# Patient Record
Sex: Female | Born: 1955 | Race: White | Hispanic: No | Marital: Single | Smoking: Former smoker
Health system: Southern US, Community
[De-identification: ages and names within clinical notes are randomized; demographics above are authoritative.]

## PROBLEM LIST (undated history)

## (undated) DIAGNOSIS — Z8619 Personal history of other infectious and parasitic diseases: Secondary | ICD-10-CM

## (undated) DIAGNOSIS — N200 Calculus of kidney: Secondary | ICD-10-CM

## (undated) DIAGNOSIS — F32A Depression, unspecified: Secondary | ICD-10-CM

## (undated) DIAGNOSIS — H9319 Tinnitus, unspecified ear: Secondary | ICD-10-CM

## (undated) DIAGNOSIS — H269 Unspecified cataract: Secondary | ICD-10-CM

## (undated) DIAGNOSIS — M1990 Unspecified osteoarthritis, unspecified site: Secondary | ICD-10-CM

## (undated) DIAGNOSIS — G4700 Insomnia, unspecified: Secondary | ICD-10-CM

## (undated) DIAGNOSIS — B182 Chronic viral hepatitis C: Secondary | ICD-10-CM

## (undated) DIAGNOSIS — F329 Major depressive disorder, single episode, unspecified: Secondary | ICD-10-CM

## (undated) DIAGNOSIS — F419 Anxiety disorder, unspecified: Secondary | ICD-10-CM

## (undated) HISTORY — PX: KIDNEY STONE SURGERY: SHX686

## (undated) HISTORY — DX: Anxiety disorder, unspecified: F41.9

## (undated) HISTORY — PX: CHOLECYSTECTOMY: SHX55

## (undated) HISTORY — DX: Insomnia, unspecified: G47.00

## (undated) HISTORY — PX: HEMORROIDECTOMY: SUR656

## (undated) HISTORY — PX: TEMPOROMANDIBULAR JOINT SURGERY: SHX35

## (undated) HISTORY — DX: Personal history of other infectious and parasitic diseases: Z86.19

---
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
## 2007-04-01 ENCOUNTER — Ambulatory Visit: Payer: Self-pay | Admitting: Obstetrics & Gynecology

## 2007-04-15 ENCOUNTER — Ambulatory Visit: Payer: Self-pay | Admitting: Gastroenterology

## 2008-01-29 ENCOUNTER — Emergency Department: Payer: Self-pay | Admitting: Emergency Medicine

## 2009-03-25 ENCOUNTER — Emergency Department: Payer: Self-pay | Admitting: Emergency Medicine

## 2009-03-31 ENCOUNTER — Ambulatory Visit: Payer: Self-pay | Admitting: Urology

## 2009-04-26 ENCOUNTER — Emergency Department: Payer: Self-pay | Admitting: Emergency Medicine

## 2009-05-09 ENCOUNTER — Emergency Department: Payer: Self-pay | Admitting: Emergency Medicine

## 2009-11-12 ENCOUNTER — Emergency Department: Payer: Self-pay | Admitting: Emergency Medicine

## 2010-02-02 ENCOUNTER — Ambulatory Visit: Payer: Self-pay

## 2010-04-25 DIAGNOSIS — B182 Chronic viral hepatitis C: Secondary | ICD-10-CM | POA: Insufficient documentation

## 2010-04-25 DIAGNOSIS — F32A Depression, unspecified: Secondary | ICD-10-CM | POA: Insufficient documentation

## 2010-04-25 HISTORY — DX: Chronic viral hepatitis C: B18.2

## 2010-06-22 ENCOUNTER — Emergency Department: Payer: Self-pay | Admitting: Emergency Medicine

## 2010-08-11 ENCOUNTER — Emergency Department: Payer: Self-pay | Admitting: Emergency Medicine

## 2010-09-15 ENCOUNTER — Emergency Department: Payer: Self-pay | Admitting: Emergency Medicine

## 2010-10-06 DIAGNOSIS — M75120 Complete rotator cuff tear or rupture of unspecified shoulder, not specified as traumatic: Secondary | ICD-10-CM | POA: Insufficient documentation

## 2011-04-11 DIAGNOSIS — M545 Low back pain, unspecified: Secondary | ICD-10-CM | POA: Insufficient documentation

## 2011-04-11 DIAGNOSIS — M542 Cervicalgia: Secondary | ICD-10-CM | POA: Insufficient documentation

## 2012-03-03 DIAGNOSIS — J069 Acute upper respiratory infection, unspecified: Secondary | ICD-10-CM | POA: Insufficient documentation

## 2012-03-03 HISTORY — DX: Acute upper respiratory infection, unspecified: J06.9

## 2012-11-18 ENCOUNTER — Emergency Department: Payer: Self-pay | Admitting: Emergency Medicine

## 2012-11-18 LAB — URINALYSIS, COMPLETE
Glucose,UR: NEGATIVE mg/dL (ref 0–75)
Ketone: NEGATIVE
Nitrite: NEGATIVE
Protein: 30
RBC,UR: 95 /HPF (ref 0–5)
Squamous Epithelial: 17
WBC UR: 38 /HPF (ref 0–5)

## 2012-11-18 LAB — COMPREHENSIVE METABOLIC PANEL
Alkaline Phosphatase: 90 U/L (ref 50–136)
Anion Gap: 4 — ABNORMAL LOW (ref 7–16)
BUN: 15 mg/dL (ref 7–18)
Bilirubin,Total: 0.6 mg/dL (ref 0.2–1.0)
Calcium, Total: 9.3 mg/dL (ref 8.5–10.1)
Chloride: 107 mmol/L (ref 98–107)
Co2: 30 mmol/L (ref 21–32)
Creatinine: 0.69 mg/dL (ref 0.60–1.30)
EGFR (Non-African Amer.): >60
Total Protein: 7.7 g/dL (ref 6.4–8.2)

## 2012-11-18 LAB — DRUG SCREEN, URINE
Amphetamines, Ur Screen: NEGATIVE (ref ?–1000)
Barbiturates, Ur Screen: NEGATIVE (ref ?–200)
Cannabinoid 50 Ng, Ur ~~LOC~~: NEGATIVE (ref ?–50)
Cocaine Metabolite,Ur ~~LOC~~: POSITIVE (ref ?–300)
Opiate, Ur Screen: NEGATIVE (ref ?–300)
Phencyclidine (PCP) Ur S: NEGATIVE (ref ?–25)

## 2012-11-18 LAB — CBC
HGB: 14.1 g/dL (ref 12.0–16.0)
MCH: 31.8 pg (ref 26.0–34.0)
MCHC: 33.8 g/dL (ref 32.0–36.0)
MCV: 94 fL (ref 80–100)
Platelet: 279 10*3/uL (ref 150–440)
RBC: 4.44 10*6/uL (ref 3.80–5.20)
RDW: 13.2 % (ref 11.5–14.5)

## 2012-11-18 LAB — ETHANOL: Ethanol %: <0.003 % (ref 0.000–0.080)

## 2012-12-10 ENCOUNTER — Ambulatory Visit: Payer: Self-pay | Admitting: Internal Medicine

## 2013-02-12 DIAGNOSIS — M19049 Primary osteoarthritis, unspecified hand: Secondary | ICD-10-CM | POA: Insufficient documentation

## 2013-03-14 ENCOUNTER — Ambulatory Visit: Payer: Self-pay | Admitting: Internal Medicine

## 2013-05-08 DIAGNOSIS — Z79890 Hormone replacement therapy (postmenopausal): Secondary | ICD-10-CM | POA: Insufficient documentation

## 2013-05-08 DIAGNOSIS — N951 Menopausal and female climacteric states: Secondary | ICD-10-CM | POA: Insufficient documentation

## 2013-05-08 DIAGNOSIS — N390 Urinary tract infection, site not specified: Secondary | ICD-10-CM | POA: Insufficient documentation

## 2013-12-26 DIAGNOSIS — F419 Anxiety disorder, unspecified: Secondary | ICD-10-CM | POA: Insufficient documentation

## 2014-03-21 ENCOUNTER — Emergency Department: Payer: Self-pay | Admitting: Internal Medicine

## 2014-11-06 NOTE — Consult Note (Signed)
PATIENT NAME:  Brooke Jenkins, Brooke Jenkins MR#:  [id number] DATE OF BIRTH:  [birth year]  DATE OF CONSULTATION:  11/19/2012  REFERRING PHYSICIAN:  Charlesetta Ivory, MD CONSULTING PHYSICIAN:  Cordelia Pen. Gretel Acre, MD  REASON FOR CONSULTATION: Depression.   CHIEF COMPLAINT: The patient is a 59 year old white female who presented to the Emergency Department requesting help. She reported "I cannot take this anymore, I take care of my 18 year old dad.  He has dementia. I cannot do it."  HISTORY OF PRESENT ILLNESS:  The patient is a 59 year old divorced female who presented to the ED after the IVC was taken out by her sister indicating that the patient has been abusing cocaine, about 10,000 dollars, in the past 3 months.  They reported the patient has been smashing out windows, writing on the walls and making threats to her sister. The patient reported that she takes care of her 62 year old father for the past 4 years. Reported that the father has been diagnosed with dementia and the sister who has recently moved in with them for the past 6 months is gone all day as she is currently a Pharmacist, hospital. The patient reported that she is unable to sleep at night and she is the primary caretaker of the father as he is unable to sleep at night and the patient is able to help him out with his daily chores. The patient reported that she cannot take it anymore as she has recently relapsed on cocaine and has been using heavily. She reported that she has been following with Dr. Kasandra Knudsen who has started heron new medications including giving her samples of antidepressant medications as well as giving her Xanax 1 mg 3 times a day. The patient reported that she feels that she is up all the time.  She cooks for the father as well as do his laundry. The patient reported that she fell 2 weeks ago and her memory was getting really bad. Reported that she gets cocaine from the drug dealer.  She reported "I have money." Reported that she gets disability for her back  as well as for depression of approximately 900 dollars per month. The patient reported that she is the baby of the family and lives with her "2 bitchy sisters." Reported that they think that she is an imbecile and is not able to take care of the father. Reported that the sister thinks that she is unable to take care of the father so they decided to bring her to the hospital. The patient appeared very disheveled during the interview and was unable to be calm and was very sleepy during the interview. She was unable to contract for safety at this time.   PAST PSYCHIATRIC HISTORY: The patient reported that she has been diagnosed with depression and has been following with Dr. Kasandra Knudsen at [facility] in [place]. Reported that she has been tried on several antidepressant medications including Wellbutrin, Paxil and Remeron which caused her constipation and he has recently started her on Fetzima. She was given samples of the medication. She reported that she is also getting Xanax 1 mg 3 times a day, but she does not take it 3 times a day.   ALLERGIES: CIPRO AND VICODIN.  MEDICAL PROBLEMS:  Significant weight loss.  SUBSTANCE ABUSE HISTORY: The patient has been using cocaine extensively and her last use was just before admission. She has spent approximately 10,000 dollars in the last 3 months.  Tobacco:  Smokes 1/2 pack of cigarettes per day.   FAMILY HISTORY: The  patient denied any history of psychiatric illness in her family.  CURRENT HOME MEDICATIONS: 1.  Pro Air 2.5 mg daily. 2.  Estradiol 1 mg daily. 3.  Xanax 1 mg 3 times daily. 4.  Fetzima samples.   REVIEW OF SYSTEMS:   GENERAL:  No fever or chills. Significant weight loss.  EYES: No double or blurred vision.  RESPIRATORY: No shortness of breath or cough.  CARDIOVASCULAR: No chest pain or orthopnea.  GASTROINTESTINAL: No abdominal pain, nausea, vomiting, diarrhea.  GENITOURINARY: No incontinence or frequency.  ENDOCRINE: No heat or cold intolerance.   LYMPHATIC: No anemia or easy bruising.  INTEGUMENTARY: No acne or rash.  MUSCULOSKELETAL: No muscle or joint pain.  NEUROLOGIC: No tingling or weakness.   CLINICAL SUMMARY:  Vital signs: Temperature 97.9, pulse 79, respirations 18, blood pressure 138/79.   LABORATORY DATA:  Glucose 97, BUN 15, creatinine 0.69, sodium 141, potassium 3.5, chloride 107, bicarbonate 30, anion gap 4, osmolality 282, calcium 9.3. Blood alcohol level less than 3. Protein 7.7, albumin 4.1, bilirubin 0.6, AST 63, ALT 80. TSH 1.07. Urine drug screen positive for benzodiazepines and cocaine. WBC 8.7, RBC 4.4, hemoglobin 14.1, hematocrit 41.8, platelet count 279, MCV 94, MCH 31.8.   MENTAL STATUS EXAMINATION: The patient is a skinny looking female who was very restless and sleepy during the interview. Her eye contact was fair. Her mood was anxious. Affect was congruent. Thought process was tangential. Thought content was nondelusional. She was unable to contract for safety at this time.  She denied having any perceptual disturbances.   DIAGNOSTIC IMPRESSION:  AXIS I: 1.  Major depressive disorder, recurrent, moderate.  2.  Cocaine dependence.   AXIS II: None.   AXIS III: Significant weight loss.   TREATMENT PLAN: 1.  The patient is currently under involuntary commitment.  2.  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  3.  She will be started on Zyprexa 2.5 mg p.o. b.i.d., Cogentin 0.5 mg p.o. b.i.d. and alprazolam 0.25 mg p.o. b.i.d. for her mood stabilization at this time.  4.  Treatment team to follow.   Thank you for allowing me to participate in the care of this patient.  ____________________________ Cordelia Pen. Gretel Acre, MD usf:sb D: 11/19/2012 13:46:33 ET T: 11/19/2012 14:09:28 ET JOB#: [id number]  cc: Cordelia Pen. Gretel Acre, MD, <Dictator> Jeronimo Norma MD ELECTRONICALLY SIGNED 11/22/2012 9:02

## 2014-12-17 ENCOUNTER — Emergency Department: Payer: Medicare Other

## 2014-12-17 ENCOUNTER — Emergency Department
Admission: EM | Admit: 2014-12-17 | Discharge: 2014-12-18 | Disposition: A | Discharge status: Home / Self Care | Payer: Medicare Other | Attending: Emergency Medicine | Admitting: Emergency Medicine

## 2014-12-17 DIAGNOSIS — W01190A Fall on same level from slipping, tripping and stumbling with subsequent striking against furniture, initial encounter: Secondary | ICD-10-CM | POA: Diagnosis not present

## 2014-12-17 DIAGNOSIS — Y998 Other external cause status: Secondary | ICD-10-CM | POA: Insufficient documentation

## 2014-12-17 DIAGNOSIS — Y9301 Activity, walking, marching and hiking: Secondary | ICD-10-CM | POA: Diagnosis not present

## 2014-12-17 DIAGNOSIS — Y92009 Unspecified place in unspecified non-institutional (private) residence as the place of occurrence of the external cause: Secondary | ICD-10-CM | POA: Insufficient documentation

## 2014-12-17 DIAGNOSIS — Z23 Encounter for immunization: Secondary | ICD-10-CM | POA: Insufficient documentation

## 2014-12-17 DIAGNOSIS — S51012A Laceration without foreign body of left elbow, initial encounter: Secondary | ICD-10-CM | POA: Insufficient documentation

## 2014-12-17 DIAGNOSIS — S59902A Unspecified injury of left elbow, initial encounter: Secondary | ICD-10-CM | POA: Diagnosis present

## 2014-12-17 MED ORDER — TETANUS-DIPHTH-ACELL PERTUSSIS 5-2.5-18.5 LF-MCG/0.5 IM SUSP
0.5000 mL | Freq: Once | INTRAMUSCULAR | Status: AC
  Administered 2014-12-17: 0.5 mL via INTRAMUSCULAR

## 2014-12-17 MED ORDER — TETANUS-DIPHTH-ACELL PERTUSSIS 5-2.5-18.5 LF-MCG/0.5 IM SUSP
INTRAMUSCULAR | Status: AC
  Administered 2014-12-17: 0.5 mL via INTRAMUSCULAR
  Filled 2014-12-17: qty 0.5

## 2014-12-17 MED ORDER — LIDOCAINE-EPINEPHRINE 2 %-1:100000 IJ SOLN: 20.0000 mL | Freq: Once | INTRAMUSCULAR | Status: AC

## 2014-12-17 MED ORDER — OXYCODONE-ACETAMINOPHEN 5-325 MG PO TABS
ORAL_TABLET | ORAL | Status: AC
  Administered 2014-12-17: 2 via ORAL
  Filled 2014-12-17: qty 2

## 2014-12-17 MED ORDER — LIDOCAINE-EPINEPHRINE (PF) 1 %-1:200000 IJ SOLN
INTRAMUSCULAR | Status: AC
  Administered 2014-12-17: 30 mL
  Filled 2014-12-17: qty 30

## 2014-12-17 MED ORDER — IBUPROFEN 600 MG PO TABS
ORAL_TABLET | ORAL | Status: AC
  Administered 2014-12-17: 600 mg
  Filled 2014-12-17: qty 1

## 2014-12-17 MED ORDER — OXYCODONE-ACETAMINOPHEN 5-325 MG PO TABS
2.0000 | ORAL_TABLET | Freq: Once | ORAL | Status: AC
  Administered 2014-12-17: 2 via ORAL

## 2014-12-17 NOTE — ED Notes (Signed)
Patient transported to X-ray 

## 2014-12-17 NOTE — ED Provider Notes (Signed)
CSN: [id number]     Arrival date & time 12/17/14  2151 History   First MD Initiated Contact with Patient 12/17/14 2204     Chief Complaint  Patient presents with  . Fall     (Consider location/radiation/quality/duration/timing/severity/associated sxs/prior Treatment) The history is provided by the patient.  Brooke Jenkins is a 59 y.o. female history of fibromyalgia here presenting with fall. Patient was walking in her house and accidentally tripped over something and hit the left elbow on a piece of furniture. She noticed bleeding from the left elbow afterwards. She did hit her head on the back but denies any headaches and is not currently on any blood thinners. Patient also has some right forearm pain as well. Denies syncope or loss of consciousness.    No past medical history on file. No past surgical history on file. No family history on file. History  Substance Use Topics  . Smoking status: Not on file  . Smokeless tobacco: Not on file  . Alcohol Use: Not on file   OB History    No data available     Review of Systems  Musculoskeletal:       L elbow pain   Skin: Positive for wound.  All other systems reviewed and are negative.     Allergies  Vicodin  Home Medications   Prior to Admission medications   Not on File   BP 149/77 mmHg  Pulse 77  Temp(Src) 97.8 F (36.6 C) (Oral)  Resp 18  Ht 5' (1.524 m)  Wt 105 lb (47.628 kg)  BMI 20.51 kg/m2  SpO2 98% Physical Exam  Constitutional: She is oriented to person, place, and time. She appears well-developed and well-nourished.  HENT:  Head: Normocephalic.  Mouth/Throat: Oropharynx is clear and moist.  Small posterior scalp hematoma. No active bleeding   Eyes: Conjunctivae are normal. Pupils are equal, round, and reactive to light.  Neck: Normal range of motion. Neck supple.  Cardiovascular: Normal rate, regular rhythm and normal heart sounds.   Pulmonary/Chest: Effort normal and breath sounds normal. No  respiratory distress. She has no wheezes. She has no rales.  Abdominal: Soft. Bowel sounds are normal. She exhibits no distension. There is no tenderness. There is no rebound.  Musculoskeletal: Normal range of motion.  L elbow with complex lacerations. One laceration around 1 in the other 2 inches, with active bleeding. Mild tenderness L forearm and R forearm, no obvious deformity.   Neurological: She is alert and oriented to person, place, and time. No cranial nerve deficit. Coordination normal.  Skin: Skin is warm and dry.  Psychiatric: She has a normal mood and affect. Her behavior is normal. Judgment and thought content normal.  Nursing note and vitals reviewed.   ED Course  Procedures (including critical care time) Labs Review Labs Reviewed - No data to display  Imaging Review No results found.   EKG Interpretation None      MDM   Final diagnoses:  None   [name] is a 59 y.o. female here with fall with laceration. Will get xray to r/o foreign body. Will need to suture laceration. Tdap updated.   10:58 PM Xray pending. Signed out to Dr. Owens Shark to f/u xray and suture laceration.   Brooke Arthurs, MD 12/17/14 2258

## 2014-12-17 NOTE — ED Notes (Signed)
[address] and hit left elbow on possible furniture large amt of bleeding noted to left elbow with large amt of bleeding.

## 2014-12-18 MED ORDER — KETOROLAC TROMETHAMINE 10 MG PO TABS
10.0000 mg | ORAL_TABLET | Freq: Once | ORAL | Status: AC
  Administered 2014-12-18: 10 mg via ORAL

## 2014-12-18 MED ORDER — KETOROLAC TROMETHAMINE 10 MG PO TABS
ORAL_TABLET | ORAL | Status: AC
  Administered 2014-12-18: 10 mg via ORAL
  Filled 2014-12-18: qty 1

## 2014-12-18 MED ORDER — IBUPROFEN 600 MG PO TABS: 600.0000 mg | ORAL_TABLET | Freq: Three times a day (TID) | ORAL | Status: DC | PRN

## 2014-12-18 NOTE — ED Notes (Signed)
Left elbow sutured by md.  Pt tolerated well.  Pressure bandage applied to left elbow.

## 2014-12-18 NOTE — ED Provider Notes (Signed)
Assume care from Dr. Darl Householder all x-rays that were for performed reveals no fracture or dislocation. Patient's wound was repaired. Procedure note: Approximately 3 inch laceration to the left elbow that was Y shaped cleaned with Betadine and normal saline anesthetized with 10 cc of 1% lidocaine with epinephrine. Wound repaired under sterile procedure with 5-0 nylon. Total number of stitches [place], MD 12/18/14 [phone]

## 2014-12-18 NOTE — Discharge Instructions (Signed)

## 2015-12-07 DIAGNOSIS — G4700 Insomnia, unspecified: Secondary | ICD-10-CM | POA: Insufficient documentation

## 2015-12-07 DIAGNOSIS — F5104 Psychophysiologic insomnia: Secondary | ICD-10-CM | POA: Insufficient documentation

## 2015-12-07 DIAGNOSIS — K5904 Chronic idiopathic constipation: Secondary | ICD-10-CM | POA: Insufficient documentation

## 2015-12-07 DIAGNOSIS — F3341 Major depressive disorder, recurrent, in partial remission: Secondary | ICD-10-CM | POA: Insufficient documentation

## 2015-12-07 DIAGNOSIS — K219 Gastro-esophageal reflux disease without esophagitis: Secondary | ICD-10-CM | POA: Insufficient documentation

## 2015-12-07 DIAGNOSIS — F17200 Nicotine dependence, unspecified, uncomplicated: Secondary | ICD-10-CM | POA: Insufficient documentation

## 2015-12-10 DIAGNOSIS — F988 Other specified behavioral and emotional disorders with onset usually occurring in childhood and adolescence: Secondary | ICD-10-CM | POA: Insufficient documentation

## 2016-01-12 DIAGNOSIS — M5030 Other cervical disc degeneration, unspecified cervical region: Secondary | ICD-10-CM | POA: Insufficient documentation

## 2016-01-12 DIAGNOSIS — H9313 Tinnitus, bilateral: Secondary | ICD-10-CM | POA: Insufficient documentation

## 2016-02-17 ENCOUNTER — Other Ambulatory Visit (HOSPITAL_COMMUNITY): Payer: Self-pay | Admitting: Nurse Practitioner

## 2016-02-17 ENCOUNTER — Other Ambulatory Visit: Payer: Self-pay | Admitting: Nurse Practitioner

## 2016-02-17 DIAGNOSIS — R1310 Dysphagia, unspecified: Secondary | ICD-10-CM

## 2016-02-24 ENCOUNTER — Ambulatory Visit: Payer: Medicare Other

## 2016-03-02 ENCOUNTER — Ambulatory Visit
Admission: RE | Admit: 2016-03-02 | Discharge: 2016-03-02 | Disposition: A | Discharge status: Home / Self Care | Payer: Medicare Other | Source: Ambulatory Visit | Attending: Nurse Practitioner | Admitting: Nurse Practitioner

## 2016-03-02 DIAGNOSIS — R1310 Dysphagia, unspecified: Secondary | ICD-10-CM

## 2016-03-02 DIAGNOSIS — K5909 Other constipation: Secondary | ICD-10-CM | POA: Diagnosis present

## 2016-03-28 DIAGNOSIS — Z87442 Personal history of urinary calculi: Secondary | ICD-10-CM

## 2016-03-28 DIAGNOSIS — N200 Calculus of kidney: Secondary | ICD-10-CM | POA: Insufficient documentation

## 2016-03-28 HISTORY — DX: Personal history of urinary calculi: Z87.442

## 2016-05-15 ENCOUNTER — Ambulatory Visit: Admit: 2016-05-15 | Payer: Medicare Other | Admitting: Gastroenterology

## 2016-05-15 SURGERY — ESOPHAGOGASTRODUODENOSCOPY (EGD) WITH PROPOFOL
Anesthesia: General

## 2016-10-09 ENCOUNTER — Emergency Department
Admission: EM | Admit: 2016-10-09 | Discharge: 2016-10-09 | Disposition: A | Discharge status: Home / Self Care | Payer: Medicare Other | Attending: Emergency Medicine | Admitting: Emergency Medicine

## 2016-10-09 ENCOUNTER — Encounter: Payer: Self-pay | Admitting: *Deleted

## 2016-10-09 DIAGNOSIS — R1031 Right lower quadrant pain: Secondary | ICD-10-CM | POA: Diagnosis not present

## 2016-10-09 DIAGNOSIS — F17200 Nicotine dependence, unspecified, uncomplicated: Secondary | ICD-10-CM | POA: Insufficient documentation

## 2016-10-09 DIAGNOSIS — Z5321 Procedure and treatment not carried out due to patient leaving prior to being seen by health care provider: Secondary | ICD-10-CM | POA: Diagnosis not present

## 2016-10-09 LAB — COMPREHENSIVE METABOLIC PANEL
ALT: 14 U/L (ref 14–54)
ANION GAP: 6 (ref 5–15)
AST: 29 U/L (ref 15–41)
Albumin: 3.9 g/dL (ref 3.5–5.0)
Alkaline Phosphatase: 78 U/L (ref 38–126)
BUN: 9 mg/dL (ref 6–20)
CALCIUM: 8.8 mg/dL — AB (ref 8.9–10.3)
CO2: 29 mmol/L (ref 22–32)
CREATININE: 0.66 mg/dL (ref 0.44–1.00)
Chloride: 106 mmol/L (ref 101–111)
GFR calc non Af Amer: >60 mL/min (ref >60)
Glucose, Bld: 99 mg/dL (ref 65–99)
Potassium: 4.3 mmol/L (ref 3.5–5.1)
SODIUM: 141 mmol/L (ref 135–145)
Total Bilirubin: 0.5 mg/dL (ref 0.3–1.2)
Total Protein: 7.2 g/dL (ref 6.5–8.1)

## 2016-10-09 LAB — CBC
HCT: 38.8 % (ref 35.0–47.0)
HEMOGLOBIN: 13.1 g/dL (ref 12.0–16.0)
MCH: 32.6 pg (ref 26.0–34.0)
MCHC: 33.8 g/dL (ref 32.0–36.0)
MCV: 96.5 fL (ref 80.0–100.0)
Platelets: 229 10*3/uL (ref 150–440)
RBC: 4.01 MIL/uL (ref 3.80–5.20)
RDW: 13 % (ref 11.5–14.5)
WBC: 6.4 10*3/uL (ref 3.6–11.0)

## 2016-10-09 LAB — URINALYSIS, COMPLETE (UACMP) WITH MICROSCOPIC
BILIRUBIN URINE: NEGATIVE
Bacteria, UA: NONE SEEN
Glucose, UA: NEGATIVE mg/dL
KETONES UR: NEGATIVE mg/dL
Leukocytes, UA: NEGATIVE
Nitrite: NEGATIVE
Protein, ur: NEGATIVE mg/dL
SPECIFIC GRAVITY, URINE: 1.003 — AB (ref 1.005–1.030)
Squamous Epithelial / LPF: NONE SEEN
pH: 7 (ref 5.0–8.0)

## 2016-10-09 LAB — LIPASE, BLOOD: Lipase: 14 U/L (ref 11–51)

## 2016-10-09 NOTE — ED Triage Notes (Signed)
Pt has right side abd pain for 1 week.  Pt states recent kidney stone but pain is different.  Pain worse today in right lower abd.  No back pain.  No n/v/

## 2016-10-10 ENCOUNTER — Telehealth: Payer: Self-pay | Admitting: Emergency Medicine

## 2016-10-10 NOTE — Telephone Encounter (Signed)
Called patient due to lwot to inquire about condition and follow up plans. Left message.   

## 2017-05-17 ENCOUNTER — Encounter: Payer: Self-pay | Admitting: Emergency Medicine

## 2017-05-17 ENCOUNTER — Emergency Department
Admission: EM | Admit: 2017-05-17 | Discharge: 2017-05-17 | Disposition: A | Discharge status: Home / Self Care | Payer: Medicare Other | Attending: Emergency Medicine | Admitting: Emergency Medicine

## 2017-05-17 ENCOUNTER — Emergency Department: Payer: Medicare Other

## 2017-05-17 DIAGNOSIS — F17200 Nicotine dependence, unspecified, uncomplicated: Secondary | ICD-10-CM | POA: Diagnosis not present

## 2017-05-17 DIAGNOSIS — G43009 Migraine without aura, not intractable, without status migrainosus: Secondary | ICD-10-CM

## 2017-05-17 DIAGNOSIS — Y9389 Activity, other specified: Secondary | ICD-10-CM | POA: Insufficient documentation

## 2017-05-17 DIAGNOSIS — Y998 Other external cause status: Secondary | ICD-10-CM | POA: Insufficient documentation

## 2017-05-17 DIAGNOSIS — Y92000 Kitchen of unspecified non-institutional (private) residence as  the place of occurrence of the external cause: Secondary | ICD-10-CM | POA: Insufficient documentation

## 2017-05-17 DIAGNOSIS — F329 Major depressive disorder, single episode, unspecified: Secondary | ICD-10-CM | POA: Diagnosis not present

## 2017-05-17 DIAGNOSIS — S060X9A Concussion with loss of consciousness of unspecified duration, initial encounter: Secondary | ICD-10-CM

## 2017-05-17 DIAGNOSIS — W228XXA Striking against or struck by other objects, initial encounter: Secondary | ICD-10-CM | POA: Diagnosis not present

## 2017-05-17 DIAGNOSIS — S0990XA Unspecified injury of head, initial encounter: Secondary | ICD-10-CM | POA: Diagnosis present

## 2017-05-17 DIAGNOSIS — Z79899 Other long term (current) drug therapy: Secondary | ICD-10-CM | POA: Insufficient documentation

## 2017-05-17 HISTORY — DX: Unspecified cataract: H26.9

## 2017-05-17 HISTORY — DX: Depression, unspecified: F32.A

## 2017-05-17 HISTORY — DX: Unspecified osteoarthritis, unspecified site: M19.90

## 2017-05-17 HISTORY — DX: Tinnitus, unspecified ear: H93.19

## 2017-05-17 HISTORY — DX: Major depressive disorder, single episode, unspecified: F32.9

## 2017-05-17 MED ORDER — KETOROLAC TROMETHAMINE 30 MG/ML IJ SOLN
30.0000 mg | Freq: Once | INTRAMUSCULAR | Status: AC
  Administered 2017-05-17: 30 mg via INTRAVENOUS
  Filled 2017-05-17: qty 1

## 2017-05-17 MED ORDER — DIPHENHYDRAMINE HCL 50 MG/ML IJ SOLN
50.0000 mg | Freq: Once | INTRAMUSCULAR | Status: AC
  Administered 2017-05-17: 50 mg via INTRAVENOUS
  Filled 2017-05-17: qty 1

## 2017-05-17 MED ORDER — METOCLOPRAMIDE HCL 5 MG/ML IJ SOLN
10.0000 mg | Freq: Once | INTRAMUSCULAR | Status: AC
  Administered 2017-05-17: 10 mg via INTRAVENOUS
  Filled 2017-05-17: qty 2

## 2017-05-17 MED ORDER — SODIUM CHLORIDE 0.9 % IV BOLUS (SEPSIS)
1000.0000 mL | Freq: Once | INTRAVENOUS | Status: AC
  Administered 2017-05-17: 1000 mL via INTRAVENOUS

## 2017-05-17 NOTE — ED Provider Notes (Signed)
[facility] Emergency Department Provider Note  Time seen: 5:06 PM  I have reviewed the triage vital signs and the nursing notes.   HISTORY  Chief Complaint Head Injury; Headache; and Nausea    HPI Brooke Jenkins is a 61 y.o. female with a past medical history of cataracts, depression, presents to the emergency department for headache.  According to the patient proximate 10 days ago she was putting away things in the refrigerator when she stood up hitting the top of her head on the freezer door.  Denies LOC but states significant pain with a large sized hematoma.  Patient states since that time she has had progressively worsening headache currently rates her headache is a 9/10.  Positive for photophobia.  Patient has a history of migraines but states she has not had a migraine in many years.  Denies any weakness or numbness.  Past Medical History:  Diagnosis Date  . Arthritis   . Cataracts, bilateral   . Depression   . Tinnitus     There are no active problems to display for this patient.   No past surgical history on file.  Prior to Admission medications   Medication Sig Start Date End Date Taking? Authorizing Provider  ibuprofen (ADVIL,MOTRIN) 600 MG tablet Take 1 tablet (600 mg total) by mouth every 8 (eight) hours as needed. 12/18/14   Gregor Hams, MD  lamoTRIgine (LAMICTAL) 150 MG tablet Take 150 mg by mouth daily.    [provider]  methylphenidate (RITALIN) 20 MG tablet Take 20 mg by mouth.    [provider]  omeprazole (PRILOSEC) 20 MG capsule Take 20 mg by mouth daily.    [provider]    Allergies  Allergen Reactions  . Ciprofloxacin Nausea And Vomiting  . Vicodin [Hydrocodone-Acetaminophen] Itching    No family history on file.  Social History Social History  Substance Use Topics  . Smoking status: Current Every Day Smoker  . Smokeless tobacco: Never Used  . Alcohol use No    Review of  Systems Constitutional: Negative for LOC. Cardiovascular: Negative for chest pain. Respiratory: Negative for shortness of breath. Gastrointestinal: Negative for abdominal pain Skin: States hematoma to the back of her head which has since resolved Neurological: Significant headache.  Denies focal weakness or numbness. All other ROS negative  ____________________________________________   PHYSICAL EXAM:  VITAL SIGNS: ED Triage Vitals  Enc Vitals Group     BP 05/17/17 1455 (!) 144/70     Pulse Rate 05/17/17 1455 (!) 58     Resp 05/17/17 1455 16     Temp 05/17/17 1455 98.2 F (36.8 C)     Temp Source 05/17/17 1455 Oral     SpO2 05/17/17 1455 98 %     Weight 05/17/17 1456 105 lb (47.6 kg)     Height 05/17/17 1456 5' (1.524 m)     Head Circumference --      Peak Flow --      Pain Score 05/17/17 1458 9     Pain Loc --      Pain Edu? --      Excl. in [place]? --    Constitutional: Alert and oriented. Well appearing and in no distress. Eyes: Normal exam ENT   Head: Normocephalic and atraumatic.  No hematoma palpated.   Mouth/Throat: Mucous membranes are moist. Cardiovascular: Normal rate, regular rhythm. No murmur Respiratory: Normal respiratory effort without tachypnea nor retractions. Breath sounds are clear Gastrointestinal: Soft and nontender.  No distention. Musculoskeletal: Nontender with normal range of motion in all extremities Neurologic:  Normal speech and language. No gross focal neurologic deficits.  Equal grip strength.  No pronator drift.  Cranial nerves appear intact Skin:  Skin is warm, dry and intact.  Psychiatric: Mood and affect are normal.   ____________________________________________    RADIOLOGY  CT scans are negative  INITIAL IMPRESSION / ASSESSMENT AND PLAN / ED COURSE  Pertinent labs & imaging results that were available during my care of the patient were reviewed by me and considered in my medical decision making (see chart for  details).  Patient presents the emergency department with significant headache over the past 10 days which has progressively worsened.  Differential would include concussion, intracranial hemorrhage, headache, migraine headache.  Patient CT scans of the head and neck are essentially negative/normal.  Given the patient's history of migraines patient possibly has a migraine induced from the head injury or possible concussive syndrome.  Overall the patient appears well, no neurological deficits on exam.  We will treat with Toradol, Reglan, Benadryl and IV fluids and reassess.  Patient agreeable with this plan of care.  Patient states significant improvement in the headache, she is feeling better.  We will discharge home with PCP follow-up.  Patient agreeable to this plan of care.  ____________________________________________   FINAL CLINICAL IMPRESSION(S) / ED DIAGNOSES  Head injury Headache    Harvest Dark, MD 05/17/17 1939

## 2017-05-17 NOTE — ED Notes (Signed)
Pt ambulatory to toilet with steady gait noted. No assistance needed.

## 2017-05-17 NOTE — ED Triage Notes (Addendum)
Pt reports hit her head on her freezer door ten days ago. Denies LOC but reports the pain brought her to her knees. Pt reports has had headache and nausea since. Pt alert and oriented in triage. Denies taking blood thinners.

## 2017-05-17 NOTE — ED Notes (Signed)
Pt states she feels dizzy and lightheaded when standing. Pt feels like she needs to hold something when standing to stabilize herself and maintain balance.

## 2017-10-29 DIAGNOSIS — G8929 Other chronic pain: Secondary | ICD-10-CM | POA: Insufficient documentation

## 2017-11-06 DIAGNOSIS — M94269 Chondromalacia, unspecified knee: Secondary | ICD-10-CM | POA: Insufficient documentation

## 2017-11-13 DIAGNOSIS — J301 Allergic rhinitis due to pollen: Secondary | ICD-10-CM | POA: Insufficient documentation

## 2018-08-08 ENCOUNTER — Other Ambulatory Visit: Payer: Self-pay | Admitting: Sports Medicine

## 2018-08-08 DIAGNOSIS — M222X2 Patellofemoral disorders, left knee: Secondary | ICD-10-CM

## 2018-08-08 DIAGNOSIS — M94261 Chondromalacia, right knee: Secondary | ICD-10-CM

## 2018-08-08 DIAGNOSIS — M7651 Patellar tendinitis, right knee: Secondary | ICD-10-CM

## 2018-08-08 DIAGNOSIS — G8929 Other chronic pain: Secondary | ICD-10-CM

## 2018-08-08 DIAGNOSIS — M7652 Patellar tendinitis, left knee: Secondary | ICD-10-CM

## 2018-08-08 DIAGNOSIS — M25561 Pain in right knee: Principal | ICD-10-CM

## 2018-08-08 DIAGNOSIS — M222X1 Patellofemoral disorders, right knee: Secondary | ICD-10-CM

## 2018-09-10 ENCOUNTER — Other Ambulatory Visit: Payer: Self-pay | Admitting: Medical Oncology

## 2018-09-10 DIAGNOSIS — R399 Unspecified symptoms and signs involving the genitourinary system: Secondary | ICD-10-CM

## 2018-09-10 DIAGNOSIS — N200 Calculus of kidney: Secondary | ICD-10-CM

## 2018-09-26 ENCOUNTER — Other Ambulatory Visit: Payer: Self-pay

## 2018-09-26 ENCOUNTER — Ambulatory Visit
Admission: RE | Admit: 2018-09-26 | Discharge: 2018-09-26 | Disposition: A | Discharge status: Home / Self Care | Payer: Medicare HMO | Source: Ambulatory Visit | Attending: Medical Oncology | Admitting: Medical Oncology

## 2018-09-26 DIAGNOSIS — N200 Calculus of kidney: Secondary | ICD-10-CM | POA: Diagnosis present

## 2018-09-26 DIAGNOSIS — R399 Unspecified symptoms and signs involving the genitourinary system: Secondary | ICD-10-CM | POA: Insufficient documentation

## 2018-09-27 ENCOUNTER — Other Ambulatory Visit: Payer: Self-pay | Admitting: Pediatrics

## 2018-09-27 DIAGNOSIS — R102 Pelvic and perineal pain: Secondary | ICD-10-CM

## 2018-09-27 DIAGNOSIS — R140 Abdominal distension (gaseous): Secondary | ICD-10-CM

## 2018-09-27 DIAGNOSIS — N951 Menopausal and female climacteric states: Secondary | ICD-10-CM

## 2018-10-01 ENCOUNTER — Other Ambulatory Visit: Payer: Self-pay | Admitting: Sports Medicine

## 2018-10-01 DIAGNOSIS — M94262 Chondromalacia, left knee: Secondary | ICD-10-CM

## 2018-10-01 DIAGNOSIS — M25562 Pain in left knee: Principal | ICD-10-CM

## 2018-10-01 DIAGNOSIS — G8929 Other chronic pain: Secondary | ICD-10-CM

## 2018-10-07 ENCOUNTER — Other Ambulatory Visit: Payer: Self-pay | Admitting: Physical Medicine and Rehabilitation

## 2018-10-07 DIAGNOSIS — M5416 Radiculopathy, lumbar region: Secondary | ICD-10-CM

## 2018-10-07 DIAGNOSIS — M5412 Radiculopathy, cervical region: Secondary | ICD-10-CM

## 2018-10-08 ENCOUNTER — Ambulatory Visit: Payer: Medicare HMO

## 2018-10-10 ENCOUNTER — Ambulatory Visit: Payer: Medicare HMO

## 2018-10-11 ENCOUNTER — Other Ambulatory Visit: Payer: Self-pay

## 2018-10-11 ENCOUNTER — Ambulatory Visit
Admission: RE | Admit: 2018-10-11 | Discharge: 2018-10-11 | Disposition: A | Discharge status: Home / Self Care | Payer: Medicare HMO | Source: Ambulatory Visit | Attending: Pediatrics | Admitting: Pediatrics

## 2018-10-11 DIAGNOSIS — R140 Abdominal distension (gaseous): Secondary | ICD-10-CM | POA: Diagnosis present

## 2018-10-11 DIAGNOSIS — R102 Pelvic and perineal pain: Secondary | ICD-10-CM | POA: Diagnosis present

## 2018-10-18 DIAGNOSIS — N952 Postmenopausal atrophic vaginitis: Secondary | ICD-10-CM | POA: Insufficient documentation

## 2018-10-30 ENCOUNTER — Telehealth: Payer: Self-pay | Admitting: Urology

## 2018-10-30 NOTE — Telephone Encounter (Signed)
Spoke with Dr. Erlene Quan ok to move appointment up to next week, patient was notified and appointment was rescheduled

## 2018-10-30 NOTE — Telephone Encounter (Signed)
Pt thinks she has kidney stones.  She cannot empty her bladder.  She is miserable and would like to come in sooner for her appt.  She goes to bathroom every 10 minutes, she can't sleep, she can't relax and has to force urine out and bear down.

## 2018-11-05 ENCOUNTER — Other Ambulatory Visit: Payer: Self-pay

## 2018-11-05 ENCOUNTER — Ambulatory Visit
Admission: RE | Admit: 2018-11-05 | Discharge: 2018-11-05 | Disposition: A | Discharge status: Home / Self Care | Payer: Medicare HMO | Source: Ambulatory Visit | Attending: Urology | Admitting: Urology

## 2018-11-05 ENCOUNTER — Encounter: Payer: Self-pay | Admitting: Urology

## 2018-11-05 ENCOUNTER — Ambulatory Visit: Payer: Medicare Other

## 2018-11-05 ENCOUNTER — Ambulatory Visit
Admission: RE | Admit: 2018-11-05 | Discharge: 2018-11-05 | Disposition: A | Discharge status: Home / Self Care | Payer: Medicare HMO | Attending: Urology | Admitting: Urology

## 2018-11-05 ENCOUNTER — Ambulatory Visit (INDEPENDENT_AMBULATORY_CARE_PROVIDER_SITE_OTHER): Payer: Medicare HMO | Admitting: Urology

## 2018-11-05 VITALS — BP 128/89 | HR 80 | Ht 60.0 in | Wt 121.6 lb

## 2018-11-05 DIAGNOSIS — N1330 Unspecified hydronephrosis: Secondary | ICD-10-CM

## 2018-11-05 DIAGNOSIS — Z87442 Personal history of urinary calculi: Secondary | ICD-10-CM

## 2018-11-05 DIAGNOSIS — R300 Dysuria: Secondary | ICD-10-CM | POA: Diagnosis not present

## 2018-11-05 DIAGNOSIS — N200 Calculus of kidney: Secondary | ICD-10-CM | POA: Diagnosis not present

## 2018-11-05 DIAGNOSIS — R102 Pelvic and perineal pain: Secondary | ICD-10-CM

## 2018-11-05 DIAGNOSIS — R350 Frequency of micturition: Secondary | ICD-10-CM | POA: Diagnosis not present

## 2018-11-05 DIAGNOSIS — F17200 Nicotine dependence, unspecified, uncomplicated: Secondary | ICD-10-CM | POA: Insufficient documentation

## 2018-11-05 LAB — URINALYSIS, COMPLETE
Bilirubin, UA: NEGATIVE
Glucose, UA: NEGATIVE
Ketones, UA: NEGATIVE
Nitrite, UA: NEGATIVE
Protein,UA: NEGATIVE
Specific Gravity, UA: 1.02 (ref 1.005–1.030)
Urobilinogen, Ur: 0.2 mg/dL (ref 0.2–1.0)
pH, UA: 6.5 (ref 5.0–7.5)

## 2018-11-05 LAB — BLADDER SCAN AMB NON-IMAGING

## 2018-11-05 LAB — MICROSCOPIC EXAMINATION

## 2018-11-05 MED ORDER — OXYCODONE-ACETAMINOPHEN 5-325 MG PO TABS: 1.0000 | ORAL_TABLET | Freq: Four times a day (QID) | ORAL | 0 refills | Status: DC | PRN

## 2018-11-05 MED ORDER — SULFAMETHOXAZOLE-TRIMETHOPRIM 800-160 MG PO TABS: 1.0000 | ORAL_TABLET | Freq: Two times a day (BID) | ORAL | 0 refills | Status: DC

## 2018-11-05 MED ORDER — TAMSULOSIN HCL 0.4 MG PO CAPS: 0.4000 mg | ORAL_CAPSULE | Freq: Every day | ORAL | 0 refills | Status: DC

## 2018-11-05 NOTE — Progress Notes (Signed)
11/05/2018 3:46 PM   Brooke Jenkins 09-25-1955 [id number]  Referring provider: Barbaraann Boys, [place], [place] [address]  Chief complaint: Pelvic pain, urinary difficulty   HPI: Brooke Jenkins is a 63 year old female seen in consultation at the request of Dr. Janene Harvey for evaluation of pelvic pain and nephrolithiasis.  She presents with approximately 6-week history of pelvic pain/pressure associated with urinary frequency, urgency and sensation of incomplete bladder emptying.  She also complains of pelvic pain primarily in the midline but occasionally radiating to the right side.  Her symptoms have worsened over the last several days.  She did have an ultrasound performed on 3/20 which showed bilateral nephrolithiasis and mild left hydronephrosis.  For the past several days she complains of a constant need to void and extreme constant pressure in the bladder area.  She has a prior history of stone disease.  Duration: 6 weeks Precipitating factors: There are no precipitating factors Aggravating and alleviating factors: There are no aggravating or alleviating factors Modifying factors: There are no modifying factors Associated signs and symptoms: Nausea frequency, urgency Severity: Severe  She denies fever or chills.  Past urologic history remarkable for a PCNL around 2007 and ureteroscopic stone removal in 2018.  PMH: Past Medical History:  Diagnosis Date  . Anxiety   . Arthritis   . Cataracts, bilateral   . Depression   . History of hepatitis   . Insomnia   . Tinnitus     Surgical History: Disc surgery Kidney stone surgery Cholecystectomy Hemorrhoidectomy  Home Medications:  Allergies as of 11/05/2018      Reactions   Amoxicillin-pot Clavulanate Nausea And Vomiting   Aspirin    Other reaction(s): Unknown   Ciprofloxacin Nausea And Vomiting   Tramadol    Other reaction(s): Vomiting   Vicodin [hydrocodone-acetaminophen] Itching       Medication List       Accurate as of November 05, 2018  3:46 PM. Always use your most recent med list.        ALPRAZolam 1 MG tablet Commonly known as:  XANAX Take by mouth.   cetirizine 10 MG tablet Commonly known as:  ZYRTEC Take by mouth.   cloNIDine 0.2 MG tablet Commonly known as:  CATAPRES Take by mouth.   CRANBERRY PO Take by mouth.   doxepin 25 MG capsule Commonly known as:  SINEQUAN Take by mouth.   gabapentin 300 MG capsule Commonly known as:  NEURONTIN 1 po qHS   hydrOXYzine 25 MG tablet Commonly known as:  ATARAX/VISTARIL   ibuprofen 600 MG tablet Commonly known as:  ADVIL Take 1 tablet (600 mg total) by mouth every 8 (eight) hours as needed.   lamoTRIgine 100 MG tablet Commonly known as:  LAMICTAL   meloxicam 15 MG tablet Commonly known as:  MOBIC Take by mouth.   methocarbamol 500 MG tablet Commonly known as:  ROBAXIN   methylphenidate 20 MG tablet Commonly known as:  RITALIN Take by mouth.   methylPREDNISolone 4 MG Tbpk tablet Commonly known as:  MEDROL DOSEPAK FPD   OLANZapine 5 MG tablet Commonly known as:  ZYPREXA   omeprazole 20 MG capsule Commonly known as:  PRILOSEC Take 20 mg by mouth daily.   Premarin vaginal cream Generic drug:  conjugated estrogens PLACE 0.5 GRAM VAGINALLY QD   SUMAtriptan 50 MG tablet Commonly known as:  IMITREX Take by mouth.   tamsulosin 0.4 MG Caps capsule Commonly known as:  FLOMAX TK ONE C PO  ONCE D 30 MINUTES AFTER THE SAME MEAL EACH DAY   zaleplon 10 MG capsule Commonly known as:  SONATA       Allergies:  Allergies  Allergen Reactions  . Amoxicillin-Pot Clavulanate Nausea And Vomiting  . Aspirin     Other reaction(s): Unknown  . Ciprofloxacin Nausea And Vomiting  . Tramadol     Other reaction(s): Vomiting  . Vicodin [Hydrocodone-Acetaminophen] Itching    Family History: No family history on file.  Social History:  reports that she has been smoking. She has smoked for the past  40.00 years. She has never used smokeless tobacco. She reports that she does not drink alcohol. No history on file for drug.  ROS: UROLOGY Frequent Urination?: Yes Hard to postpone urination?: No Burning/pain with urination?: No Get up at night to urinate?: No Leakage of urine?: No Urine stream starts and stops?: No Trouble starting stream?: No Do you have to strain to urinate?: Yes Blood in urine?: Yes Urinary tract infection?: No Sexually transmitted disease?: No Injury to kidneys or bladder?: No Painful intercourse?: No Weak stream?: No Currently pregnant?: No Vaginal bleeding?: No Last menstrual period?: Postmenopausal  Gastrointestinal Nausea?: Yes Vomiting?: No Indigestion/heartburn?: No Diarrhea?: No Constipation?: Yes  Constitutional Fever: No Night sweats?: No Weight loss?: No Fatigue?: Yes  Skin Skin rash/lesions?: No Itching?: No  Eyes Blurred vision?: No Double vision?: No  Ears/Nose/Throat Sore throat?: No Sinus problems?: Yes  Hematologic/Lymphatic Swollen glands?: No Easy bruising?: No  Cardiovascular Leg swelling?: No Chest pain?: No  Respiratory Cough?: No Shortness of breath?: No  Endocrine Excessive thirst?: No  Musculoskeletal Back pain?: Yes Joint pain?: Yes  Neurological Headaches?: Yes Dizziness?: Yes  Psychologic Depression?: Yes Anxiety?: Yes  Physical Exam: BP 128/89 (BP Location: Left Arm, Patient Position: Sitting, Cuff Size: Normal)   Pulse 80   Ht 5' (1.524 m)   Wt 121 lb 9.6 oz (55.2 kg)   BMI 23.75 kg/m   Constitutional:  Alert and oriented, No acute distress. HEENT: [place] AT, moist mucus membranes.  Trachea midline, no masses. Cardiovascular: No clubbing, cyanosis, or edema. Respiratory: Normal respiratory effort, no increased work of breathing. Skin: No rashes, bruises or suspicious lesions. Neurologic: Grossly intact, no focal deficits, moving all 4 extremities. Psychiatric: Normal mood and affect.   Laboratory Data:  Urinalysis: Dipstick 1+ blood/1+ leukocytes Microscopy: 6-10 WBC/3-10 RBC/calcium oxalate crystals   Pertinent Imaging: KUB performed today was reviewed.  There are calcifications in the left true bony pelvis consistent with phleboliths.   Assessment & Plan:   63 year old female with pelvic pain and ultrasound showing mild left hydronephrosis.  Urinalysis today does show microhematuria and pyuria as well as calcium oxalate crystals.  A urine culture was ordered.  She most likely has a distal ureteral calculus.  -Rx tamsulosin was sent to pharmacy -She was given Myrbetriq samples 50 mg daily -She is having increased pelvic pain and Rx Percocet was sent to pharmacy -We will start on Septra DS pending her urine culture. -Stone protocol CT of the abdomen pelvis was ordered and she will be notified with results.   Abbie Sons, [place] [address], [place], [place] [address] [phone]

## 2018-11-06 ENCOUNTER — Ambulatory Visit: Payer: Self-pay | Admitting: Urology

## 2018-11-07 ENCOUNTER — Ambulatory Visit: Payer: Medicare Other

## 2018-11-07 ENCOUNTER — Other Ambulatory Visit: Payer: Medicare Other

## 2018-11-07 ENCOUNTER — Telehealth: Payer: Self-pay | Admitting: *Deleted

## 2018-11-07 LAB — CULTURE, URINE COMPREHENSIVE

## 2018-11-07 NOTE — Telephone Encounter (Signed)
Informed patient as directed

## 2018-11-07 NOTE — Telephone Encounter (Signed)
Informed patient as instructed-verbalized understanding. She is still in some pain and experiencing chills.    Notes recorded by Abbie Sons, MD on 11/07/2018 at 11:48 AM EDT Urine culture was negative for infection

## 2018-11-07 NOTE — Telephone Encounter (Signed)
Awaiting scheduling of her CT scan by radiology.  If symptoms worsen or she develops fever would recommend she be evaluated in the ED.

## 2018-11-10 ENCOUNTER — Ambulatory Visit
Admission: RE | Admit: 2018-11-10 | Discharge: 2018-11-10 | Disposition: A | Discharge status: Home / Self Care | Payer: Medicare HMO | Source: Ambulatory Visit | Attending: Physical Medicine and Rehabilitation | Admitting: Physical Medicine and Rehabilitation

## 2018-11-10 ENCOUNTER — Other Ambulatory Visit: Payer: Self-pay

## 2018-11-10 ENCOUNTER — Ambulatory Visit: Payer: Medicare Other

## 2018-11-10 DIAGNOSIS — M5412 Radiculopathy, cervical region: Secondary | ICD-10-CM

## 2018-11-10 DIAGNOSIS — M5416 Radiculopathy, lumbar region: Secondary | ICD-10-CM

## 2018-11-11 ENCOUNTER — Ambulatory Visit: Payer: Medicare Other

## 2018-11-12 ENCOUNTER — Telehealth: Payer: Self-pay | Admitting: Urology

## 2018-11-12 NOTE — Telephone Encounter (Signed)
Patient is requesting to be seen by a female provider.  She saw Dr. Bernardo Heater on 11/05/18 due to increasing pain and pressure.  She is scheduled for a CT scan on 11/19/18 and wanting to schedule appt with Dr. Erlene Quan to follow up.  Patient can be reached by calling [phone].  Please advise if I can add her to your schedule after 11/19/18.

## 2018-11-12 NOTE — Telephone Encounter (Signed)
Happy to see her as long as she is not under the impression that I specialize in female urology  based on my gender.  I am a general urologist fellowship trained in robotic surgery.  [name], MD

## 2018-11-13 NOTE — Telephone Encounter (Signed)
Advised patient of Dr. Cherrie Gauze message.  She expressed understanding and scheduled an appointment with Dr. Erlene Quan after her CT scan.  I explained to her that Dr. Erlene Quan may suggest that she follow up with Dr. Matilde Sprang or Zara Council after her visit.  She understands.

## 2018-11-14 ENCOUNTER — Telehealth: Payer: Self-pay | Admitting: Urology

## 2018-11-14 NOTE — Telephone Encounter (Signed)
Yes that's totally fine.  Change visit type to virtual and let her know.  [name]

## 2018-11-14 NOTE — Telephone Encounter (Signed)
Pt has appt scheduled w/Brandon on 5/6.  She called office this morning to let us know she has issues with getting transportation here and wants to know if she can change appt to virtual visit to discuss CT results.  Please advise.

## 2018-11-19 ENCOUNTER — Ambulatory Visit
Admission: RE | Admit: 2018-11-19 | Discharge: 2018-11-19 | Disposition: A | Discharge status: Home / Self Care | Payer: Medicare HMO | Source: Ambulatory Visit | Attending: Urology | Admitting: Urology

## 2018-11-19 ENCOUNTER — Other Ambulatory Visit: Payer: Self-pay

## 2018-11-19 DIAGNOSIS — R102 Pelvic and perineal pain unspecified side: Secondary | ICD-10-CM

## 2018-11-19 DIAGNOSIS — N200 Calculus of kidney: Secondary | ICD-10-CM

## 2018-11-20 ENCOUNTER — Other Ambulatory Visit: Payer: Self-pay | Admitting: Pediatrics

## 2018-11-20 ENCOUNTER — Telehealth (INDEPENDENT_AMBULATORY_CARE_PROVIDER_SITE_OTHER): Payer: Medicare HMO | Admitting: Urology

## 2018-11-20 ENCOUNTER — Telehealth: Payer: Self-pay

## 2018-11-20 DIAGNOSIS — K5904 Chronic idiopathic constipation: Secondary | ICD-10-CM

## 2018-11-20 DIAGNOSIS — R102 Pelvic and perineal pain: Secondary | ICD-10-CM

## 2018-11-20 DIAGNOSIS — N200 Calculus of kidney: Secondary | ICD-10-CM | POA: Diagnosis not present

## 2018-11-20 DIAGNOSIS — R350 Frequency of micturition: Secondary | ICD-10-CM

## 2018-11-20 DIAGNOSIS — R140 Abdominal distension (gaseous): Secondary | ICD-10-CM

## 2018-11-20 MED ORDER — MIRABEGRON ER 25 MG PO TB24: 25.0000 mg | ORAL_TABLET | Freq: Every day | ORAL | 11 refills | Status: DC

## 2018-11-20 NOTE — Telephone Encounter (Signed)
Patient notified , she has a vitrual visit with Dr. Erlene Quan this afternoon to discuss bladder symptoms

## 2018-11-20 NOTE — Progress Notes (Signed)
Virtual Visit via Video Note  I connected with Brooke Jenkins on 11/20/18 at  3:00 PM EDT by a video enabled telemedicine application and verified that I am speaking with the correct person using two identifiers.  Location: Patient: home Provider: office   I discussed the limitations of evaluation and management by telemedicine and the availability of in person appointments. The patient expressed understanding and agreed to proceed.  History of Present Illness: 63 year old female requesting follow-up with a female provider for follow-up renal ultrasound as well as pelvic pain.  She was initially seen and evaluated by Dr. Bernardo Heater unfortunately 07/2018 with pelvic pressure and pain.  Notably, she does has a personal history of kidney stones and the pain sometimes radiates to the right side.  She had ultrasound on 3/20 which showed bilateral nephrolithiasis with also some mild left hydronephrosis.  In the interim, was followed with a CT stone protocol on 11/19/2018 which showed no obstructing ureteral calculi, no ureteral stones, and small nonobstructing stones. C T scan was personally reviewed.  Notably, she does have a personal history of kidney stones including history of PCNL in 2007 and ureteroscopy in 2018.  Notibly today, she continues to complain of low abdominal pain which she describes in her bladder.  No aggravating or alleviating factors.  This is unchanged from her previous visit with Dr. Bernardo Heater.  She was given samples of Myrbetriq 50 mg which she felt did not help whatsoever.  She was treated for presumed urinary tract infection which also was unremarkable and made no change in her urinary symptoms.  She continues also complain of urinary frequency associated with her pain.  No dysuria or gross hematuria.  She also has a personal history of constipation.  She reports that she can go days without having a bowel movement.  She is not on a bowel regimen currently.  In addition to the  above today, she is tearful throughout the visit and appears to be extremely anxious.   Observations/Objective:   Assessment and Plan:  1. Kidney stones Small bilateral nonobstructing stones, would recommend observation Unlikely related to patient's current presentation Encouraged adequate hydration  2. Pelvic pressure in female Recommended returning to the office for in person visit for pelvic exam  3. Urinary frequency Myrbetriq 50 mg and effective Behavioral modification discussed  4. Chronic idiopathic constipation I recommended that she work on her constipation as this may be contributing factor We discussed a bowel cleanout followed by maintenance stool softener   Follow Up Instructions: Schedule in person visit   I discussed the assessment and treatment plan with the patient. The patient was provided an opportunity to ask questions and all were answered. The patient agreed with the plan and demonstrated an understanding of the instructions.   The patient was advised to call back or seek an in-person evaluation if the symptoms worsen or if the condition fails to improve as anticipated.  I provided 15 minutes of non-face-to-face time during this encounter.   [name], MD

## 2018-11-20 NOTE — Telephone Encounter (Signed)
-----   Message from Abbie Sons, MD sent at 11/20/2018  1:05 PM EDT ----- CT shows bilateral, nonobstructing calculi.  No ureteral calculi are seen.  The renal calculi would not be responsible for her pelvic symptoms.  If she still having symptoms?

## 2018-11-26 ENCOUNTER — Ambulatory Visit: Payer: Self-pay | Admitting: Urology

## 2018-11-27 ENCOUNTER — Encounter: Payer: Self-pay | Admitting: Urology

## 2018-11-27 ENCOUNTER — Ambulatory Visit (INDEPENDENT_AMBULATORY_CARE_PROVIDER_SITE_OTHER): Payer: Medicare HMO | Admitting: Urology

## 2018-11-27 ENCOUNTER — Other Ambulatory Visit: Payer: Self-pay

## 2018-11-27 VITALS — BP 121/76 | HR 109 | Ht 60.0 in | Wt 115.0 lb

## 2018-11-27 DIAGNOSIS — R350 Frequency of micturition: Secondary | ICD-10-CM | POA: Diagnosis not present

## 2018-11-27 DIAGNOSIS — K5904 Chronic idiopathic constipation: Secondary | ICD-10-CM

## 2018-11-27 LAB — URINALYSIS, COMPLETE
Bilirubin, UA: NEGATIVE
Glucose, UA: NEGATIVE
Nitrite, UA: NEGATIVE
Protein,UA: NEGATIVE
Specific Gravity, UA: 1.015 (ref 1.005–1.030)
Urobilinogen, Ur: 1 mg/dL (ref 0.2–1.0)
pH, UA: 5 (ref 5.0–7.5)

## 2018-11-27 LAB — MICROSCOPIC EXAMINATION: Epithelial Cells (non renal): >10 /hpf — AB (ref 0–10)

## 2018-11-27 LAB — BLADDER SCAN AMB NON-IMAGING

## 2018-11-27 MED ORDER — MIRABEGRON ER 50 MG PO TB24: 50.0000 mg | ORAL_TABLET | Freq: Every day | ORAL | 11 refills | Status: DC

## 2018-11-27 NOTE — Progress Notes (Signed)
11/27/2018 11:00 AM   Brooke Jenkins 01/07/1956 [id number]  Referring provider: Barbaraann Boys, MD [address] [place], [place] [address]  Chief Complaint  Patient presents with  . Urinary Incontinence    HPI: 63 year old female with lower abdominal pressure and urinary frequency who presents today for PVR and pelvic exam.  Please see previous notes for details.  She has had improvement with Myrbetriq 25 mg but continues to have urgency and frequency.  She reports that she has several good days in a row followed by several bad days in a row.  Bad days include low abdominal pressure, urgency and frequency both daytime and nighttime.  She cannot identify any exacerbating or alleviating factors for her bad days.  She reports today that she has been struggling with constipation which is a lifelong issue.  She reports that on occasion, she has to manually disimpact herself.  Since last visit, she did try milk of magnesia which did not particularly help with her constipation.  She does not ever recall being seen or evaluated by gastroenterology.  She continues to struggle with extreme anxiety.  She is anxious today and tearful at times.  She reports today that she was hoping that something would be wrong with her.   PMH: Past Medical History:  Diagnosis Date  . Anxiety   . Arthritis   . Cataracts, bilateral   . Depression   . History of hepatitis   . Insomnia   . Tinnitus     Surgical History: Past Surgical History:  Procedure Laterality Date  . CHOLECYSTECTOMY    . HEMORROIDECTOMY    . KIDNEY STONE SURGERY    . TEMPOROMANDIBULAR JOINT SURGERY      Home Medications:  Allergies as of 11/27/2018      Reactions   Amoxicillin-pot Clavulanate Nausea And Vomiting   Aspirin    Other reaction(s): Unknown   Ciprofloxacin Nausea And Vomiting   Tramadol    Other reaction(s): Vomiting   Vicodin [hydrocodone-acetaminophen] Itching      Medication List       Accurate as  of Nov 27, 2018 11:59 PM. If you have any questions, ask your nurse or doctor.        ALPRAZolam 1 MG tablet Commonly known as:  XANAX Take by mouth.   cetirizine 10 MG tablet Commonly known as:  ZYRTEC Take by mouth.   cloNIDine 0.2 MG tablet Commonly known as:  CATAPRES Take by mouth.   CRANBERRY PO Take by mouth.   doxepin 25 MG capsule Commonly known as:  SINEQUAN Take by mouth.   gabapentin 300 MG capsule Commonly known as:  NEURONTIN 1 po qHS   hydrOXYzine 25 MG tablet Commonly known as:  ATARAX/VISTARIL   ibuprofen 600 MG tablet Commonly known as:  ADVIL Take 1 tablet (600 mg total) by mouth every 8 (eight) hours as needed.   lamoTRIgine 100 MG tablet Commonly known as:  LAMICTAL   meloxicam 15 MG tablet Commonly known as:  MOBIC Take by mouth.   methocarbamol 500 MG tablet Commonly known as:  ROBAXIN   methylphenidate 20 MG tablet Commonly known as:  RITALIN Take by mouth.   methylPREDNISolone 4 MG Tbpk tablet Commonly known as:  MEDROL DOSEPAK FPD   mirabegron ER 50 MG Tb24 tablet Commonly known as:  MYRBETRIQ Take 1 tablet (50 mg total) by mouth daily. What changed:    medication strength  how much to take Changed by:  [name], MD  OLANZapine 5 MG tablet Commonly known as:  ZYPREXA   omeprazole 20 MG capsule Commonly known as:  PRILOSEC Take 20 mg by mouth daily.   oxyCODONE-acetaminophen 5-325 MG tablet Commonly known as:  PERCOCET/ROXICET Take 1 tablet by mouth every 6 (six) hours as needed for severe pain.   Premarin vaginal cream Generic drug:  conjugated estrogens PLACE 0.5 GRAM VAGINALLY QD   sulfamethoxazole-trimethoprim 800-160 MG tablet Commonly known as:  BACTRIM DS Take 1 tablet by mouth every 12 (twelve) hours.   SUMAtriptan 50 MG tablet Commonly known as:  IMITREX Take by mouth.   tamsulosin 0.4 MG Caps capsule Commonly known as:  FLOMAX Take 1 capsule (0.4 mg total) by mouth daily.   zaleplon 10  MG capsule Commonly known as:  SONATA       Allergies:  Allergies  Allergen Reactions  . Amoxicillin-Pot Clavulanate Nausea And Vomiting  . Aspirin     Other reaction(s): Unknown  . Ciprofloxacin Nausea And Vomiting  . Tramadol     Other reaction(s): Vomiting  . Vicodin [Hydrocodone-Acetaminophen] Itching    Family History: No family history on file.  Social History:  reports that she has been smoking. She has smoked for the past 40.00 years. She has never used smokeless tobacco. She reports that she does not drink alcohol. No history on file for drug.  ROS: UROLOGY Frequent Urination?: Yes Hard to postpone urination?: No Burning/pain with urination?: No Get up at night to urinate?: No Leakage of urine?: No Urine stream starts and stops?: No Trouble starting stream?: No Do you have to strain to urinate?: Yes Blood in urine?: No Urinary tract infection?: No Sexually transmitted disease?: No Injury to kidneys or bladder?: No Painful intercourse?: No Weak stream?: No Currently pregnant?: No Vaginal bleeding?: No Last menstrual period?: Postmenopasual  Gastrointestinal Nausea?: No Vomiting?: No Indigestion/heartburn?: No Diarrhea?: No Constipation?: No  Constitutional Fever: No Night sweats?: No Weight loss?: No Fatigue?: No  Skin Skin rash/lesions?: No Itching?: No  Eyes Blurred vision?: No Double vision?: No  Ears/Nose/Throat Sore throat?: No Sinus problems?: No  Hematologic/Lymphatic Swollen glands?: No Easy bruising?: No  Cardiovascular Leg swelling?: No Chest pain?: No  Respiratory Cough?: No Shortness of breath?: No  Endocrine Excessive thirst?: No  Musculoskeletal Back pain?: No Joint pain?: No  Neurological Headaches?: No Dizziness?: No  Psychologic Depression?: No Anxiety?: No  Physical Exam: BP 121/76   Pulse (!) 109   Ht 5' (1.524 m)   Wt 115 lb (52.2 kg)   BMI 22.46 kg/m   Constitutional:  Alert and  oriented, No acute distress. HEENT: [place] AT, moist mucus membranes.  Trachea midline, no masses. Cardiovascular: No clubbing, cyanosis, or edema. Respiratory: Normal respiratory effort, no increased work of breathing. GI: Abdomen is soft, nontender, nondistended, no abdominal masses GU/pelvic: Chaperoned by CMA Rebecca.  Normal external genitalia.  Normal urethral meatus.  Mildly atrophic vaginal mucosa.  Very small stage I cystocele with Valsalva without any apical descent.  Excellent posterior support.  No focal tenderness on bimanual exam.. Skin: No rashes, bruises or suspicious lesions. Neurologic: Grossly intact, no focal deficits, moving all 4 extremities. Psychiatric: Anxious.  Tearful at times.   Pertinent Imaging: Results for orders placed or performed in visit on 11/27/18  Microscopic Examination  Result Value Ref Range   WBC, UA 11-30 (A) 0 - 5 /hpf   RBC 3-10 (A) 0 - 2 /hpf   Epithelial Cells (non renal) >10 (A) 0 - 10 /hpf   Mucus,  UA Present (A) Not Estab.   Bacteria, UA Moderate (A) None seen/Few  Urinalysis, Complete  Result Value Ref Range   Specific Gravity, UA 1.015 1.005 - 1.030   pH, UA 5.0 5.0 - 7.5   Color, UA Yellow Yellow   Appearance Ur Cloudy (A) Clear   Leukocytes,UA 1+ (A) Negative   Protein,UA Negative Negative/Trace   Glucose, UA Negative Negative   Ketones, UA Trace (A) Negative   RBC, UA 2+ (A) Negative   Bilirubin, UA Negative Negative   Urobilinogen, Ur 1.0 0.2 - 1.0 mg/dL   Nitrite, UA Negative Negative   Microscopic Examination See below:   Bladder Scan (Post Void Residual) in office  Result Value Ref Range   Scan Result 69ml     Assessment & Plan:    1. Urinary frequency Symptoms improved on Myrbetriq 25 mg, would recommend increasing to 50 mg to optimize effect She is agreeable this plan Adequate emptying today despite reporting that she sometimes has to strain to void No evidence of significant pelvic organ prolapse Urine today most  consistent with contamination, previous culture with mixed flora supporting this Plan to repeat UA at next visit to ensure resolution of microscopic blood which may be related to contamination versus stones  Lengthy discussion today about behavioral modification including avoidance of irritating beverages/foods in addition to Myrbetriq - Bladder Scan (Post Void Residual) in office - Urinalysis, Complete  2. 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

## 2018-11-29 ENCOUNTER — Ambulatory Visit (HOSPITAL_COMMUNITY): Admission: RE | Admit: 2018-11-29 | Payer: Medicare HMO | Source: Ambulatory Visit

## 2018-12-12 ENCOUNTER — Ambulatory Visit (INDEPENDENT_AMBULATORY_CARE_PROVIDER_SITE_OTHER): Payer: Medicare HMO | Admitting: Gastroenterology

## 2018-12-12 ENCOUNTER — Other Ambulatory Visit: Payer: Self-pay

## 2018-12-12 ENCOUNTER — Encounter: Payer: Self-pay | Admitting: Gastroenterology

## 2018-12-12 VITALS — BP 110/68 | HR 87 | Resp 17 | Ht 65.0 in | Wt 111.6 lb

## 2018-12-12 DIAGNOSIS — G8929 Other chronic pain: Secondary | ICD-10-CM

## 2018-12-12 DIAGNOSIS — K5909 Other constipation: Secondary | ICD-10-CM | POA: Diagnosis not present

## 2018-12-12 DIAGNOSIS — R1032 Left lower quadrant pain: Secondary | ICD-10-CM | POA: Diagnosis not present

## 2018-12-12 MED ORDER — LINACLOTIDE 72 MCG PO CAPS: 72.0000 ug | ORAL_CAPSULE | Freq: Every day | ORAL | 0 refills | Status: DC

## 2018-12-12 MED ORDER — METRONIDAZOLE 500 MG PO TABS: 500.0000 mg | ORAL_TABLET | Freq: Two times a day (BID) | ORAL | 0 refills | Status: AC

## 2018-12-12 NOTE — Patient Instructions (Signed)

## 2018-12-12 NOTE — Progress Notes (Signed)
Cephas Darby, MD [address]  [place]  [place], [place] [address]  Main: [phone]  Fax: [phone]    Gastroenterology Consultation  Referring Provider:     [name], MD Primary Care Physician:  Barbaraann Boys, MD Primary Gastroenterologist:  Dr. Cephas Darby Reason for Consultation:     Chronic constipation, left lower quadrant pain        HPI:   Brooke Jenkins is a 63 y.o. female referred by Dr. Barbaraann Boys, MD  for consultation & management of chronic constipation, left lower quadrant pain.  She was referred by urology to manage constipation as patient has urinary symptoms for the last 1 month, frequent urination.  She underwent complete evaluation by urology who felt her urinary symptoms are most likely secondary to uncontrolled constipation.  Patient reports that she has been experiencing this problem almost all her life.  She has BM every 3-4 days with significant straining, stools are hard sometimes need manual disimpaction.  She also reports left lower quadrant, sharp, crampy pain, bloating that started about a month ago.  She denies fever, chills, nausea or vomiting, rectal bleeding, blood mixed with stool, diarrhea.  She tried milk of magnesia She has not seen GI for this. She tried to drink lots of water She tried miralax as needed She is afraid to eat fruits and vegetables as her left lower quadrant pain is worse She does eat a bowl of cereal daily She also has history of hemorrhoidectomy but notices protrusion of soft tissue after a BM She has history of chronic hep C, status post treatment, confirmed eradication  NSAIDs: None  Antiplts/Anticoagulants/Anti thrombotics: None  GI Procedures: Had an EGD and colonoscopy in 02/2016 at [facility] Actual report not available A. Stomach, endoscopic biopsies:  Gastric oxyntic mucosa with no significant pathologic diagnosis.  B. Right colon polyps, endoscopic polypectomy: Tubular adenoma.  Hyperplastic polyp.  Past Medical History:  Diagnosis Date  . Anxiety   . Arthritis   . Cataracts, bilateral   . Depression   . History of hepatitis   . Insomnia   . Tinnitus     Past Surgical History:  Procedure Laterality Date  . CHOLECYSTECTOMY    . HEMORROIDECTOMY    . KIDNEY STONE SURGERY    . TEMPOROMANDIBULAR JOINT SURGERY      Current Outpatient Medications:  .  ALPRAZolam (XANAX) 1 MG tablet, Take by mouth., Disp: , Rfl:  .  cetirizine (ZYRTEC) 10 MG tablet, Take by mouth., Disp: , Rfl:  .  cloNIDine (CATAPRES) 0.2 MG tablet, Take by mouth., Disp: , Rfl:  .  hydrOXYzine (ATARAX/VISTARIL) 25 MG tablet, , Disp: , Rfl:  .  meloxicam (MOBIC) 15 MG tablet, Take by mouth., Disp: , Rfl:  .  methylphenidate (RITALIN) 20 MG tablet, Take by mouth., Disp: , Rfl:  .  mirabegron ER (MYRBETRIQ) 50 MG TB24 tablet, Take 1 tablet (50 mg total) by mouth daily., Disp: 30 tablet, Rfl: 11 .  PREMARIN vaginal cream, PLACE 0.5 GRAM VAGINALLY QD, Disp: , Rfl:  .  SUMAtriptan (IMITREX) 50 MG tablet, Take by mouth., Disp: , Rfl:  .  tiZANidine (ZANAFLEX) 4 MG tablet, TK 1/2 TO 1 T PO BID PRN, Disp: , Rfl:  .  zaleplon (SONATA) 10 MG capsule, , Disp: , Rfl:  .  linaclotide (LINZESS) 72 MCG capsule, Take 1 capsule (72 mcg total) by mouth daily before breakfast for 14 days., Disp: 14 capsule, Rfl: 0 .  metroNIDAZOLE (FLAGYL)  500 MG tablet, Take 1 tablet (500 mg total) by mouth 2 (two) times daily for 10 days., Disp: 20 tablet, Rfl: 0  No family history on file.   Social History   Tobacco Use  . Smoking status: Current Every Day Smoker    Years: 40.00  . Smokeless tobacco: Never Used  Substance Use Topics  . Alcohol use: No  . Drug use: Not on file    Allergies as of 12/12/2018 - Review Complete 12/12/2018  Allergen Reaction Noted  . Amoxicillin-pot clavulanate Nausea And Vomiting 08/23/2016  . Aspirin  01/06/2013  . Ciprofloxacin Nausea And Vomiting 05/17/2017  . Tramadol   10/07/2018  . Vicodin [hydrocodone-acetaminophen] Itching 12/17/2014    Review of Systems:    All systems reviewed and negative except where noted in HPI.   Physical Exam:  BP 110/68 (BP Location: Left Arm, Patient Position: Sitting, Cuff Size: Normal)   Pulse 87   Resp 17   Ht 5\' 5"  (1.651 m)   Wt 111 lb 9.6 oz (50.6 kg)   BMI 18.57 kg/m  No LMP recorded. Patient is postmenopausal.  General:   Alert,  Well-developed, well-nourished, pleasant and cooperative in NAD Head:  Normocephalic and atraumatic. Eyes:  Sclera clear, no icterus.   Conjunctiva pink. Ears:  Normal auditory acuity. Nose:  No deformity, discharge, or lesions. Mouth:  No deformity or lesions,oropharynx pink & moist. Neck:  Supple; no masses or thyromegaly. Lungs:  Respirations even and unlabored.  Clear throughout to auscultation.   No wheezes, crackles, or rhonchi. No acute distress. Heart:  Regular rate and rhythm; no murmurs, clicks, rubs, or gallops. Abdomen:  Normal bowel sounds. Soft, mild left lower quadrant tenderness and non-distended without masses, hepatosplenomegaly or hernias noted.  No guarding or rebound tenderness.   Rectal: Mild soft tissue protrusion, hemorrhoid prolapse versus rectal prolapse Msk:  Symmetrical without gross deformities. Good, equal movement & strength bilaterally. Pulses:  Normal pulses noted. Extremities:  No clubbing or edema.  No cyanosis. Neurologic:  Alert and oriented x3;  grossly normal neurologically. Skin:  Intact without significant lesions or rashes. No jaundice. Lymph Nodes:  No significant cervical adenopathy. Psych:  Alert and cooperative. Normal mood and affect.  Imaging Studies: Reviewed  Assessment and Plan:   Brooke Jenkins is a 63 y.o. female with chronic constipation, 1 month history of frequent urination, referred by urology.  With her history, I am worried that she may have developed rectal prolapse which may be contributing to increased urinary  frequency.  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

## 2018-12-30 ENCOUNTER — Telehealth: Payer: Self-pay | Admitting: Gastroenterology

## 2018-12-30 ENCOUNTER — Other Ambulatory Visit: Payer: Self-pay

## 2018-12-30 MED ORDER — LINACLOTIDE 72 MCG PO CAPS: 72.0000 ug | ORAL_CAPSULE | Freq: Every day | ORAL | 0 refills | Status: DC

## 2018-12-30 NOTE — Telephone Encounter (Signed)
Medication has been refilled and sent to pharmacy, pt has been notified  

## 2018-12-30 NOTE — Telephone Encounter (Signed)
Patient called & would like a refill on linaclotide (LINZESS) 72 MCG called into [facility] in [place]

## 2019-01-22 ENCOUNTER — Ambulatory Visit: Payer: Medicare HMO | Admitting: Gastroenterology

## 2019-01-22 ENCOUNTER — Encounter: Payer: Self-pay | Admitting: *Deleted

## 2019-02-06 ENCOUNTER — Other Ambulatory Visit: Payer: Self-pay | Admitting: Gastroenterology

## 2019-03-07 ENCOUNTER — Encounter: Payer: Self-pay | Admitting: Gastroenterology

## 2019-03-07 ENCOUNTER — Other Ambulatory Visit: Payer: Self-pay

## 2019-03-07 ENCOUNTER — Ambulatory Visit: Payer: Medicare HMO | Admitting: Gastroenterology

## 2019-03-10 ENCOUNTER — Telehealth: Payer: Self-pay | Admitting: Gastroenterology

## 2019-03-10 NOTE — Telephone Encounter (Signed)
Left vm for pt  She missed her apt last week I believe she went to the [facility] instead of [facility] .

## 2019-03-19 ENCOUNTER — Other Ambulatory Visit: Payer: Self-pay | Admitting: Sports Medicine

## 2019-03-19 DIAGNOSIS — M94262 Chondromalacia, left knee: Secondary | ICD-10-CM

## 2019-03-19 DIAGNOSIS — G8929 Other chronic pain: Secondary | ICD-10-CM

## 2019-03-19 DIAGNOSIS — M25562 Pain in left knee: Secondary | ICD-10-CM

## 2019-03-26 ENCOUNTER — Other Ambulatory Visit: Payer: Self-pay | Admitting: Gastroenterology

## 2019-03-31 ENCOUNTER — Other Ambulatory Visit: Payer: Self-pay

## 2019-03-31 ENCOUNTER — Telehealth: Payer: Self-pay | Admitting: Gastroenterology

## 2019-03-31 MED ORDER — LINACLOTIDE 72 MCG PO CAPS: ORAL_CAPSULE | ORAL | 1 refills | Status: DC

## 2019-03-31 NOTE — Telephone Encounter (Signed)
Pt is calling she needs refill on rx Linzess  She is not sure the mgs to [facility] in [place]

## 2019-03-31 NOTE — Telephone Encounter (Signed)
Medication has been refilled and sent to pharmacy  

## 2019-04-13 ENCOUNTER — Ambulatory Visit: Admission: RE | Admit: 2019-04-13 | Payer: Medicare HMO | Source: Ambulatory Visit

## 2019-05-05 ENCOUNTER — Encounter: Payer: Self-pay | Admitting: Gastroenterology

## 2019-05-05 ENCOUNTER — Ambulatory Visit (INDEPENDENT_AMBULATORY_CARE_PROVIDER_SITE_OTHER): Payer: Medicare HMO | Admitting: Gastroenterology

## 2019-05-05 ENCOUNTER — Other Ambulatory Visit: Payer: Self-pay

## 2019-05-05 VITALS — BP 147/83 | HR 78 | Temp 98.1°F | Resp 17 | Ht 60.0 in | Wt 100.4 lb

## 2019-05-05 DIAGNOSIS — R634 Abnormal weight loss: Secondary | ICD-10-CM

## 2019-05-05 DIAGNOSIS — R110 Nausea: Secondary | ICD-10-CM

## 2019-05-05 DIAGNOSIS — Z86010 Personal history of colonic polyps: Secondary | ICD-10-CM

## 2019-05-05 DIAGNOSIS — K5909 Other constipation: Secondary | ICD-10-CM

## 2019-05-05 MED ORDER — ONDANSETRON HCL 4 MG PO TABS: 4.0000 mg | ORAL_TABLET | Freq: Three times a day (TID) | ORAL | 0 refills | Status: AC | PRN

## 2019-05-05 NOTE — Progress Notes (Signed)
Cephas Darby, MD [address]  [place]  [place], [place] [address]  Main: [phone]  Fax: [phone]    Gastroenterology Consultation  Referring Provider:     Barbaraann Boys, MD Primary Care Physician:  Barbaraann Boys, MD Primary Gastroenterologist:  Dr. Cephas Darby Reason for Consultation:     Chronic constipation, nausea, weight loss        HPI:   Brooke Jenkins is a 63 y.o. female referred by Dr. Barbaraann Boys, MD  for consultation & management of chronic constipation, left lower quadrant pain.  She was referred by urology to manage constipation as patient has urinary symptoms for the last 1 month, frequent urination.  She underwent complete evaluation by urology who felt her urinary symptoms are most likely secondary to uncontrolled constipation.  Patient reports that she has been experiencing this problem almost all her life.  She has BM every 3-4 days with significant straining, stools are hard sometimes need manual disimpaction.  She also reports left lower quadrant, sharp, crampy pain, bloating that started about a month ago.  She denies fever, chills, nausea or vomiting, rectal bleeding, blood mixed with stool, diarrhea.  She tried milk of magnesia She has not seen GI for this. She tried to drink lots of water She tried miralax as needed She is afraid to eat fruits and vegetables as her left lower quadrant pain is worse She does eat a bowl of cereal daily She also has history of hemorrhoidectomy but notices protrusion of soft tissue after a BM She has history of chronic hep C, status post treatment, confirmed eradication  Follow-up visit 05/05/2019 Patient reports that current dose of Linzess 72 MCG has been intermittently helping with constipation.  She has severe insomnia, anxiety and depression for which she had a change in her medications.  Currently on Klonopin, Cymbalta as well as a new medication called hetlioz which is a melatonin receptor agonist  for insomnia.  She also reports feeling tired during the day, dizzy when she wakes up.  She was told that her iron levels are low and has to take oral iron.  She is currently taking 65 mg of iron daily.  Patient reports experiencing nausea limiting p.o. intake, lack of appetite for the last 3 weeks.  She completely eliminated sweetened tea which she used to drink regularly and since then, she lost about 15 pounds and she currently weighs 100 pounds.  Patient claims that this is her actual her weight prior to consuming sweetened tea.  She is recommended to take omeprazole over-the-counter for nausea and she thinks it is providing some relief.  NSAIDs: None  Antiplts/Anticoagulants/Anti thrombotics: None  GI Procedures: Had an EGD and colonoscopy in 02/2016 at [facility] Actual report not available A. Stomach, endoscopic biopsies:  Gastric oxyntic mucosa with no significant pathologic diagnosis.  B. Right colon polyps, endoscopic polypectomy: Tubular adenoma. Hyperplastic polyp.  Past Medical History:  Diagnosis Date  . Anxiety   . Arthritis   . Cataracts, bilateral   . Depression   . History of hepatitis   . Insomnia   . Tinnitus     Past Surgical History:  Procedure Laterality Date  . CHOLECYSTECTOMY    . HEMORROIDECTOMY    . KIDNEY STONE SURGERY    . TEMPOROMANDIBULAR JOINT SURGERY      Current Outpatient Medications:  .  acyclovir (ZOVIRAX) 400 MG tablet, TAKE 1 TABLET BY MOUTH 3 TIMES PER DAY FOR 5 DAYS AT FIRST SIGN OF  OUTBREAK. DO NOT TAKE WITH ZANAFLEX, Disp: , Rfl:  .  cetirizine (ZYRTEC) 10 MG tablet, Take by mouth., Disp: , Rfl:  .  clonazePAM (KLONOPIN) 1 MG tablet, , Disp: , Rfl:  .  cloNIDine (CATAPRES) 0.2 MG tablet, Take by mouth., Disp: , Rfl:  .  DULoxetine (CYMBALTA) 60 MG capsule, , Disp: , Rfl:  .  HETLIOZ 20 MG CAPS, , Disp: , Rfl:  .  linaclotide (LINZESS) 72 MCG capsule, TAKE ONE CAPSULE BY MOUTH EVERY DAY BEFORE BREAKFAST FOR 14 DAYS, Disp: 30 capsule, Rfl: 1  .  methylphenidate (RITALIN) 20 MG tablet, Take by mouth., Disp: , Rfl:  .  SUMAtriptan (IMITREX) 50 MG tablet, Take by mouth., Disp: , Rfl:  .  tiZANidine (ZANAFLEX) 4 MG tablet, TK 1/2 TO 1 T PO BID PRN, Disp: , Rfl:  .  ondansetron (ZOFRAN) 4 MG tablet, Take 1 tablet (4 mg total) by mouth every 8 (eight) hours as needed for up to 14 days for nausea or vomiting., Disp: 28 tablet, Rfl: 0  No family history on file.   Social History   Tobacco Use  . Smoking status: Current Every Day Smoker    Years: 40.00  . Smokeless tobacco: Never Used  Substance Use Topics  . Alcohol use: No  . Drug use: Not on file    Allergies as of 05/05/2019 - Review Complete 05/05/2019  Allergen Reaction Noted  . Amoxicillin-pot clavulanate Nausea And Vomiting 08/23/2016  . Aspirin  01/06/2013  . Ciprofloxacin Nausea And Vomiting 05/17/2017  . Mirabegron Other (See Comments) 01/20/2019  . Tramadol  10/07/2018  . Vicodin [hydrocodone-acetaminophen] Itching 12/17/2014    Review of Systems:    All systems reviewed and negative except where noted in HPI.   Physical Exam:  BP (!) 147/83 (BP Location: Left Arm, Patient Position: Sitting, Cuff Size: Normal)   Pulse 78   Temp 98.1 F (36.7 C)   Resp 17   Ht 5' (1.524 m)   Wt 100 lb 6.4 oz (45.5 kg)   BMI 19.61 kg/m  No LMP recorded. Patient is postmenopausal.  General:   Alert,  Well-developed, well-nourished, pleasant and cooperative in NAD Head:  Normocephalic and atraumatic. Eyes:  Sclera clear, no icterus.   Conjunctiva pink. Ears:  Normal auditory acuity. Nose:  No deformity, discharge, or lesions. Mouth:  No deformity or lesions,oropharynx pink & moist. Neck:  Supple; no masses or thyromegaly. Lungs:  Respirations even and unlabored.  Clear throughout to auscultation.   No wheezes, crackles, or rhonchi. No acute distress. Heart:  Regular rate and rhythm; no murmurs, clicks, rubs, or gallops. Abdomen:  Normal bowel sounds. Soft, mildly  distended, tympanic to percussion without masses, hepatosplenomegaly or hernias noted.  No guarding or rebound tenderness.   Rectal: Not examined Msk:  Symmetrical without gross deformities. Good, equal movement & strength bilaterally. Pulses:  Normal pulses noted. Extremities:  No clubbing or edema.  No cyanosis. Neurologic:  Alert and oriented x3;  grossly normal neurologically. Skin:  Intact without significant lesions or rashes. No jaundice. Psych:  Alert and cooperative. Normal mood and affect.  Imaging Studies: Reviewed  Assessment and Plan:   ARELIS NIGHSWANDER is a 63 y.o. female with chronic constipation, previous history of nausea, lack of appetite, weight loss  Chronic constipation Increase Linzess to 145 MCG daily Advised her about high-fiber diet, fiber supplements, Information provided  History of colon adenomas Recommend surveillance colonoscopy  Nausea, anorexia and weight loss Recently changed her psychotropic  medications,  Side effect from new medications is a possibility Recommend upper endoscopy Continue Prilosec daily Zofran as needed  Follow up in 1-2 months   Cephas Darby, MD

## 2019-05-11 ENCOUNTER — Other Ambulatory Visit: Payer: Self-pay

## 2019-05-11 ENCOUNTER — Ambulatory Visit
Admission: RE | Admit: 2019-05-11 | Discharge: 2019-05-11 | Disposition: A | Discharge status: Home / Self Care | Payer: Medicare HMO | Source: Ambulatory Visit | Attending: Sports Medicine | Admitting: Sports Medicine

## 2019-05-11 DIAGNOSIS — G8929 Other chronic pain: Secondary | ICD-10-CM

## 2019-05-11 DIAGNOSIS — M94262 Chondromalacia, left knee: Secondary | ICD-10-CM | POA: Diagnosis present

## 2019-05-11 DIAGNOSIS — M25562 Pain in left knee: Secondary | ICD-10-CM | POA: Insufficient documentation

## 2019-05-13 ENCOUNTER — Other Ambulatory Visit: Payer: Self-pay

## 2019-05-13 ENCOUNTER — Emergency Department: Payer: Medicare HMO

## 2019-05-13 ENCOUNTER — Emergency Department
Admission: EM | Admit: 2019-05-13 | Discharge: 2019-05-13 | Disposition: A | Discharge status: Home / Self Care | Payer: Medicare HMO | Attending: Emergency Medicine | Admitting: Emergency Medicine

## 2019-05-13 ENCOUNTER — Encounter: Payer: Self-pay | Admitting: *Deleted

## 2019-05-13 DIAGNOSIS — W01198A Fall on same level from slipping, tripping and stumbling with subsequent striking against other object, initial encounter: Secondary | ICD-10-CM | POA: Diagnosis not present

## 2019-05-13 DIAGNOSIS — Y93E9 Activity, other interior property and clothing maintenance: Secondary | ICD-10-CM | POA: Diagnosis not present

## 2019-05-13 DIAGNOSIS — Z79899 Other long term (current) drug therapy: Secondary | ICD-10-CM | POA: Diagnosis not present

## 2019-05-13 DIAGNOSIS — Y92009 Unspecified place in unspecified non-institutional (private) residence as the place of occurrence of the external cause: Secondary | ICD-10-CM | POA: Diagnosis not present

## 2019-05-13 DIAGNOSIS — Y998 Other external cause status: Secondary | ICD-10-CM | POA: Insufficient documentation

## 2019-05-13 DIAGNOSIS — F17200 Nicotine dependence, unspecified, uncomplicated: Secondary | ICD-10-CM | POA: Insufficient documentation

## 2019-05-13 DIAGNOSIS — G44319 Acute post-traumatic headache, not intractable: Secondary | ICD-10-CM

## 2019-05-13 DIAGNOSIS — R519 Headache, unspecified: Secondary | ICD-10-CM | POA: Diagnosis present

## 2019-05-13 MED ORDER — BUTALBITAL-APAP-CAFFEINE 50-325-40 MG PO TABS: 1.0000 | ORAL_TABLET | Freq: Four times a day (QID) | ORAL | 0 refills | Status: DC | PRN

## 2019-05-13 MED ORDER — ONDANSETRON 4 MG PO TBDP: 4.0000 mg | ORAL_TABLET | Freq: Three times a day (TID) | ORAL | 0 refills | Status: DC | PRN

## 2019-05-13 MED ORDER — METHOCARBAMOL 500 MG PO TABS: 500.0000 mg | ORAL_TABLET | Freq: Four times a day (QID) | ORAL | 0 refills | Status: DC

## 2019-05-13 NOTE — ED Triage Notes (Addendum)
Pt ambulatory to triage.  Pt fell 5 days ago on the hardwood floor.  Pt has a headache with nausea, and back pain and neck pain.  Pt alert  Speech clear.  No blood thinners.

## 2019-05-13 NOTE — ED Notes (Signed)
See triage noted  Presents s/p fall about 5 days ago  Having h/a and neck pain

## 2019-05-13 NOTE — ED Provider Notes (Signed)
[facility] Emergency Department Provider Note  ____________________________________________  Time seen: Approximately 7:12 PM  I have reviewed the triage vital signs and the nursing notes.   HISTORY  Chief Complaint Fall, Headache, Back Pain, and Nausea    HPI Brooke Jenkins is a 63 y.o. female who presents the emergency department complaining of ongoing headache, nausea after head injury.  Patient reports that 5 days ago she was cleaning out a closet, stepped backwards and tripped over some hangers.  Patient fell, striking her head on the floor.  Patient states that there is a thin layer of carpet over the hardwood floors.  Patient did not lose consciousness but does have a headache.  She states that she has a long history of tension type headaches and has been experiencing a daily headache since head trauma.  She discussed the symptoms with her primary care and was referred to the emergency department for CT scan.  Patient does have some nausea, photophobia and phonophobia.  No emesis.  No subsequent loss of consciousness.  Patient denies any radicular symptoms.  Patient has medical history as described below with no complaints regarding chronic medical problems other than headache.         Past Medical History:  Diagnosis Date  . Anxiety   . Arthritis   . Cataracts, bilateral   . Chronic hepatitis C ([place]) 04/25/2010  . Depression   . History of hepatitis   . Insomnia   . Tinnitus     Patient Active Problem List   Diagnosis Date Noted  . Tobacco use disorder 11/05/2018  . Post-menopausal atrophic vaginitis 10/18/2018  . Seasonal allergic rhinitis due to pollen 11/13/2017  . 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 Nephrolithiasis 03/28/2016  . DDD (degenerative disc disease), cervical 01/12/2016  . Tinnitus, bilateral 01/12/2016  . ADD (attention deficit disorder) 12/10/2015  . Chronic idiopathic constipation 12/07/2015  . Depression, major,  recurrent, in partial remission ([place]) 12/07/2015  . GERD (gastroesophageal reflux disease) 12/07/2015  . Nicotine dependence, uncomplicated [id number]  . Anxiety disorder 12/26/2013  . Lower urinary tract infectious disease 05/08/2013  . Menopausal hot flushes 05/08/2013  . Postmenopausal HRT (hormone replacement therapy) 05/08/2013  . 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 Acute upper respiratory infection 03/03/2012  . Acute midline low back pain without sciatica 04/11/2011  . Neck pain 04/11/2011  . Complete rupture of rotator cuff 10/06/2010  . Depressive disorder 04/25/2010    Past Surgical History:  Procedure Laterality Date  . CHOLECYSTECTOMY    . HEMORROIDECTOMY    . KIDNEY STONE SURGERY    . TEMPOROMANDIBULAR JOINT SURGERY      Prior to Admission medications   Medication Sig Start Date End Date Taking? Authorizing Provider  acyclovir (ZOVIRAX) 400 MG tablet TAKE 1 TABLET BY MOUTH 3 TIMES PER DAY FOR 5 DAYS AT FIRST SIGN OF OUTBREAK. DO NOT TAKE WITH ZANAFLEX 05/01/19   [provider]  butalbital-acetaminophen-caffeine (FIORICET) 50-325-40 MG tablet Take 1 tablet by mouth every 6 (six) hours as needed for headache. 05/13/19 05/12/20  Cuthriell, Charline Bills, PA-C  cetirizine (ZYRTEC) 10 MG tablet Take by mouth. 10/18/18 10/18/19  [provider]  clonazePAM Bobbye Charleston) 1 MG tablet  04/28/19   [provider]  cloNIDine (CATAPRES) 0.2 MG tablet Take by mouth. 01/03/18   [provider]  DULoxetine (CYMBALTA) 60 MG capsule  03/03/19   [provider]  HETLIOZ 20 [place]  04/24/19   [provider]  linaclotide Rolan Lipa) 72  MCG capsule TAKE ONE CAPSULE BY MOUTH EVERY DAY BEFORE BREAKFAST FOR 14 DAYS 03/31/19   Vanga, Tally Due, MD  methocarbamol (ROBAXIN) 500 MG tablet Take 1 tablet (500 mg total) by mouth 4 (four) times daily. 05/13/19   Cuthriell, Charline Bills, PA-C  methylphenidate (RITALIN) 20 MG tablet Take by mouth.    [provider]  ondansetron (ZOFRAN) 4 MG tablet Take 1 tablet (4 mg total) by mouth every 8 (eight) hours as needed for up to 14 days for nausea or vomiting. 05/05/19 05/19/19  Lin Landsman, MD  ondansetron (ZOFRAN-ODT) 4 MG disintegrating tablet Take 1 tablet (4 mg total) by mouth every 8 (eight) hours as needed for nausea or vomiting. 05/13/19   Cuthriell, Charline Bills, PA-C  SUMAtriptan (IMITREX) 50 MG tablet Take by mouth. 07/08/18   [provider]  tiZANidine (ZANAFLEX) 4 MG tablet TK 1/2 TO 1 T PO BID PRN 11/27/18   [provider]    Allergies Amoxicillin-pot clavulanate, Aspirin, Ciprofloxacin, Mirabegron, Tramadol, and Vicodin [hydrocodone-acetaminophen]  No family history on file.  Social History Social History   Tobacco Use  . Smoking status: Current Every Day Smoker    Years: 40.00  . Smokeless tobacco: Never Used  Substance Use Topics  . Alcohol use: No  . Drug use: Not on file     Review of Systems  Constitutional: No fever/chills Eyes: No visual changes. No discharge ENT: No upper respiratory complaints. Cardiovascular: no chest pain. Respiratory: no cough. No SOB. Gastrointestinal: No abdominal pain.  Positive nausea, no vomiting.  No diarrhea.  No constipation. Musculoskeletal: Negative for musculoskeletal pain. Skin: Negative for rash, abrasions, lacerations, ecchymosis. Neurological: Positive for headache but denies focal weakness or numbness. 10-point ROS otherwise negative.  ____________________________________________   PHYSICAL EXAM:  VITAL SIGNS: ED Triage Vitals  Enc Vitals Group     BP 05/13/19 1747 100/67     Pulse Rate 05/13/19 1747 91     Resp 05/13/19 1747 18     Temp 05/13/19 1747 98.2 F (36.8 C)     Temp Source 05/13/19 1747 Oral     SpO2 05/13/19 1747 98 %     Weight 05/13/19 1748 100 lb (45.4 kg)     Height 05/13/19 1748 5' (1.524 m)     Head Circumference --      Peak Flow --      Pain Score 05/13/19  1748 10     Pain Loc --      Pain Edu? --      Excl. in [place]? --      Constitutional: Alert and oriented. Well appearing and in no acute distress. Eyes: Conjunctivae are normal. PERRL. EOMI. Head: Atraumatic.  No visible traumatic findings.  No tenderness to palpation of the osseous structures of the skull and face.  No battle signs or raccoon eyes.  No serosanguineous fluid drainage from the ears or nares. ENT:      Ears:       Nose: No congestion/rhinnorhea.      Mouth/Throat: Mucous membranes are moist.  Neck: No stridor.  No cervical spine tenderness to palpation.  Cardiovascular: Normal rate, regular rhythm. Normal S1 and S2.  Good peripheral circulation. Respiratory: Normal respiratory effort without tachypnea or retractions. Lungs CTAB. Good air entry to the bases with no decreased or absent breath sounds. Musculoskeletal: Full range of motion to all extremities. No gross deformities appreciated. Neurologic:  Normal speech and language. No gross focal neurologic deficits are appreciated.  Cranial nerves II through XII grossly intact.  Negative Romberg's and pronator drift. Skin:  Skin is warm, dry and intact. No rash noted. Psychiatric: Mood and affect are normal. Speech and behavior are normal. Patient exhibits appropriate insight and judgement.   ____________________________________________   LABS (all labs ordered are listed, but only abnormal results are displayed)  Labs Reviewed - No data to display ____________________________________________  EKG   ____________________________________________  RADIOLOGY I personally viewed and evaluated these images as part of my medical decision making, as well as reviewing the written report by the radiologist.  Ct Head Wo Contrast  Result Date: 05/13/2019 CLINICAL DATA:  Headache.  Status post fall 5 days ago. EXAM: CT HEAD WITHOUT CONTRAST CT CERVICAL SPINE WITHOUT CONTRAST TECHNIQUE: Multidetector CT imaging of the head and  cervical spine was performed following the standard protocol without intravenous contrast. Multiplanar CT image reconstructions of the cervical spine were also generated. COMPARISON:  Head and cervical CT May 17, 2017 FINDINGS: CT HEAD FINDINGS Brain: No evidence of acute infarction, hemorrhage, hydrocephalus, extra-axial collection or mass lesion/mass effect. Vascular: No hyperdense vessel is noted. Skull: Normal. Negative for fracture or focal lesion. Sinuses/Orbits: Mild mucoperiosteal thickening of right ethmoid sinus is noted. Other: None. CT CERVICAL SPINE FINDINGS Alignment: There is straightening of cervical spine either due to muscle spasm or positioning. Skull base and vertebrae: No acute fracture. No primary bone lesion or focal pathologic process. Soft tissues and spinal canal: No prevertebral fluid or swelling. No visible canal hematoma. Disc levels: Degenerative joint changes with osteophyte formation and narrowed joint space are identified in the mid to lower cervical spine. Upper chest: Biapical scar is noted. Other: None. IMPRESSION: No focal acute intracranial abnormality identified. No acute fracture or dislocation of cervical spine. Electronically Signed   By: Abelardo Diesel M.D.   On: 05/13/2019 18:38   Ct Cervical Spine Wo Contrast  Result Date: 05/13/2019 CLINICAL DATA:  Headache.  Status post fall 5 days ago. EXAM: CT HEAD WITHOUT CONTRAST CT CERVICAL SPINE WITHOUT CONTRAST TECHNIQUE: Multidetector CT imaging of the head and cervical spine was performed following the standard protocol without intravenous contrast. Multiplanar CT image reconstructions of the cervical spine were also generated. COMPARISON:  Head and cervical CT May 17, 2017 FINDINGS: CT HEAD FINDINGS Brain: No evidence of acute infarction, hemorrhage, hydrocephalus, extra-axial collection or mass lesion/mass effect. Vascular: No hyperdense vessel is noted. Skull: Normal. Negative for fracture or focal lesion.  Sinuses/Orbits: Mild mucoperiosteal thickening of right ethmoid sinus is noted. Other: None. CT CERVICAL SPINE FINDINGS Alignment: There is straightening of cervical spine either due to muscle spasm or positioning. Skull base and vertebrae: No acute fracture. No primary bone lesion or focal pathologic process. Soft tissues and spinal canal: No prevertebral fluid or swelling. No visible canal hematoma. Disc levels: Degenerative joint changes with osteophyte formation and narrowed joint space are identified in the mid to lower cervical spine. Upper chest: Biapical scar is noted. Other: None. IMPRESSION: No focal acute intracranial abnormality identified. No acute fracture or dislocation of cervical spine. Electronically Signed   By: Abelardo Diesel M.D.   On: 05/13/2019 18:38    ____________________________________________    PROCEDURES  Procedure(s) performed:    Procedures    Medications - No data to display   ____________________________________________   INITIAL IMPRESSION / ASSESSMENT AND PLAN / ED COURSE  Pertinent labs & imaging results that were available during my care of the patient were reviewed by me and considered in my medical  decision making (see chart for details).  Review of the [place] CSRS was performed in accordance of the [place] prior to dispensing any controlled drugs.           Patient's diagnosis is consistent with posttraumatic headache.  Patient presented to the emergency department with ongoing headache, nausea, photophobia and phonophobia.  Patient has a history of tension type headaches.  She has been taking Excedrin like medication at home with no relief.  Patient was advised to present to the emergency department for CT scan due to ongoing symptoms.  Patient is neurologically intact at this time.  Negative CT scan of the head and neck.  Differential includes posttraumatic headache, worsening of tension type headache, or concussion.  At this time I have discussed  concussion and postconcussive symptoms with the patient that I do suspect this is just an worsening of her acute headache syndrome.  Patient will be placed on Fioricet, Robaxin, Zofran for symptoms.  Follow-up primary care as needed..  Patient is given ED precautions to return to the ED for any worsening or new symptoms.     ____________________________________________  FINAL CLINICAL IMPRESSION(S) / ED DIAGNOSES  Final diagnoses:  Acute post-traumatic headache, not intractable      NEW MEDICATIONS STARTED DURING THIS VISIT:  ED Discharge Orders         Ordered    methocarbamol (ROBAXIN) 500 MG tablet  4 times daily     05/13/19 1917    ondansetron (ZOFRAN-ODT) 4 MG disintegrating tablet  Every 8 hours PRN     05/13/19 1917    butalbital-acetaminophen-caffeine (FIORICET) 50-325-40 MG tablet  Every 6 hours PRN     05/13/19 1917              This chart was dictated using voice recognition software/Dragon. Despite best efforts to proofread, errors can occur which can change the meaning. Any change was purely unintentional.    Darletta Moll, PA-C 05/13/19 1918    Nena Polio, MD 05/13/19 2053

## 2019-05-14 ENCOUNTER — Telehealth: Payer: Self-pay | Admitting: Gastroenterology

## 2019-05-14 NOTE — Telephone Encounter (Signed)
Pt left vm she has an endoscopy and colonoscopy on Monday she has several questions please call pt

## 2019-05-15 ENCOUNTER — Other Ambulatory Visit: Payer: Self-pay

## 2019-05-15 ENCOUNTER — Other Ambulatory Visit
Admission: RE | Admit: 2019-05-15 | Discharge: 2019-05-15 | Disposition: A | Discharge status: Home / Self Care | Payer: Medicare HMO | Source: Ambulatory Visit | Attending: Gastroenterology | Admitting: Gastroenterology

## 2019-05-15 DIAGNOSIS — Z20828 Contact with and (suspected) exposure to other viral communicable diseases: Secondary | ICD-10-CM | POA: Diagnosis not present

## 2019-05-15 DIAGNOSIS — Z01812 Encounter for preprocedural laboratory examination: Secondary | ICD-10-CM | POA: Insufficient documentation

## 2019-05-15 NOTE — Telephone Encounter (Signed)
Returned patients call.  LVM for her to call back to address her questions regarding procedure.  Thanks [employer]

## 2019-05-16 LAB — SARS CORONAVIRUS 2 (TAT 6-24 HRS): SARS Coronavirus 2: NEGATIVE

## 2019-05-19 ENCOUNTER — Ambulatory Visit
Admission: RE | Admit: 2019-05-19 | Discharge: 2019-05-19 | Disposition: A | Discharge status: Home / Self Care | Payer: Medicare HMO | Attending: Gastroenterology | Admitting: Gastroenterology

## 2019-05-19 ENCOUNTER — Ambulatory Visit: Payer: Medicare HMO | Admitting: Certified Registered Nurse Anesthetist

## 2019-05-19 ENCOUNTER — Encounter
Admission: RE | Disposition: A | Discharge status: Home / Self Care | Payer: Self-pay | Source: Home / Self Care | Attending: Gastroenterology

## 2019-05-19 ENCOUNTER — Encounter: Payer: Self-pay | Admitting: *Deleted

## 2019-05-19 DIAGNOSIS — K6389 Other specified diseases of intestine: Secondary | ICD-10-CM | POA: Insufficient documentation

## 2019-05-19 DIAGNOSIS — K3189 Other diseases of stomach and duodenum: Secondary | ICD-10-CM | POA: Diagnosis not present

## 2019-05-19 DIAGNOSIS — G4700 Insomnia, unspecified: Secondary | ICD-10-CM | POA: Diagnosis not present

## 2019-05-19 DIAGNOSIS — F329 Major depressive disorder, single episode, unspecified: Secondary | ICD-10-CM | POA: Insufficient documentation

## 2019-05-19 DIAGNOSIS — D124 Benign neoplasm of descending colon: Secondary | ICD-10-CM | POA: Insufficient documentation

## 2019-05-19 DIAGNOSIS — R110 Nausea: Secondary | ICD-10-CM | POA: Diagnosis not present

## 2019-05-19 DIAGNOSIS — R634 Abnormal weight loss: Secondary | ICD-10-CM | POA: Insufficient documentation

## 2019-05-19 DIAGNOSIS — K219 Gastro-esophageal reflux disease without esophagitis: Secondary | ICD-10-CM | POA: Insufficient documentation

## 2019-05-19 DIAGNOSIS — L538 Other specified erythematous conditions: Secondary | ICD-10-CM | POA: Insufficient documentation

## 2019-05-19 DIAGNOSIS — Z7982 Long term (current) use of aspirin: Secondary | ICD-10-CM | POA: Insufficient documentation

## 2019-05-19 DIAGNOSIS — Z79899 Other long term (current) drug therapy: Secondary | ICD-10-CM | POA: Diagnosis not present

## 2019-05-19 DIAGNOSIS — K635 Polyp of colon: Secondary | ICD-10-CM

## 2019-05-19 DIAGNOSIS — D175 Benign lipomatous neoplasm of intra-abdominal organs: Secondary | ICD-10-CM | POA: Insufficient documentation

## 2019-05-19 DIAGNOSIS — K644 Residual hemorrhoidal skin tags: Secondary | ICD-10-CM | POA: Insufficient documentation

## 2019-05-19 DIAGNOSIS — Z681 Body mass index (BMI) 19 or less, adult: Secondary | ICD-10-CM | POA: Insufficient documentation

## 2019-05-19 DIAGNOSIS — K2970 Gastritis, unspecified, without bleeding: Secondary | ICD-10-CM | POA: Diagnosis not present

## 2019-05-19 DIAGNOSIS — K449 Diaphragmatic hernia without obstruction or gangrene: Secondary | ICD-10-CM | POA: Diagnosis not present

## 2019-05-19 DIAGNOSIS — F17200 Nicotine dependence, unspecified, uncomplicated: Secondary | ICD-10-CM | POA: Insufficient documentation

## 2019-05-19 DIAGNOSIS — R630 Anorexia: Secondary | ICD-10-CM | POA: Insufficient documentation

## 2019-05-19 DIAGNOSIS — B182 Chronic viral hepatitis C: Secondary | ICD-10-CM | POA: Insufficient documentation

## 2019-05-19 DIAGNOSIS — F419 Anxiety disorder, unspecified: Secondary | ICD-10-CM | POA: Insufficient documentation

## 2019-05-19 DIAGNOSIS — Z881 Allergy status to other antibiotic agents status: Secondary | ICD-10-CM | POA: Insufficient documentation

## 2019-05-19 DIAGNOSIS — Z86010 Personal history of colonic polyps: Secondary | ICD-10-CM | POA: Insufficient documentation

## 2019-05-19 DIAGNOSIS — M19049 Primary osteoarthritis, unspecified hand: Secondary | ICD-10-CM | POA: Diagnosis not present

## 2019-05-19 DIAGNOSIS — H269 Unspecified cataract: Secondary | ICD-10-CM | POA: Insufficient documentation

## 2019-05-19 DIAGNOSIS — K5730 Diverticulosis of large intestine without perforation or abscess without bleeding: Secondary | ICD-10-CM | POA: Insufficient documentation

## 2019-05-19 DIAGNOSIS — Z886 Allergy status to analgesic agent status: Secondary | ICD-10-CM | POA: Insufficient documentation

## 2019-05-19 DIAGNOSIS — Z885 Allergy status to narcotic agent status: Secondary | ICD-10-CM | POA: Insufficient documentation

## 2019-05-19 HISTORY — PX: ESOPHAGOGASTRODUODENOSCOPY (EGD) WITH PROPOFOL: SHX5813

## 2019-05-19 HISTORY — PX: COLONOSCOPY WITH PROPOFOL: SHX5780

## 2019-05-19 SURGERY — COLONOSCOPY WITH PROPOFOL
Anesthesia: General

## 2019-05-19 MED ORDER — PROPOFOL 10 MG/ML IV BOLUS
INTRAVENOUS | Status: DC | PRN
  Administered 2019-05-19 (×4): 30 mg via INTRAVENOUS

## 2019-05-19 MED ORDER — SODIUM CHLORIDE 0.9 % IV SOLN
INTRAVENOUS | Status: DC
  Administered 2019-05-19: 10:00:00 via INTRAVENOUS

## 2019-05-19 MED ORDER — PROPOFOL 500 MG/50ML IV EMUL
INTRAVENOUS | Status: DC | PRN
  Administered 2019-05-19: 100 ug/kg/min via INTRAVENOUS

## 2019-05-19 NOTE — Anesthesia Preprocedure Evaluation (Signed)
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, ~2 FB MP IV due to above TM 3 FB Limited neck extension Dental  (+) Upper Dentures, Lower Dentures States she cannot get dentures out, she does not take them out at night:   Pulmonary neg COPD, Current Smoker,           Cardiovascular (-) Past MI and (-) Cardiac Stents negative cardio ROS  (-) dysrhythmias      Neuro/Psych PSYCHIATRIC DISORDERS Anxiety Depression negative neurological ROS     GI/Hepatic GERD  Controlled,(+) Hepatitis -, C  Endo/Other  negative endocrine ROS  Renal/GU negative Renal ROS  negative genitourinary   Musculoskeletal   Abdominal   Peds  Hematology negative hematology ROS (+)   Anesthesia Other Findings Past Medical History: No date: Anxiety No date: Arthritis No date: Cataracts, bilateral 04/25/2010: Chronic hepatitis C ([facility]) No date: Depression No date: History of hepatitis No date: Insomnia No date: Tinnitus  Past Surgical History: No date: CHOLECYSTECTOMY No date: HEMORROIDECTOMY No date: KIDNEY STONE SURGERY No date: TEMPOROMANDIBULAR JOINT SURGERY  BMI    Body Mass Index: 19.53 kg/m      Reproductive/Obstetrics negative OB ROS                             Anesthesia Physical Anesthesia Plan  ASA: II  Anesthesia Plan: General   Post-op Pain Management:    Induction:   PONV Risk Score and Plan: Propofol infusion and TIVA  Airway Management Planned: Natural Airway and Nasal Cannula  Additional Equipment:   Intra-op Plan:   Post-operative Plan:   Informed Consent: I have reviewed the patients History  and Physical, chart, labs and discussed the procedure including the risks, benefits and alternatives for the proposed anesthesia with the patient or authorized representative who has indicated his/her understanding and acceptance.     Dental Advisory Given  Plan Discussed with: Anesthesiologist, CRNA and Surgeon  Anesthesia Plan Comments:         Anesthesia Quick Evaluation

## 2019-05-19 NOTE — Transfer of Care (Signed)
Immediate Anesthesia Transfer of Care Note  Patient: Brooke Jenkins  Procedure(s) Performed: COLONOSCOPY WITH PROPOFOL (N/A ) ESOPHAGOGASTRODUODENOSCOPY (EGD) WITH PROPOFOL (N/A )  Patient Location: PACU  Anesthesia Type:General  Level of Consciousness: awake, alert  and patient cooperative  Airway & Oxygen Therapy: Patient Spontanous Breathing  Post-op Assessment: Report given to RN and Post -op Vital signs reviewed and stable  Post vital signs: Reviewed and stable  Last Vitals:  Vitals Value Taken Time  BP 101/66 05/19/19 1059  Temp    Pulse 93 05/19/19 1100  Resp 18 05/19/19 1100  SpO2 100 % 05/19/19 1100  Vitals shown include unvalidated device data.  Last Pain:  Vitals:   05/19/19 0931  TempSrc: Tympanic  PainSc: 0-No pain         Complications: No apparent anesthesia complications

## 2019-05-19 NOTE — Anesthesia Postprocedure Evaluation (Signed)
Anesthesia Post Note  Patient: Brooke Jenkins  Procedure(s) Performed: COLONOSCOPY WITH PROPOFOL (N/A ) ESOPHAGOGASTRODUODENOSCOPY (EGD) WITH PROPOFOL (N/A )  Patient location during evaluation: PACU Anesthesia Type: General Level of consciousness: awake and alert Pain management: pain level controlled Vital Signs Assessment: post-procedure vital signs reviewed and stable Respiratory status: spontaneous breathing, nonlabored ventilation and respiratory function stable Cardiovascular status: blood pressure returned to baseline and stable Postop Assessment: no apparent nausea or vomiting Anesthetic complications: no     Last Vitals:  Vitals:   05/19/19 1110 05/19/19 1120  BP: (!) 157/97 (!) 157/87  Pulse: 72 79  Resp: (!) 28 14  Temp:    SpO2: 100% 99%    Last Pain:  Vitals:   05/19/19 1120  TempSrc:   PainSc: 0-No pain                 Durenda Hurt

## 2019-05-19 NOTE — Op Note (Signed)
[facility]astroenterology Patient Name: Brooke Jenkins Procedure Date: 05/19/2019 10:09 AM MRN: [id number] Account #: [id number] Date of Birth: [birth year] Admit Type: Outpatient Age: 63 Room: [facility] ENDO ROOM 2 Gender: Female Note Status: Finalized Procedure:            Colonoscopy Indications:          High risk colon cancer surveillance: Personal history                        of colonic polyps, Last colonoscopy: September 2008 Providers:            Lin Landsman MD, MD Referring MD:         Ane Payment, MD (Referring MD) Medicines:            Monitored Anesthesia Care Complications:        No immediate complications. Estimated blood loss: None. Procedure:            Pre-Anesthesia Assessment:                       - Prior to the procedure, a History and Physical was                        performed, and patient medications and allergies were                        reviewed. The patient is competent. The risks and                        benefits of the procedure and the sedation options and                        risks were discussed with the patient. All questions                        were answered and informed consent was obtained.                        Patient identification and proposed procedure were                        verified by the physician, the nurse, the                        anesthesiologist, the anesthetist and the technician in                        the pre-procedure area in the procedure room in the                        endoscopy suite. Mental Status Examination: normal.                        Airway Examination: normal oropharyngeal airway and                        neck mobility. Respiratory Examination: clear to                        auscultation. CV Examination: normal. Prophylactic  Antibiotics: The patient does not require prophylactic                        antibiotics. Prior Anticoagulants: The  patient has                        taken no previous anticoagulant or antiplatelet agents.                        ASA Grade Assessment: III - A patient with severe                        systemic disease. After reviewing the risks and                        benefits, the patient was deemed in satisfactory                        condition to undergo the procedure. The anesthesia plan                        was to use monitored anesthesia care (MAC). Immediately                        prior to administration of medications, the patient was                        re-assessed for adequacy to receive sedatives. The                        heart rate, respiratory rate, oxygen saturations, blood                        pressure, adequacy of pulmonary ventilation, and                        response to care were monitored throughout the                        procedure. The physical status of the patient was                        re-assessed after the procedure.                       After obtaining informed consent, the colonoscope was                        passed under direct vision. Throughout the procedure,                        the patient's blood pressure, pulse, and oxygen                        saturations were monitored continuously. The                        Colonoscope was introduced through the anus and                        advanced  to the the cecum, identified by appendiceal                        orifice and ileocecal valve. The colonoscopy was                        performed with moderate difficulty due to a tortuous                        colon. Successful completion of the procedure was aided                        by applying abdominal pressure. The patient tolerated                        the procedure well. The quality of the bowel                        preparation was evaluated using the BBPS [facility] Bowel                        Preparation Scale) with scores of: Right  Colon = 3,                        Transverse Colon = 3 and Left Colon = 3 (entire mucosa                        seen well with no residual staining, small fragments of                        stool or opaque liquid). The total BBPS score equals 9. Findings:      Hemorrhoids were found on perianal exam.      A 6 mm polyp was found in the descending colon. The polyp was sessile.       The polyp was removed with a cold snare. Resection and retrieval were       complete.      A few diverticula were found in the sigmoid colon.      There was a large lipoma, in the recto-sigmoid colon.      Non-bleeding external hemorrhoids were found during retroflexion. The       hemorrhoids were large. Impression:           - Hemorrhoids found on perianal exam.                       - One 6 mm polyp in the descending colon, removed with                        a cold snare. Resected and retrieved.                       - Diverticulosis in the sigmoid colon.                       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                       - Non-bleeding external hemorrhoids. Recommendation:       - Discharge  patient to home (with escort).                       - High fiber diet.                       - Continue present medications.                       - Await pathology results.                       - Repeat colonoscopy in 5 years for surveillance.                       - Return to my office as previously scheduled. Procedure Code(s):    --- Professional ---                       [phone], Colonoscopy, flexible; with removal of tumor(s),                        polyp(s), or other lesion(s) by snare technique Diagnosis Code(s):    --- Professional ---                       Z86.010, Personal history of colonic polyps                       K64.4, Residual hemorrhoidal skin tags                       K63.5, Polyp of colon                       D17.5, Benign lipomatous neoplasm of intra-abdominal                         organs                       K57.30, Diverticulosis of large intestine without                        perforation or abscess without bleeding CPT copyright 2019 American Medical Association. All rights reserved. The codes documented in this report are preliminary and upon coder review may  be revised to meet current compliance requirements. Dr. Ulyess Mort Lin Landsman MD, MD 05/19/2019 10:56:04 AM This report has been signed electronically. Number of Addenda: 0 Note Initiated On: 05/19/2019 10:09 AM Scope Withdrawal Time: 0 hours 19 minutes 13 seconds  Total Procedure Duration: 0 hours 27 minutes 20 seconds  Estimated Blood Loss: Estimated blood loss: none.      [facility]

## 2019-05-19 NOTE — Anesthesia Post-op Follow-up Note (Signed)
Anesthesia QCDR form completed.        

## 2019-05-19 NOTE — H&P (Signed)
Cephas Darby, MD [address]  [place]  [place], [place] [address]  Main: [phone]  Fax: [phone] Pager: [phone]  Primary Care Physician:  Barbaraann Boys, MD Primary Gastroenterologist:  Dr. Cephas Darby  Pre-Procedure History & Physical: HPI:  Brooke Jenkins is a 63 y.o. female is here for an endoscopy and colonoscopy.   Past Medical History:  Diagnosis Date  . Anxiety   . Arthritis   . Cataracts, bilateral   . Chronic hepatitis C ([place]) 04/25/2010  . Depression   . History of hepatitis   . Insomnia   . Tinnitus     Past Surgical History:  Procedure Laterality Date  . CHOLECYSTECTOMY    . HEMORROIDECTOMY    . KIDNEY STONE SURGERY    . TEMPOROMANDIBULAR JOINT SURGERY      Prior to Admission medications   Medication Sig Start Date End Date Taking? Authorizing Provider  acyclovir (ZOVIRAX) 400 MG tablet TAKE 1 TABLET BY MOUTH 3 TIMES PER DAY FOR 5 DAYS AT FIRST SIGN OF OUTBREAK. DO NOT TAKE WITH ZANAFLEX 05/01/19   [provider]  butalbital-acetaminophen-caffeine (FIORICET) 50-325-40 MG tablet Take 1 tablet by mouth every 6 (six) hours as needed for headache. 05/13/19 05/12/20  Cuthriell, Charline Bills, PA-C  cetirizine (ZYRTEC) 10 MG tablet Take by mouth. 10/18/18 10/18/19  [provider]  clonazePAM Bobbye Charleston) 1 MG tablet  04/28/19   [provider]  cloNIDine (CATAPRES) 0.2 MG tablet Take by mouth. 01/03/18   [provider]  DULoxetine (CYMBALTA) 60 MG capsule  03/03/19   [provider]  HETLIOZ 20 [place]  04/24/19   [provider]  linaclotide (LINZESS) 72 MCG capsule TAKE ONE CAPSULE BY MOUTH EVERY DAY BEFORE BREAKFAST FOR 14 DAYS 03/31/19   Aundra Pung, Tally Due, MD  methocarbamol (ROBAXIN) 500 MG tablet Take 1 tablet (500 mg total) by mouth 4 (four) times daily. 05/13/19   Cuthriell, Charline Bills, PA-C  methylphenidate (RITALIN) 20 MG tablet Take by mouth.    [provider]   ondansetron (ZOFRAN) 4 MG tablet Take 1 tablet (4 mg total) by mouth every 8 (eight) hours as needed for up to 14 days for nausea or vomiting. 05/05/19 05/19/19  Lin Landsman, MD  ondansetron (ZOFRAN-ODT) 4 MG disintegrating tablet Take 1 tablet (4 mg total) by mouth every 8 (eight) hours as needed for nausea or vomiting. 05/13/19   Cuthriell, Charline Bills, PA-C  SUMAtriptan (IMITREX) 50 MG tablet Take by mouth. 07/08/18   [provider]  tiZANidine (ZANAFLEX) 4 MG tablet TK 1/2 TO 1 T PO BID PRN 11/27/18   [provider]    Allergies as of 05/05/2019 - Review Complete 05/05/2019  Allergen Reaction Noted  . Amoxicillin-pot clavulanate Nausea And Vomiting 08/23/2016  . Aspirin  01/06/2013  . Ciprofloxacin Nausea And Vomiting 05/17/2017  . Mirabegron Other (See Comments) 01/20/2019  . Tramadol  10/07/2018  . Vicodin [hydrocodone-acetaminophen] Itching 12/17/2014    History reviewed. No pertinent family history.  Social History   Socioeconomic History  . Marital status: Single    Spouse name: Not on file  . Number of children: Not on file  . Years of education: Not on file  . Highest education level: Not on file  Occupational History  . Not on file  Social Needs  . Financial resource strain: Not on file  . Food insecurity    Worry: Not on file    Inability: Not on file  .  Transportation needs    Medical: Not on file    Non-medical: Not on file  Tobacco Use  . Smoking status: Current Every Day Smoker    Years: 40.00  . Smokeless tobacco: Never Used  Substance and Sexual Activity  . Alcohol use: No  . Drug use: Not Currently  . Sexual activity: Not Currently    Birth control/protection: Post-menopausal  Lifestyle  . Physical activity    Days per week: Not on file    Minutes per session: Not on file  . Stress: Not on file  Relationships  . Social Herbalist on phone: Not on file    Gets together: Not on file    Attends religious  service: Not on file    Active member of club or organization: Not on file    Attends meetings of clubs or organizations: Not on file    Relationship status: Not on file  . Intimate partner violence    Fear of current or ex partner: Not on file    Emotionally abused: Not on file    Physically abused: Not on file    Forced sexual activity: Not on file  Other Topics Concern  . Not on file  Social History Narrative  . Not on file    Review of Systems: See HPI, otherwise negative ROS  Physical Exam: BP (!) 145/94   Pulse 71   Temp (!) 97.4 F (36.3 C) (Tympanic)   Resp 16   Ht 5' (1.524 m)   Wt 45.4 kg   SpO2 100%   BMI 19.53 kg/m  General:   Alert,  pleasant and cooperative in NAD Head:  Normocephalic and atraumatic. Neck:  Supple; no masses or thyromegaly. Lungs:  Clear throughout to auscultation.    Heart:  Regular rate and rhythm. Abdomen:  Soft, nontender and nondistended. Normal bowel sounds, without guarding, and without rebound.   Neurologic:  Alert and  oriented x4;  grossly normal neurologically.  Impression/Plan: Brooke Jenkins is here for an endoscopy and colonoscopy to be performed for anorexia, weight loss, history of colon adenomas  Risks, benefits, limitations, and alternatives regarding  endoscopy and colonoscopy have been reviewed with the patient.  Questions have been answered.  All parties agreeable.   Sherri Sear, MD  05/19/2019, 9:59 AM

## 2019-05-19 NOTE — Op Note (Signed)
[facility]astroenterology Patient Name: Brooke Jenkins Procedure Date: 05/19/2019 10:10 AM MRN: [id number] Account #: [id number] Date of Birth: 08-18-1955 Admit Type: Outpatient Age: 63 Room: [facility] ENDO ROOM 2 Gender: Female Note Status: Finalized Procedure:            Upper GI endoscopy Indications:          Anorexia, Weight loss Providers:            Lin Landsman MD, MD Referring MD:         Ane Payment, MD (Referring MD) Medicines:            Monitored Anesthesia Care Complications:        No immediate complications. Estimated blood loss: None. Procedure:            Pre-Anesthesia Assessment:                       - Prior to the procedure, a History and Physical was                        performed, and patient medications and allergies were                        reviewed. The patient is competent. The risks and                        benefits of the procedure and the sedation options and                        risks were discussed with the patient. All questions                        were answered and informed consent was obtained.                        Patient identification and proposed procedure were                        verified by the physician, the nurse, the                        anesthesiologist, the anesthetist and the technician in                        the pre-procedure area in the procedure room in the                        endoscopy suite. Mental Status Examination: alert and                        oriented. Airway Examination: normal oropharyngeal                        airway and neck mobility. Respiratory Examination:                        clear to auscultation. CV Examination: normal.                        Prophylactic Antibiotics: The patient does not require  prophylactic antibiotics. Prior Anticoagulants: The                        patient has taken no previous anticoagulant or        antiplatelet agents. ASA Grade Assessment: III - A                        patient with severe systemic disease. After reviewing                        the risks and benefits, the patient was deemed in                        satisfactory condition to undergo the procedure. The                        anesthesia plan was to use monitored anesthesia care                        (MAC). Immediately prior to administration of                        medications, the patient was re-assessed for adequacy                        to receive sedatives. The heart rate, respiratory rate,                        oxygen saturations, blood pressure, adequacy of                        pulmonary ventilation, and response to care were                        monitored throughout the procedure. The physical status                        of the patient was re-assessed after the procedure.                       After obtaining informed consent, the endoscope was                        passed under direct vision. Throughout the procedure,                        the patient's blood pressure, pulse, and oxygen                        saturations were monitored continuously. The Endoscope                        was introduced through the mouth, and advanced to the                        second part of duodenum. The upper GI endoscopy was                        accomplished without difficulty. The patient tolerated  the procedure well. Findings:      The duodenal bulb and second portion of the duodenum were normal.       Biopsies for histology were taken with a cold forceps for evaluation of       celiac disease.      Diffuse moderately erythematous mucosa without bleeding was found in the       entire examined stomach. Biopsies were taken with a cold forceps for       Helicobacter pylori testing.      A small hiatal hernia was present.      The cardia and gastric fundus were normal on  retroflexion.      The gastroesophageal junction and examined esophagus were normal. Impression:           - Normal duodenal bulb and second portion of the                        duodenum. Biopsied.                       - Erythematous mucosa in the stomach. Biopsied.                       - Small hiatal hernia.                       - Normal gastroesophageal junction and esophagus. Recommendation:       - Await pathology results.                       - Proceed with colonoscopy as scheduled                       - See colonoscopy report                       - Return to my office as previously scheduled. Procedure Code(s):    --- Professional ---                       [phone], Esophagogastroduodenoscopy, flexible, transoral;                        with biopsy, single or multiple Diagnosis Code(s):    --- Professional ---                       K31.89, Other diseases of stomach and duodenum                       K44.9, Diaphragmatic hernia without obstruction or                        gangrene                       R63.4, Abnormal weight loss                       R63.0, Anorexia CPT copyright 2019 American Medical Association. All rights reserved. The codes documented in this report are preliminary and upon coder review may  be revised to meet current compliance requirements. Dr. Ulyess Mort Lin Landsman MD, MD 05/19/2019 10:23:27 AM This report has been signed electronically. Number of Addenda:  0 Note Initiated On: 05/19/2019 10:10 AM Estimated Blood Loss: Estimated blood loss: none.      [facility]

## 2019-05-20 ENCOUNTER — Encounter: Payer: Self-pay | Admitting: Gastroenterology

## 2019-05-20 LAB — SURGICAL PATHOLOGY

## 2019-05-21 ENCOUNTER — Encounter: Payer: Self-pay | Admitting: Gastroenterology

## 2019-05-31 ENCOUNTER — Other Ambulatory Visit: Payer: Self-pay | Admitting: Gastroenterology

## 2019-06-03 ENCOUNTER — Ambulatory Visit: Payer: Medicare HMO | Admitting: Urology

## 2019-06-06 ENCOUNTER — Other Ambulatory Visit: Payer: Self-pay | Admitting: Sports Medicine

## 2019-06-06 DIAGNOSIS — M545 Low back pain, unspecified: Secondary | ICD-10-CM

## 2019-06-06 DIAGNOSIS — W19XXXD Unspecified fall, subsequent encounter: Secondary | ICD-10-CM

## 2019-06-06 DIAGNOSIS — G8929 Other chronic pain: Secondary | ICD-10-CM

## 2019-07-22 ENCOUNTER — Encounter: Payer: Self-pay | Admitting: Urology

## 2019-07-22 ENCOUNTER — Ambulatory Visit: Payer: Medicare HMO | Admitting: Urology

## 2019-07-25 ENCOUNTER — Ambulatory Visit: Payer: Medicare HMO

## 2019-08-08 ENCOUNTER — Ambulatory Visit
Admission: RE | Admit: 2019-08-08 | Discharge: 2019-08-08 | Disposition: A | Discharge status: Home / Self Care | Payer: Medicare HMO | Source: Ambulatory Visit | Attending: Sports Medicine | Admitting: Sports Medicine

## 2019-08-08 ENCOUNTER — Other Ambulatory Visit: Payer: Self-pay

## 2019-08-08 DIAGNOSIS — M545 Low back pain, unspecified: Secondary | ICD-10-CM

## 2019-08-08 DIAGNOSIS — W19XXXD Unspecified fall, subsequent encounter: Secondary | ICD-10-CM | POA: Diagnosis not present

## 2019-08-08 DIAGNOSIS — G8929 Other chronic pain: Secondary | ICD-10-CM

## 2019-08-22 ENCOUNTER — Emergency Department: Payer: Medicare HMO

## 2019-08-22 ENCOUNTER — Other Ambulatory Visit: Payer: Self-pay

## 2019-08-22 ENCOUNTER — Encounter: Payer: Self-pay | Admitting: Intensive Care

## 2019-08-22 ENCOUNTER — Emergency Department
Admission: EM | Admit: 2019-08-22 | Discharge: 2019-08-23 | Disposition: A | Discharge status: Home / Self Care | Payer: Medicare HMO | Attending: Emergency Medicine | Admitting: Emergency Medicine

## 2019-08-22 DIAGNOSIS — F17210 Nicotine dependence, cigarettes, uncomplicated: Secondary | ICD-10-CM | POA: Diagnosis not present

## 2019-08-22 DIAGNOSIS — Y9389 Activity, other specified: Secondary | ICD-10-CM | POA: Insufficient documentation

## 2019-08-22 DIAGNOSIS — Y92410 Unspecified street and highway as the place of occurrence of the external cause: Secondary | ICD-10-CM | POA: Insufficient documentation

## 2019-08-22 DIAGNOSIS — Y998 Other external cause status: Secondary | ICD-10-CM | POA: Insufficient documentation

## 2019-08-22 DIAGNOSIS — Z79899 Other long term (current) drug therapy: Secondary | ICD-10-CM | POA: Diagnosis not present

## 2019-08-22 DIAGNOSIS — F1310 Sedative, hypnotic or anxiolytic abuse, uncomplicated: Secondary | ICD-10-CM | POA: Diagnosis not present

## 2019-08-22 DIAGNOSIS — S060X0A Concussion without loss of consciousness, initial encounter: Secondary | ICD-10-CM | POA: Diagnosis not present

## 2019-08-22 DIAGNOSIS — M25562 Pain in left knee: Secondary | ICD-10-CM | POA: Diagnosis not present

## 2019-08-22 DIAGNOSIS — S0990XA Unspecified injury of head, initial encounter: Secondary | ICD-10-CM | POA: Diagnosis present

## 2019-08-22 HISTORY — DX: Calculus of kidney: N20.0

## 2019-08-22 LAB — BASIC METABOLIC PANEL
Anion gap: 10 (ref 5–15)
BUN: 13 mg/dL (ref 8–23)
CO2: 27 mmol/L (ref 22–32)
Calcium: 9.4 mg/dL (ref 8.9–10.3)
Chloride: 102 mmol/L (ref 98–111)
Creatinine, Ser: 0.85 mg/dL (ref 0.44–1.00)
GFR calc Af Amer: >60 mL/min (ref >60)
GFR calc non Af Amer: >60 mL/min (ref >60)
Glucose, Bld: 93 mg/dL (ref 70–99)
Potassium: 4 mmol/L (ref 3.5–5.1)
Sodium: 139 mmol/L (ref 135–145)

## 2019-08-22 LAB — URINALYSIS, COMPLETE (UACMP) WITH MICROSCOPIC
Bacteria, UA: NONE SEEN
Bilirubin Urine: NEGATIVE
Glucose, UA: NEGATIVE mg/dL
Ketones, ur: NEGATIVE mg/dL
Nitrite: NEGATIVE
Protein, ur: NEGATIVE mg/dL
Specific Gravity, Urine: 1.011 (ref 1.005–1.030)
pH: 5 (ref 5.0–8.0)

## 2019-08-22 LAB — CBC
HCT: 44.8 % (ref 36.0–46.0)
Hemoglobin: 14.6 g/dL (ref 12.0–15.0)
MCH: 33.2 pg (ref 26.0–34.0)
MCHC: 32.6 g/dL (ref 30.0–36.0)
MCV: 101.8 fL — ABNORMAL HIGH (ref 80.0–100.0)
Platelets: 254 10*3/uL (ref 150–400)
RBC: 4.4 MIL/uL (ref 3.87–5.11)
RDW: 12.3 % (ref 11.5–15.5)
WBC: 7.5 10*3/uL (ref 4.0–10.5)
nRBC: 0 % (ref 0.0–0.2)

## 2019-08-22 MED ORDER — BUTALBITAL-APAP-CAFFEINE 50-325-40 MG PO TABS
1.0000 | ORAL_TABLET | Freq: Once | ORAL | Status: AC
  Administered 2019-08-23: 1 via ORAL
  Filled 2019-08-22: qty 1
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
## 2019-08-22 NOTE — ED Notes (Addendum)
Pt denies ETOH use within the last 24 hours ("or two years for that matter")  Pt to CT att

## 2019-08-22 NOTE — ED Notes (Signed)
Patient walking through lobby in no acute distress.
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
## 2019-08-23 DIAGNOSIS — S060X0A Concussion without loss of consciousness, initial encounter: Secondary | ICD-10-CM | POA: Diagnosis not present

## 2019-08-23 LAB — URINE DRUG SCREEN, QUALITATIVE (ARMC ONLY)
Amphetamines, Ur Screen: NOT DETECTED
Barbiturates, Ur Screen: POSITIVE — AB
Benzodiazepine, Ur Scrn: POSITIVE — AB
Cannabinoid 50 Ng, Ur ~~LOC~~: NOT DETECTED
Cocaine Metabolite,Ur ~~LOC~~: NOT DETECTED
MDMA (Ecstasy)Ur Screen: NOT DETECTED
Methadone Scn, Ur: NOT DETECTED
Opiate, Ur Screen: NOT DETECTED
Phencyclidine (PCP) Ur S: NOT DETECTED
Tricyclic, Ur Screen: NOT DETECTED

## 2019-08-23 MED ORDER — LIDOCAINE 5 % EX PTCH: 1.0000 | MEDICATED_PATCH | Freq: Two times a day (BID) | CUTANEOUS | 0 refills | Status: AC

## 2019-08-23 MED ORDER — LIDOCAINE 5 % EX PTCH
2.0000 | MEDICATED_PATCH | CUTANEOUS | Status: DC
  Administered 2019-08-23: 2 via TRANSDERMAL
  Filled 2019-08-23: qty 2

## 2019-08-23 NOTE — ED Notes (Signed)
No peripheral IV placed this visit.   Discharge instructions reviewed with patient. Questions fielded by this RN. Patient verbalizes understanding of instructions. Patient discharged home in stable condition per [facility]. No acute distress noted at time of discharge.   Pt ambulatory to lobby

## 2019-08-23 NOTE — ED Notes (Signed)
Pt returned from radiology att

## 2019-08-28 DIAGNOSIS — G43009 Migraine without aura, not intractable, without status migrainosus: Secondary | ICD-10-CM | POA: Insufficient documentation

## 2019-09-25 ENCOUNTER — Other Ambulatory Visit: Payer: Self-pay

## 2019-09-25 MED ORDER — LINACLOTIDE 72 MCG PO CAPS: ORAL_CAPSULE | ORAL | 1 refills | Status: DC

## 2019-09-25 NOTE — Telephone Encounter (Signed)
Last office visit 05/05/2019 Chronic constipation  Last refill 06/02/2019 1 refills  No appointment is scheduled

## 2019-10-02 ENCOUNTER — Other Ambulatory Visit: Payer: Self-pay | Admitting: Neurology

## 2019-10-02 DIAGNOSIS — R251 Tremor, unspecified: Secondary | ICD-10-CM

## 2019-10-14 ENCOUNTER — Ambulatory Visit: Admission: RE | Admit: 2019-10-14 | Payer: Medicare HMO | Source: Ambulatory Visit

## 2019-11-16 ENCOUNTER — Emergency Department: Payer: Medicare HMO

## 2019-11-16 ENCOUNTER — Emergency Department
Admission: EM | Admit: 2019-11-16 | Discharge: 2019-11-16 | Disposition: A | Discharge status: Home / Self Care | Payer: Medicare HMO | Attending: Emergency Medicine | Admitting: Emergency Medicine

## 2019-11-16 ENCOUNTER — Encounter: Payer: Self-pay | Admitting: Emergency Medicine

## 2019-11-16 ENCOUNTER — Other Ambulatory Visit: Payer: Self-pay

## 2019-11-16 DIAGNOSIS — Z79899 Other long term (current) drug therapy: Secondary | ICD-10-CM | POA: Diagnosis not present

## 2019-11-16 DIAGNOSIS — M542 Cervicalgia: Secondary | ICD-10-CM | POA: Insufficient documentation

## 2019-11-16 DIAGNOSIS — F17210 Nicotine dependence, cigarettes, uncomplicated: Secondary | ICD-10-CM | POA: Diagnosis not present

## 2019-11-16 DIAGNOSIS — Z9049 Acquired absence of other specified parts of digestive tract: Secondary | ICD-10-CM | POA: Insufficient documentation

## 2019-11-16 DIAGNOSIS — R519 Headache, unspecified: Secondary | ICD-10-CM | POA: Insufficient documentation

## 2019-11-16 DIAGNOSIS — M25562 Pain in left knee: Secondary | ICD-10-CM | POA: Insufficient documentation

## 2019-11-16 MED ORDER — BUTALBITAL-APAP-CAFFEINE 50-325-40 MG PO TABS: 1.0000 | ORAL_TABLET | Freq: Four times a day (QID) | ORAL | 0 refills | Status: AC | PRN

## 2019-11-16 MED ORDER — KETOROLAC TROMETHAMINE 30 MG/ML IJ SOLN
30.0000 mg | Freq: Once | INTRAMUSCULAR | Status: AC
  Administered 2019-11-16: 30 mg via INTRAMUSCULAR
  Filled 2019-11-16: qty 1

## 2019-11-16 MED ORDER — ONDANSETRON 4 MG PO TBDP
4.0000 mg | ORAL_TABLET | Freq: Once | ORAL | Status: AC
  Administered 2019-11-16: 4 mg via ORAL
  Filled 2019-11-16: qty 1

## 2019-11-16 MED ORDER — OXYCODONE-ACETAMINOPHEN 5-325 MG PO TABS
1.0000 | ORAL_TABLET | Freq: Once | ORAL | Status: AC
  Administered 2019-11-16: 1 via ORAL
  Filled 2019-11-16: qty 1

## 2019-11-16 NOTE — ED Notes (Signed)
Pt out of room and stating she wanted to leave- states she is tired and her head is hurting and she just wants to go home- informed pt that provider was in another room but should be in to see her next- pt back to room asking for tissues- tissues provided
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
## 2020-01-28 ENCOUNTER — Ambulatory Visit: Admission: RE | Admit: 2020-01-28 | Payer: Medicare HMO | Source: Ambulatory Visit

## 2020-02-12 ENCOUNTER — Emergency Department: Payer: Medicare HMO

## 2020-02-12 ENCOUNTER — Encounter: Payer: Self-pay | Admitting: Emergency Medicine

## 2020-02-12 ENCOUNTER — Emergency Department
Admission: EM | Admit: 2020-02-12 | Discharge: 2020-02-12 | Disposition: A | Discharge status: Home / Self Care | Payer: Medicare HMO | Attending: Emergency Medicine | Admitting: Emergency Medicine

## 2020-02-12 ENCOUNTER — Ambulatory Visit
Admission: RE | Admit: 2020-02-12 | Discharge: 2020-02-12 | Disposition: A | Discharge status: Home / Self Care | Payer: Medicare HMO | Source: Ambulatory Visit | Attending: Neurology | Admitting: Neurology

## 2020-02-12 ENCOUNTER — Other Ambulatory Visit: Payer: Self-pay

## 2020-02-12 DIAGNOSIS — M542 Cervicalgia: Secondary | ICD-10-CM

## 2020-02-12 DIAGNOSIS — M545 Low back pain, unspecified: Secondary | ICD-10-CM

## 2020-02-12 DIAGNOSIS — M47816 Spondylosis without myelopathy or radiculopathy, lumbar region: Secondary | ICD-10-CM | POA: Diagnosis not present

## 2020-02-12 DIAGNOSIS — M47819 Spondylosis without myelopathy or radiculopathy, site unspecified: Secondary | ICD-10-CM

## 2020-02-12 DIAGNOSIS — G8929 Other chronic pain: Secondary | ICD-10-CM | POA: Diagnosis not present

## 2020-02-12 DIAGNOSIS — R251 Tremor, unspecified: Secondary | ICD-10-CM | POA: Insufficient documentation

## 2020-02-12 DIAGNOSIS — N3090 Cystitis, unspecified without hematuria: Secondary | ICD-10-CM | POA: Diagnosis not present

## 2020-02-12 DIAGNOSIS — F17210 Nicotine dependence, cigarettes, uncomplicated: Secondary | ICD-10-CM | POA: Insufficient documentation

## 2020-02-12 LAB — URINALYSIS, COMPLETE (UACMP) WITH MICROSCOPIC
Bacteria, UA: NONE SEEN
Bilirubin Urine: NEGATIVE
Glucose, UA: NEGATIVE mg/dL
Ketones, ur: NEGATIVE mg/dL
Nitrite: NEGATIVE
Protein, ur: NEGATIVE mg/dL
Specific Gravity, Urine: 1.011 (ref 1.005–1.030)
pH: 7 (ref 5.0–8.0)

## 2020-02-12 MED ORDER — SULFAMETHOXAZOLE-TRIMETHOPRIM 800-160 MG PO TABS
1.0000 | ORAL_TABLET | Freq: Once | ORAL | Status: AC
  Administered 2020-02-12: 1 via ORAL
  Filled 2020-02-12: qty 1

## 2020-02-12 MED ORDER — LIDOCAINE 5 % EX PTCH
1.0000 | MEDICATED_PATCH | CUTANEOUS | Status: DC
  Administered 2020-02-12: 1 via TRANSDERMAL
  Filled 2020-02-12: qty 1

## 2020-02-12 MED ORDER — LIDOCAINE 5 % EX PTCH: 1.0000 | MEDICATED_PATCH | CUTANEOUS | 0 refills | Status: DC

## 2020-02-12 MED ORDER — KETOROLAC TROMETHAMINE 30 MG/ML IJ SOLN
30.0000 mg | Freq: Once | INTRAMUSCULAR | Status: AC
  Administered 2020-02-12: 30 mg via INTRAMUSCULAR
  Filled 2020-02-12: qty 1

## 2020-02-12 MED ORDER — KETOROLAC TROMETHAMINE 10 MG PO TABS: 10.0000 mg | ORAL_TABLET | Freq: Four times a day (QID) | ORAL | 0 refills | Status: DC | PRN

## 2020-02-12 MED ORDER — METAXALONE 800 MG PO TABS: 800.0000 mg | ORAL_TABLET | Freq: Three times a day (TID) | ORAL | 0 refills | Status: DC

## 2020-02-12 MED ORDER — OXYCODONE-ACETAMINOPHEN 5-325 MG PO TABS
1.0000 | ORAL_TABLET | Freq: Once | ORAL | Status: AC
  Administered 2020-02-12: 1 via ORAL
  Filled 2020-02-12: qty 1

## 2020-02-12 MED ORDER — SULFAMETHOXAZOLE-TRIMETHOPRIM 800-160 MG PO TABS: 1.0000 | ORAL_TABLET | Freq: Two times a day (BID) | ORAL | 0 refills | Status: DC
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
## 2020-02-26 DIAGNOSIS — R634 Abnormal weight loss: Secondary | ICD-10-CM

## 2020-10-26 ENCOUNTER — Ambulatory Visit (INDEPENDENT_AMBULATORY_CARE_PROVIDER_SITE_OTHER): Payer: Medicare HMO | Admitting: Gastroenterology

## 2020-10-26 ENCOUNTER — Encounter: Payer: Self-pay | Admitting: Gastroenterology

## 2020-10-26 ENCOUNTER — Other Ambulatory Visit: Payer: Self-pay

## 2020-10-26 VITALS — BP 181/96 | HR 92 | Temp 98.6°F | Ht 64.0 in | Wt 113.2 lb

## 2020-10-26 DIAGNOSIS — K5904 Chronic idiopathic constipation: Secondary | ICD-10-CM

## 2020-10-26 MED ORDER — LINACLOTIDE 72 MCG PO CAPS: 72.0000 ug | ORAL_CAPSULE | Freq: Every day | ORAL | 0 refills | Status: DC

## 2020-10-26 NOTE — Progress Notes (Signed)
Cephas Darby, MD [address]  [place]  [place], [place] [address]  Main: [phone]  Fax: [phone]    Gastroenterology Consultation  Referring Provider:     Barbaraann Boys, MD Primary Care Physician:  Barbaraann Boys, MD Primary Gastroenterologist:  Dr. Cephas Darby Reason for Consultation:     Chronic constipation, nausea, weight loss        HPI:   Brooke Jenkins is a 65 y.o. female referred by Dr. Barbaraann Boys, MD  for consultation & management of chronic constipation, left lower quadrant pain.  She was referred by urology to manage constipation as patient has urinary symptoms for the last 1 month, frequent urination.  She underwent complete evaluation by urology who felt her urinary symptoms are most likely secondary to uncontrolled constipation.  Patient reports that she has been experiencing this problem almost all her life.  She has BM every 3-4 days with significant straining, stools are hard sometimes need manual disimpaction.  She also reports left lower quadrant, sharp, crampy pain, bloating that started about a month ago.  She denies fever, chills, nausea or vomiting, rectal bleeding, blood mixed with stool, diarrhea.  She tried milk of magnesia She has not seen GI for this. She tried to drink lots of water She tried miralax as needed She is afraid to eat fruits and vegetables as her left lower quadrant pain is worse She does eat a bowl of cereal daily She also has history of hemorrhoidectomy but notices protrusion of soft tissue after a BM She has history of chronic hep C, status post treatment, confirmed eradication  Follow-up visit 05/05/2019 Patient reports that current dose of Linzess 72 MCG has been intermittently helping with constipation.  She has severe insomnia, anxiety and depression for which she had a change in her medications.  Currently on Klonopin, Cymbalta as well as a new medication called hetlioz which is a melatonin receptor agonist  for insomnia.  She also reports feeling tired during the day, dizzy when she wakes up.  She was told that her iron levels are low and has to take oral iron.  She is currently taking 65 mg of iron daily.  Patient reports experiencing nausea limiting p.o. intake, lack of appetite for the last 3 weeks.  She completely eliminated sweetened tea which she used to drink regularly and since then, she lost about 15 pounds and she currently weighs 100 pounds.  Patient claims that this is her actual her weight prior to consuming sweetened tea.  She is recommended to take omeprazole over-the-counter for nausea and she thinks it is providing some relief.  Follow-up visit 10/26/20 Patient is here for follow-up of chronic constipation, has hard bowel movements every 4 days.  She has been taking laxatives, stool softeners which only resulted in abdominal cramps, not having BM.Marland Kitchen  She has not seen me for more than 1 year.  She used to take Linzess, has ran out of it.  She does acknowledge that her diet is poorly nutritious and unhealthy, totally devoid of fiber.  Her weight has been stable, in fact she is concerned about gaining weight.  She does report intermittent nausea.  She lives alone, does not like to cook for herself.  She does continue to smoke cigarettes.  NSAIDs: None  Antiplts/Anticoagulants/Anti thrombotics: None  GI Procedures:  EGD and colonoscopy 05/19/2019 - Normal duodenal bulb and second portion of the duodenum. Biopsied. - Erythematous mucosa in the stomach. Biopsied. - Small hiatal hernia. -  Normal gastroesophageal junction and esophagus.  - Hemorrhoids found on perianal exam. - One 6 mm polyp in the descending colon, removed with a cold snare. Resected and retrieved. - Diverticulosis in the sigmoid colon. 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 - Non-bleeding external hemorrhoids.  DIAGNOSIS:  A. DUODENUM; COLD BIOPSY:  - UNREMARKABLE DUODENAL MUCOSA.  - NEGATIVE FOR FEATURES OF CELIAC  DISEASE.  - NEGATIVE FOR DYSPLASIA AND MALIGNANCY.   B. STOMACH; COLD BIOPSY:  - CHRONIC NONSPECIFIC GASTRITIS.  - NEGATIVE FOR ACTIVE INFLAMMATION AND H PYLORI.  - NEGATIVE FOR INTESTINAL METAPLASIA, DYSPLASIA, AND MALIGNANCY.   C. COLON POLYP, DESCENDING; COLD SNARE:  - TUBULAR ADENOMA.  - NEGATIVE FOR HIGH-GRADE DYSPLASIA AND MALIGNANCY.  Had an EGD and colonoscopy in 02/2016 at [facility] Actual report not available A. Stomach, endoscopic biopsies:  Gastric oxyntic mucosa with no significant pathologic diagnosis.  B. Right colon polyps, endoscopic polypectomy: Tubular adenoma. Hyperplastic polyp.  Past Medical History:  Diagnosis Date  . Anxiety   . Arthritis   . Cataracts, bilateral   . Chronic hepatitis C ([place]) 04/25/2010  . Depression   . History of hepatitis   . Insomnia   . Kidney stones   . Tinnitus     Past Surgical History:  Procedure Laterality Date  . CHOLECYSTECTOMY    . COLONOSCOPY WITH PROPOFOL N/A 05/19/2019   Procedure: COLONOSCOPY WITH PROPOFOL;  Surgeon: Lin Landsman, MD;  Location: [facility] ENDOSCOPY;  Service: Gastroenterology;  Laterality: N/A;  . ESOPHAGOGASTRODUODENOSCOPY (EGD) WITH PROPOFOL N/A 05/19/2019   Procedure: ESOPHAGOGASTRODUODENOSCOPY (EGD) WITH PROPOFOL;  Surgeon: Lin Landsman, MD;  Location: [facility] ENDOSCOPY;  Service: Gastroenterology;  Laterality: N/A;  . HEMORROIDECTOMY    . KIDNEY STONE SURGERY    . TEMPOROMANDIBULAR JOINT SURGERY      Current Outpatient Medications:  .  acyclovir (ZOVIRAX) 400 MG tablet, TAKE 1 TABLET BY MOUTH 3 TIMES PER DAY FOR 5 DAYS AT FIRST SIGN OF OUTBREAK. DO NOT TAKE WITH ZANAFLEX, Disp: , Rfl:  .  butalbital-acetaminophen-caffeine (FIORICET) 50-325-40 MG tablet, Take 1 tablet by mouth every 6 (six) hours as needed., Disp: , Rfl:  .  cetirizine (ZYRTEC) 10 MG tablet, Take by mouth., Disp: , Rfl:  .  clonazePAM (KLONOPIN) 1 MG tablet, , Disp: , Rfl:  .  cloNIDine (CATAPRES) 0.2 MG tablet, Take by  mouth., Disp: , Rfl:  .  cyclobenzaprine (FLEXERIL) 5 MG tablet, Take 1 tablet by mouth 3 (three) times daily as needed., Disp: , Rfl:  .  DULoxetine (CYMBALTA) 60 MG capsule, , Disp: , Rfl:  .  linaclotide (LINZESS) 72 MCG capsule, Take 1 capsule (72 mcg total) by mouth daily before breakfast., Disp: 90 capsule, Rfl: 0 .  methylphenidate (RITALIN) 20 MG tablet, Take by mouth., Disp: , Rfl:  .  pregabalin (LYRICA) 75 MG capsule, Take by mouth., Disp: , Rfl:   History reviewed. No pertinent family history.   Social History   Tobacco Use  . Smoking status: Current Every Day Smoker    Packs/day: 0.25    Years: 40.00    Pack years: 10.00    Types: Cigarettes  . Smokeless tobacco: Never Used  Vaping Use  . Vaping Use: Never used  Substance Use Topics  . Alcohol use: No  . Drug use: Not Currently    Allergies as of 10/26/2020 - Review Complete 10/26/2020  Allergen Reaction Noted  . Mirabegron Shortness Of Breath, Other (See Comments), and Cough 01/20/2019  . Amoxicillin-pot clavulanate Nausea And  Vomiting 08/23/2016  . Aspirin Other (See Comments) 01/06/2013  . Ciprofloxacin Nausea And Vomiting 05/17/2017  . Tramadol Nausea And Vomiting 10/07/2018  . Vicodin [hydrocodone-acetaminophen] Itching 12/17/2014    Review of Systems:    All systems reviewed and negative except where noted in HPI.   Physical Exam:  BP (!) 181/96 (BP Location: Left Arm, Patient Position: Sitting, Cuff Size: Normal)   Pulse 92   Temp 98.6 F (37 C) (Oral)   Ht 5\' 4"  (1.626 m)   Wt 113 lb 4 oz (51.4 kg)   BMI 19.44 kg/m  No LMP recorded. Patient is postmenopausal.  General:   Alert,  Well-developed, well-nourished, pleasant and cooperative in NAD Head:  Normocephalic and atraumatic. Eyes:  Sclera clear, no icterus.   Conjunctiva pink. Ears:  Normal auditory acuity. Nose:  No deformity, discharge, or lesions. Mouth:  No deformity or lesions,oropharynx pink & moist. Neck:  Supple; no masses or  thyromegaly. Lungs:  Respirations even and unlabored.  Clear throughout to auscultation.   No wheezes, crackles, or rhonchi. No acute distress. Heart:  Regular rate and rhythm; no murmurs, clicks, rubs, or gallops. Abdomen:  Normal bowel sounds. Soft, mildly distended, tympanic to percussion without masses, hepatosplenomegaly or hernias noted.  No guarding or rebound tenderness.   Rectal: Not examined Msk:  Symmetrical without gross deformities. Good, equal movement & strength bilaterally. Pulses:  Normal pulses noted. Extremities:  No clubbing or edema.  No cyanosis. Neurologic:  Alert and oriented x3;  grossly normal neurologically. Skin:  Intact without significant lesions or rashes. No jaundice. Psych:  Alert and cooperative. Normal mood and affect.  Imaging Studies: Reviewed  Assessment and Plan:   Brooke Jenkins is a 66 y.o. female is seen in consultation for chronic constipation, intermittent nausea  Chronic constipation Continue Linzess 72MCG daily Advised her about high-fiber diet, fiber supplements, Information provided  History of colon adenomas Recommend surveillance colonoscopy in 2025  Follow up in 3 months   Cephas Darby, MD

## 2020-10-26 NOTE — Patient Instructions (Signed)
High-Fiber Eating Plan Fiber, also called dietary fiber, is a type of carbohydrate. It is found foods such as fruits, vegetables, whole grains, and beans. A high-fiber diet can have many health benefits. Your health care provider may recommend a high-fiber diet to help:  Prevent constipation. Fiber can make your bowel movements more regular.  Lower your cholesterol.  Relieve the following conditions: ? Inflammation of veins in the anus (hemorrhoids). ? Inflammation of specific areas of the digestive tract (uncomplicated diverticulosis). ? A problem of the large intestine, also called the colon, that sometimes causes pain and diarrhea (irritable bowel syndrome, or IBS).  Prevent overeating as part of a weight-loss plan.  Prevent heart disease, type 2 diabetes, and certain cancers. What are tips for following this plan? Reading food labels  Check the nutrition facts label on food products for the amount of dietary fiber. Choose foods that have 5 grams of fiber or more per serving.  The goals for recommended daily fiber intake include: ? Men (age 50 or younger): 34-38 g. ? Men (over age 50): 28-34 g. ? Women (age 50 or younger): 25-28 g. ? Women (over age 50): 22-25 g. Your daily fiber goal is _____________ g.   Shopping  Choose whole fruits and vegetables instead of processed forms, such as apple juice or applesauce.  Choose a wide variety of high-fiber foods such as avocados, lentils, oats, and kidney beans.  Read the nutrition facts label of the foods you choose. Be aware of foods with added fiber. These foods often have high sugar and sodium amounts per serving. Cooking  Use whole-grain flour for baking and cooking.  Cook with brown rice instead of white rice. Meal planning  Start the day with a breakfast that is high in fiber, such as a cereal that contains 5 g of fiber or more per serving.  Eat breads and cereals that are made with whole-grain flour instead of refined  flour or white flour.  Eat brown rice, bulgur wheat, or millet instead of white rice.  Use beans in place of meat in soups, salads, and pasta dishes.  Be sure that half of the grains you eat each day are whole grains. General information  You can get the recommended daily intake of dietary fiber by: ? Eating a variety of fruits, vegetables, grains, nuts, and beans. ? Taking a fiber supplement if you are not able to take in enough fiber in your diet. It is better to get fiber through food than from a supplement.  Gradually increase how much fiber you consume. If you increase your intake of dietary fiber too quickly, you may have bloating, cramping, or gas.  Drink plenty of water to help you digest fiber.  Choose high-fiber snacks, such as berries, raw vegetables, nuts, and popcorn. What foods should I eat? Fruits Berries. Pears. Apples. Oranges. Avocado. Prunes and raisins. Dried figs. Vegetables Sweet potatoes. Spinach. Kale. Artichokes. Cabbage. Broccoli. Cauliflower. Green peas. Carrots. Squash. Grains Whole-grain breads. Multigrain cereal. Oats and oatmeal. Brown rice. Barley. Bulgur wheat. Millet. Quinoa. Bran muffins. Popcorn. Rye wafer crackers. Meats and other proteins Navy beans, kidney beans, and pinto beans. Soybeans. Split peas. Lentils. Nuts and seeds. Dairy Fiber-fortified yogurt. Beverages Fiber-fortified soy milk. Fiber-fortified orange juice. Other foods Fiber bars. The items listed above may not be a complete list of recommended foods and beverages. Contact a dietitian for more information. What foods should I avoid? Fruits Fruit juice. Cooked, strained fruit. Vegetables Fried potatoes. Canned vegetables. Well-cooked vegetables. Grains   White bread. Pasta made with refined flour. White rice. Meats and other proteins Fatty cuts of meat. Fried chicken or fried fish. Dairy Milk. Yogurt. Cream cheese. Sour cream. Fats and oils Butters. Beverages Soft  drinks. Other foods Cakes and pastries. The items listed above may not be a complete list of foods and beverages to avoid. Talk with your dietitian about what choices are best for you. Summary  Fiber is a type of carbohydrate. It is found in foods such as fruits, vegetables, whole grains, and beans.  A high-fiber diet has many benefits. It can help to prevent constipation, lower blood cholesterol, aid weight loss, and reduce your risk of heart disease, diabetes, and certain cancers.  Increase your intake of fiber gradually. Increasing fiber too quickly may cause cramping, bloating, and gas. Drink plenty of water while you increase the amount of fiber you consume.  The best sources of fiber include whole fruits and vegetables, whole grains, nuts, seeds, and beans. This information is not intended to replace advice given to you by your health care provider. Make sure you discuss any questions you have with your health care provider. Document Revised: 11/06/2019 Document Reviewed: 11/06/2019 Elsevier Patient Education  2021 [employer] Inc.  

## 2020-12-19 IMAGING — CT CT CERVICAL SPINE W/O CM
3 of 4 series · 12 of 33 positions shown, 14 images · non-contrast
Comparison: Head and cervical CT May 17, 2017

CLINICAL DATA: Headache.  Status post fall 5 days ago.

EXAM:
CT HEAD WITHOUT CONTRAST
CT CERVICAL SPINE WITHOUT CONTRAST
TECHNIQUE: Multidetector CT imaging of the head and cervical spine was
performed following the standard protocol without intravenous
contrast. Multiplanar CT image reconstructions of the cervical spine
were also generated.

[Series 4: sagittal bone · sagittal · 0.25mm/px · 5 of 48 slices shown, 6 images]
[im 16/48  bone]
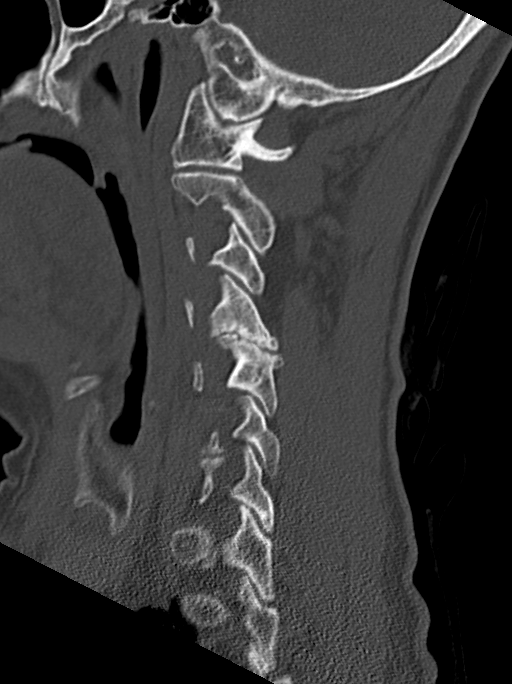
[im 20/48  bone]
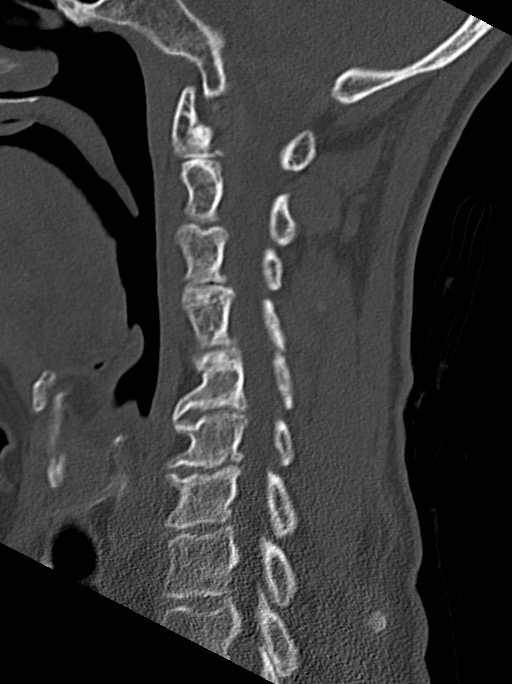
[im 24/48  soft-tissue]
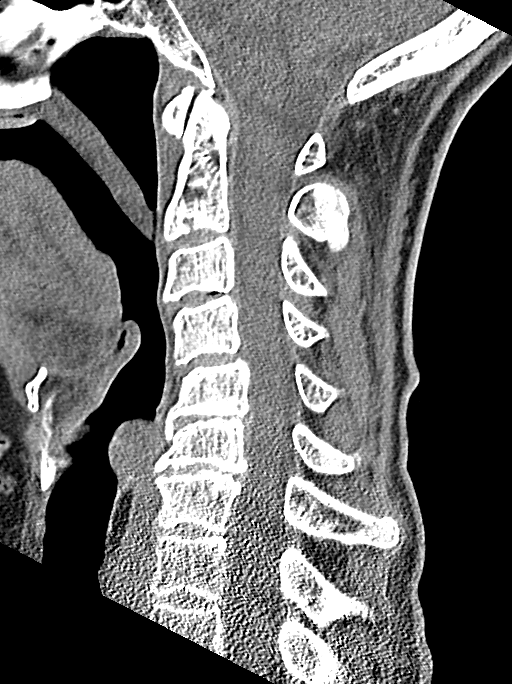
[im 24/48  bone]
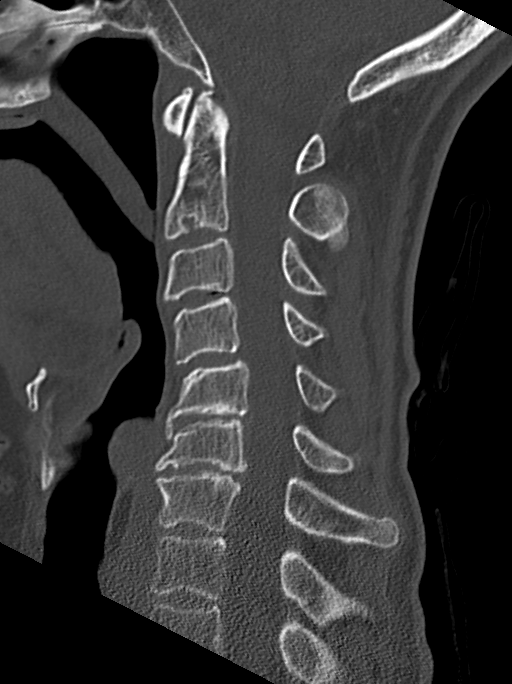
[im 28/48  bone]
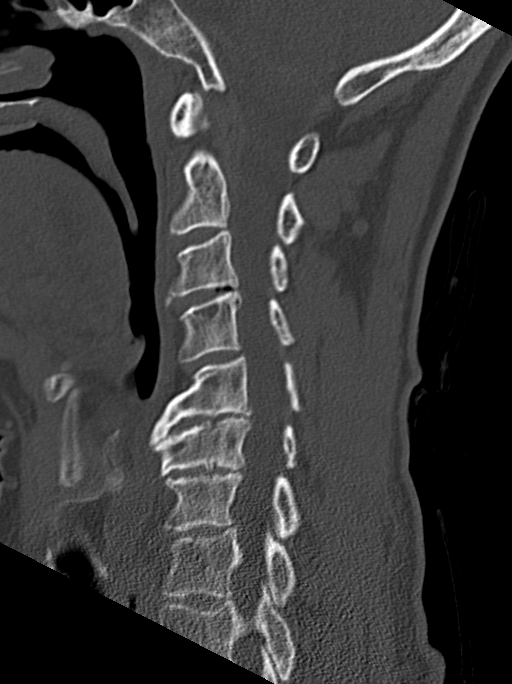
[im 32/48  bone]
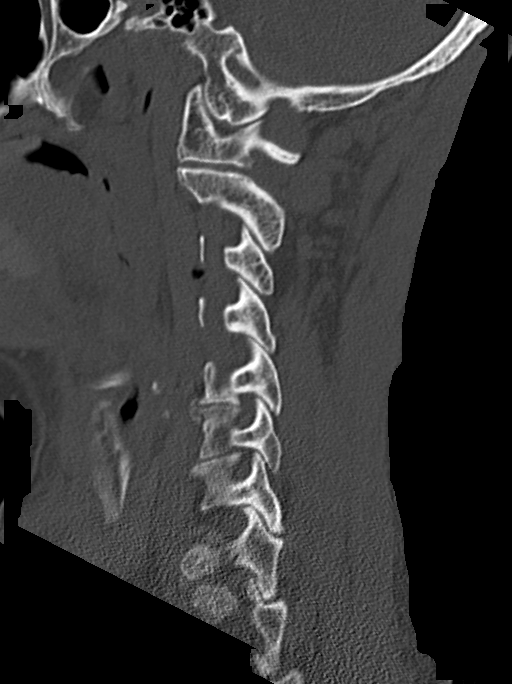

[Series 5: coronal bone · coronal · 0.21mm/px · 3 of 48 slices shown]
[im 10/48  bone]
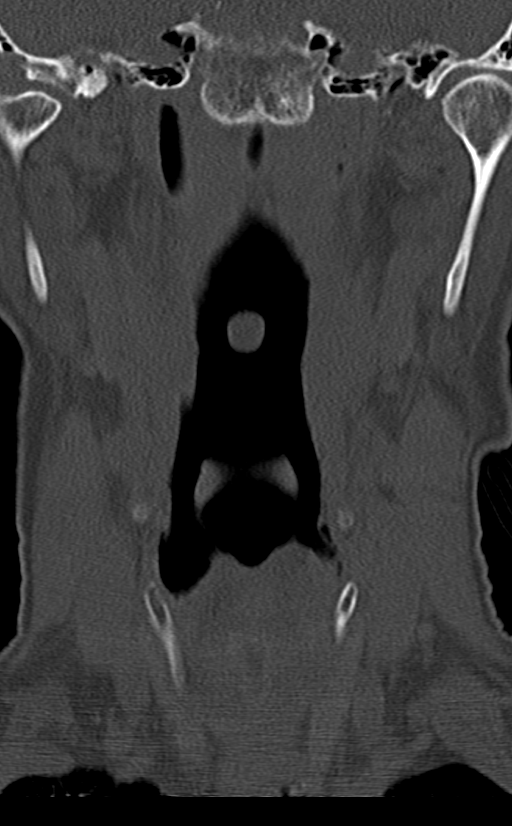
[im 19/48  bone]
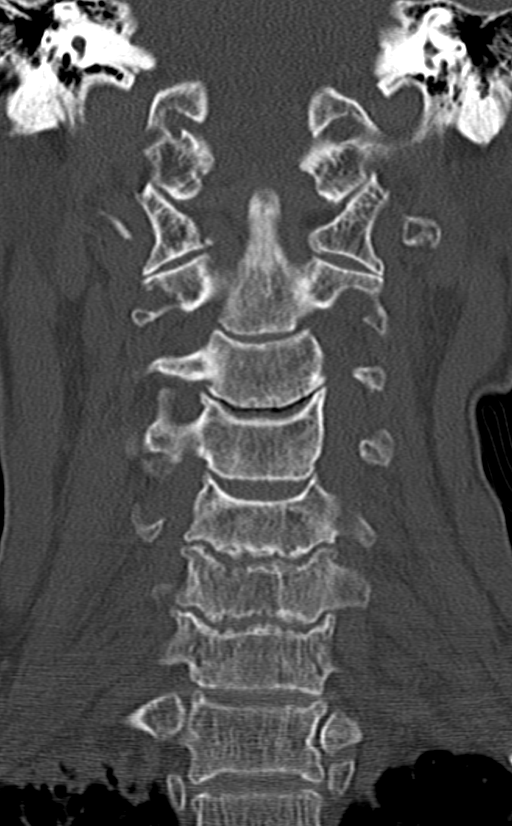
[im 29/48  bone]
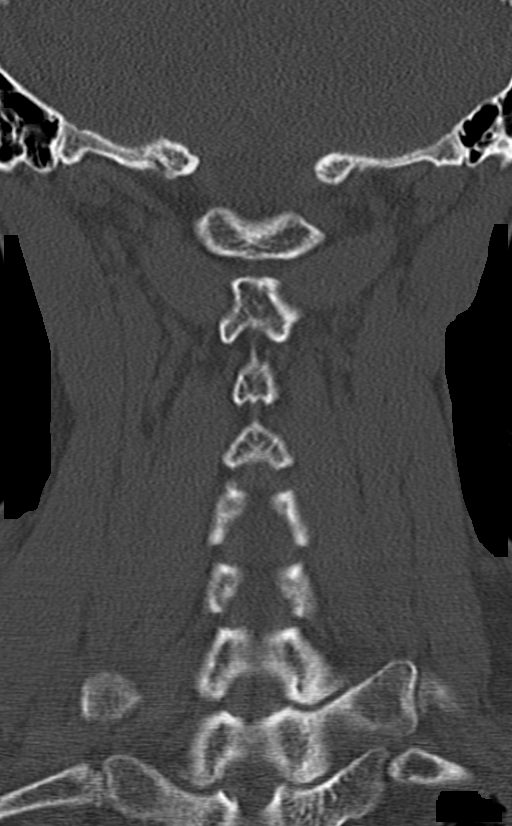

[Series 6: orthogonal bone · axial · 0.23mm/px · z∈[+1441,+1543]mm · 4 of 85 slices shown, 5 images]
[im 15/85  soft-tissue]
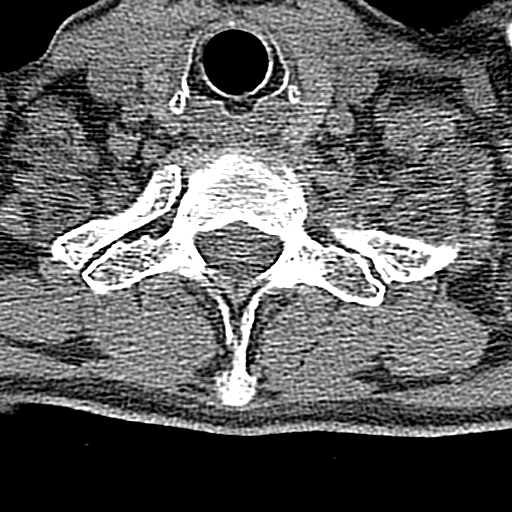
[im 15/85  bone]
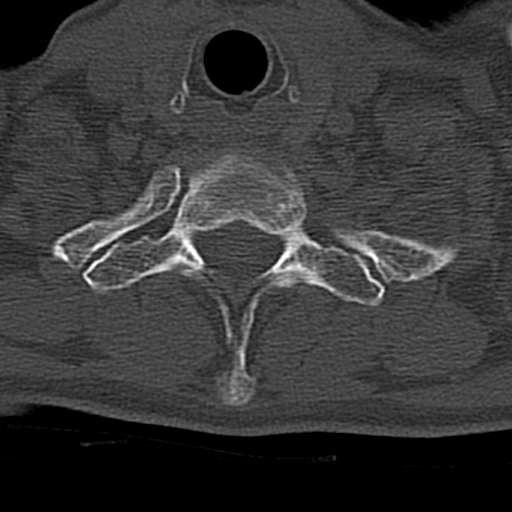
[im 29/85  bone]
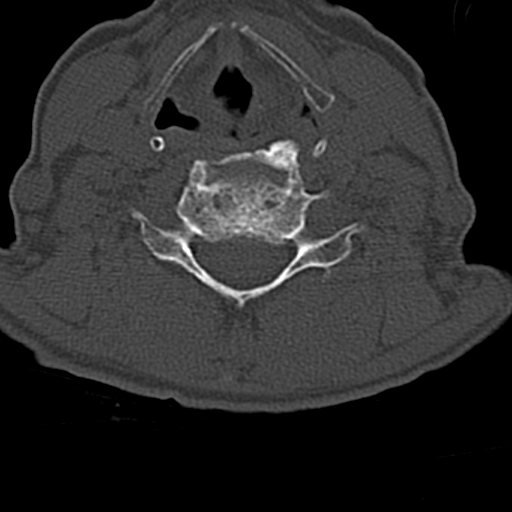
[im 57/85  bone]
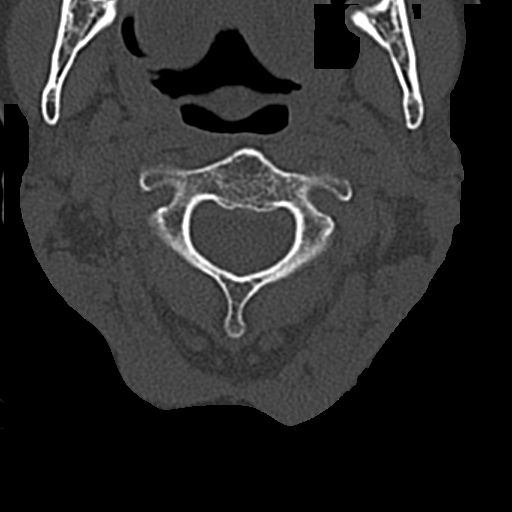
[im 71/85  bone]
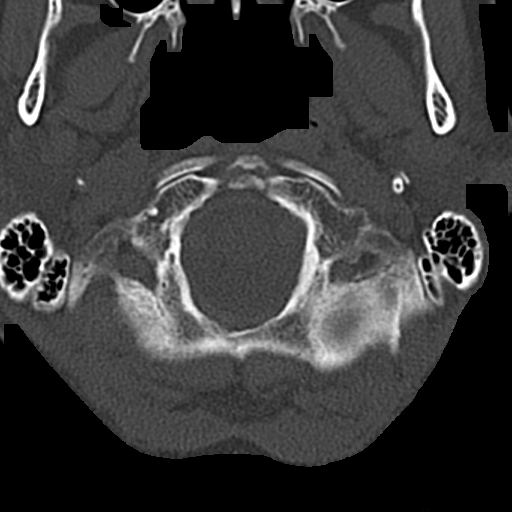

[12 of 33 positions shown; findings below may reference images not displayed]

FINDINGS: CT HEAD FINDINGS

Brain: No evidence of acute infarction, hemorrhage, hydrocephalus,
extra-axial collection or mass lesion/mass effect.

Vascular: No hyperdense vessel is noted.

Skull: Normal. Negative for fracture or focal lesion.

Sinuses/Orbits: Mild mucoperiosteal thickening of right ethmoid
sinus is noted.

Other: None.

CT CERVICAL SPINE FINDINGS

Alignment: There is straightening of cervical spine either due to
muscle spasm or positioning.

Skull base and vertebrae: No acute fracture. No primary bone lesion
or focal pathologic process.

Soft tissues and spinal canal: No prevertebral fluid or swelling. No
visible canal hematoma.

Disc levels: Degenerative joint changes with osteophyte formation
and narrowed joint space are identified in the mid to lower cervical
spine.

Upper chest: Biapical scar is noted.

Other: None.
IMPRESSION: No focal acute intracranial abnormality identified.

No acute fracture or dislocation of cervical spine.

## 2021-01-18 ENCOUNTER — Emergency Department
Admission: EM | Admit: 2021-01-18 | Discharge: 2021-01-18 | Disposition: A | Discharge status: Home / Self Care | Payer: Medicare HMO | Attending: Emergency Medicine | Admitting: Emergency Medicine

## 2021-01-18 ENCOUNTER — Other Ambulatory Visit: Payer: Self-pay

## 2021-01-18 ENCOUNTER — Emergency Department: Payer: Medicare HMO

## 2021-01-18 DIAGNOSIS — M6281 Muscle weakness (generalized): Secondary | ICD-10-CM | POA: Diagnosis present

## 2021-01-18 DIAGNOSIS — M21331 Wrist drop, right wrist: Secondary | ICD-10-CM | POA: Insufficient documentation

## 2021-01-18 DIAGNOSIS — R791 Abnormal coagulation profile: Secondary | ICD-10-CM | POA: Diagnosis not present

## 2021-01-18 DIAGNOSIS — F17210 Nicotine dependence, cigarettes, uncomplicated: Secondary | ICD-10-CM | POA: Insufficient documentation

## 2021-01-18 DIAGNOSIS — R202 Paresthesia of skin: Secondary | ICD-10-CM | POA: Diagnosis present

## 2021-01-18 LAB — DIFFERENTIAL
Abs Immature Granulocytes: 0.03 10*3/uL (ref 0.00–0.07)
Basophils Absolute: 0.1 10*3/uL (ref 0.0–0.1)
Basophils Relative: 1 %
Eosinophils Absolute: 0.1 10*3/uL (ref 0.0–0.5)
Eosinophils Relative: 1 %
Immature Granulocytes: 0 %
Lymphocytes Relative: 31 %
Lymphs Abs: 2.3 10*3/uL (ref 0.7–4.0)
Monocytes Absolute: 0.5 10*3/uL (ref 0.1–1.0)
Monocytes Relative: 6 %
Neutro Abs: 4.5 10*3/uL (ref 1.7–7.7)
Neutrophils Relative %: 61 %

## 2021-01-18 LAB — COMPREHENSIVE METABOLIC PANEL
ALT: 11 U/L (ref 0–44)
AST: 18 U/L (ref 15–41)
Albumin: 4.3 g/dL (ref 3.5–5.0)
Alkaline Phosphatase: 88 U/L (ref 38–126)
Anion gap: 6 (ref 5–15)
BUN: 14 mg/dL (ref 8–23)
CO2: 31 mmol/L (ref 22–32)
Calcium: 10 mg/dL (ref 8.9–10.3)
Chloride: 102 mmol/L (ref 98–111)
Creatinine, Ser: 0.82 mg/dL (ref 0.44–1.00)
GFR, Estimated: >60 mL/min (ref >60)
Glucose, Bld: 101 mg/dL — ABNORMAL HIGH (ref 70–99)
Potassium: 4.2 mmol/L (ref 3.5–5.1)
Sodium: 139 mmol/L (ref 135–145)
Total Bilirubin: 0.2 mg/dL — ABNORMAL LOW (ref 0.3–1.2)
Total Protein: 7.3 g/dL (ref 6.5–8.1)

## 2021-01-18 LAB — CBC
HCT: 42 % (ref 36.0–46.0)
Hemoglobin: 13.9 g/dL (ref 12.0–15.0)
MCH: 32.3 pg (ref 26.0–34.0)
MCHC: 33.1 g/dL (ref 30.0–36.0)
MCV: 97.7 fL (ref 80.0–100.0)
Platelets: 235 10*3/uL (ref 150–400)
RBC: 4.3 MIL/uL (ref 3.87–5.11)
RDW: 12.7 % (ref 11.5–15.5)
WBC: 7.3 10*3/uL (ref 4.0–10.5)
nRBC: 0 % (ref 0.0–0.2)

## 2021-01-18 LAB — APTT: aPTT: 27 seconds (ref 24–36)

## 2021-01-18 LAB — CREATININE, SERUM
Creatinine, Ser: 0.91 mg/dL (ref 0.44–1.00)
GFR, Estimated: >60 mL/min (ref >60)

## 2021-01-18 LAB — PROTIME-INR
INR: 1 (ref 0.8–1.2)
Prothrombin Time: 13.3 seconds (ref 11.4–15.2)

## 2021-01-18 MED ORDER — PREDNISONE 20 MG PO TABS
40.0000 mg | ORAL_TABLET | Freq: Once | ORAL | Status: DC
  Filled 2021-01-18: qty 2

## 2021-01-18 MED ORDER — SODIUM CHLORIDE 0.9% FLUSH: 3.0000 mL | Freq: Once | INTRAVENOUS | Status: DC

## 2021-01-18 MED ORDER — PREDNISONE 20 MG PO TABS: 40.0000 mg | ORAL_TABLET | Freq: Every day | ORAL | 0 refills | Status: AC

## 2021-01-18 NOTE — Discharge Instructions (Addendum)
Start the steroid as prescribed  Wear the wrist splint when sleeping and throughout the day.  You can take this off to shower.  Follow-up with neurology within the next 3 to 5 days.

## 2021-01-18 NOTE — ED Provider Notes (Signed)
[facility] Emergency Department Provider Note  ____________________________________________   Event Date/Time   First MD Initiated Contact with Patient 01/18/21 1904     (approximate)  I have reviewed the triage vital signs and the nursing notes.   HISTORY  Chief Complaint Numbness    HPI Brooke Jenkins is a 65 y.o. female with past medical history as below here with right arm weakness.  The patient states that 2 days ago, she woke up and her right hand was essentially unable to be moved.  It has remained weak since then.  She has difficulty with any kind of finger movement or wrist extension or flexion.  This is new.  She denies any preceding illness.  She slept in her bed with no position changes or trauma the night before.  No recent medication changes.  No history of similar injuries.  She does have a history of cervical neck issues but denies any recent increase in neck pain.  Denies any numbness.  No left upper or bilateral lower extremity symptoms.  No vision changes.  No focal numbness.    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/06/2017   Nephrolithiasis 03/28/2016   DDD (degenerative disc disease), cervical 01/12/2016   Tinnitus, bilateral 01/12/2016   ADD (attention deficit disorder) 12/10/2015   Chronic idiopathic constipation 12/07/2015   Depression, major, recurrent, 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   GERD (gastroesophageal reflux  disease) 12/07/2015   Nicotine dependence, uncomplicated 83/38/2505   Chronic insomnia 12/07/2015   Anxiety disorder 12/26/2013   Menopausal hot flushes 05/08/2013   Postmenopausal HRT (hormone replacement therapy) 05/08/2013   Osteoarthritis, hand 02/12/2013   Acute upper respiratory infection 03/03/2012   Acute midline low back pain without sciatica 04/11/2011   Neck pain 04/11/2011   Complete rupture of rotator cuff 10/06/2010   Depressive disorder 04/25/2010    Past Surgical History:  Procedure Laterality Date   CHOLECYSTECTOMY     COLONOSCOPY WITH PROPOFOL N/A 05/19/2019   Procedure: COLONOSCOPY WITH PROPOFOL;  Surgeon: Lin Landsman, MD;  Location: [facility] ENDOSCOPY;  Service: Gastroenterology;  Laterality: N/A;   ESOPHAGOGASTRODUODENOSCOPY (EGD) WITH PROPOFOL N/A 05/19/2019   Procedure: ESOPHAGOGASTRODUODENOSCOPY (EGD) WITH PROPOFOL;  Surgeon: Lin Landsman, MD;  Location: [facility] ENDOSCOPY;  Service: Gastroenterology;  Laterality: N/A;   HEMORROIDECTOMY     KIDNEY STONE SURGERY     TEMPOROMANDIBULAR JOINT SURGERY      Prior to Admission medications   Medication Sig Start Date End Date Taking? Authorizing Provider  predniSONE (DELTASONE) 20 MG tablet Take 2 tablets (40 mg total) by mouth daily for 5 days. 01/18/21 01/23/21 Yes Duffy Bruce, MD  acyclovir (ZOVIRAX) 400 MG tablet TAKE 1 TABLET BY MOUTH 3 TIMES PER DAY FOR 5 DAYS AT FIRST SIGN OF OUTBREAK. DO NOT TAKE WITH ZANAFLEX 05/01/19   [provider]  butalbital-acetaminophen-caffeine (FIORICET) 50-325-40 MG tablet Take 1  tablet by mouth every 6 (six) hours as needed. 09/23/20   [provider]  cetirizine (ZYRTEC) 10 MG tablet Take by mouth. 10/18/18 10/26/20  [provider]  clonazePAM Bobbye [place]) 1 MG tablet  04/28/19   [provider]  cloNIDine (CATAPRES) 0.2 MG tablet Take by mouth. 01/03/18   [provider]  cyclobenzaprine (FLEXERIL) 5 MG tablet Take 1 tablet by mouth  3 (three) times daily as needed. 09/30/20   [provider]  DULoxetine (CYMBALTA) 60 MG capsule  03/03/19   [provider]  linaclotide Rolan Lipa) 72 MCG capsule Take 1 capsule (72 mcg total) by mouth daily before breakfast. 10/26/20 01/24/21  Lin Landsman, MD  methylphenidate (RITALIN) 20 MG tablet Take by mouth.    [provider]  pregabalin (LYRICA) 75 MG capsule Take by mouth. 09/02/19   [provider]    Allergies Mirabegron, Amoxicillin-pot clavulanate, Aspirin, Ciprofloxacin, Tramadol, and Vicodin [hydrocodone-acetaminophen]  No family history on file.  Social History Social History   Tobacco Use   Smoking status: Every Day    Packs/day: 0.25    Years: 40.00    Pack years: 10.00    Types: Cigarettes   Smokeless tobacco: Never  Vaping Use   Vaping Use: Never used  Substance Use Topics   Alcohol use: No   Drug use: Not Currently    Review of Systems  Review of Systems  Constitutional:  Negative for fatigue and fever.  HENT:  Negative for congestion and sore throat.   Eyes:  Negative for visual disturbance.  Respiratory:  Negative for cough and shortness of breath.   Cardiovascular:  Negative for chest pain.  Gastrointestinal:  Negative for abdominal pain, diarrhea, nausea and vomiting.  Genitourinary:  Negative for flank pain.  Musculoskeletal:  Negative for back pain and neck pain.  Skin:  Negative for rash and wound.  Neurological:  Positive for weakness.  All other systems reviewed and are negative.   ____________________________________________  PHYSICAL EXAM:      VITAL SIGNS: ED Triage Vitals  Enc Vitals Group     BP 01/18/21 1628 97/62     Pulse Rate 01/18/21 1628 (!) 58     Resp 01/18/21 1628 18     Temp 01/18/21 1628 98 F (36.7 C)     Temp src --      SpO2 01/18/21 1628 100 %     Weight --      Height --      Head Circumference --      Peak Flow --      Pain Score 01/18/21 1624 0     Pain Loc --       Pain Edu? --      Excl. in [place]? --      Physical Exam Vitals and nursing note reviewed.  Constitutional:      General: She is not in acute distress.    Appearance: She is well-developed.  HENT:     Head: Normocephalic and atraumatic.  Eyes:     Conjunctiva/sclera: Conjunctivae normal.  Cardiovascular:     Rate and Rhythm: Normal rate and regular rhythm.     Heart sounds: Normal heart sounds.  Pulmonary:     Effort: Pulmonary effort is normal. No respiratory distress.     Breath sounds: No wheezing.  Abdominal:     General: There is no distension.  Musculoskeletal:     Cervical back: Neck supple.  Skin:    General: Skin is  warm.     Capillary Refill: Capillary refill takes less than 2 seconds.     Findings: No rash.  Neurological:     Mental Status: She is alert and oriented to person, place, and time.     Motor: No abnormal muscle tone.     Comments: Neurological Exam:  Mental Status: Alert and oriented to person, place, and time. Attention and concentration normal. Speech clear. Recent memory is intact. Cranial Nerves: Visual fields grossly intact. EOMI and PERRLA. No nystagmus noted. Facial sensation intact at forehead, maxillary cheek, and chin/mandible bilaterally. No facial asymmetry or weakness. Hearing grossly normal. Uvula is midline, and palate elevates symmetrically. Normal SCM and trapezius strength. Tongue midline without fasciculations. Motor: Muscle strength 5/5 in proximal and distal UE and LE bilaterally, with exception of right wrist/hand. Strength 2/5 with wrist extension, flexion, abduction/adduction, finger extensino, flexion, and thumb movement. No tremors/fasciculations. No pronator drift. Muscle tone normal. Sensation: Intact to light touch in upper and lower extremities distally bilaterally.  Gait: Normal without ataxia. Coordination: Normal FTN bilaterally.   '       ____________________________________________   LABS (all labs ordered are  listed, but only abnormal results are displayed)  Labs Reviewed  COMPREHENSIVE METABOLIC PANEL - Abnormal; Notable for the following components:      Result Value   Glucose, Bld 101 (*)    Total Bilirubin 0.2 (*)    All other components within normal limits  PROTIME-INR  APTT  CBC  DIFFERENTIAL  CREATININE, SERUM  I-STAT CREATININE, ED  CBG MONITORING, ED    ____________________________________________  EKG: Sinus bradycardia, ventricular rate 53.  PR 138, QRS 86, QTc 369.  No acute ST elevations or depressions.  No acute evidence of acute ischemia 4. ________________________________________  RADIOLOGY All imaging, including plain films, CT scans, and ultrasounds, independently reviewed by me, and interpretations confirmed via formal radiology reads.  ED MD interpretation:   CT head: No acute abnormality, stable mild atrophy  Official radiology report(s): CT HEAD WO CONTRAST  Result Date: 01/18/2021 CLINICAL DATA:  Right hand numbness. EXAM: CT HEAD WITHOUT CONTRAST TECHNIQUE: Contiguous axial images were obtained from the base of the skull through the vertex without intravenous contrast. COMPARISON:  Head CT 11/16/2019, brain MRI 02/12/2020 FINDINGS: Brain: Stable degree of mild atrophy. No intracranial hemorrhage, mass effect, or midline shift. No hydrocephalus. The basilar cisterns are patent. No evidence of territorial infarct or acute ischemia. No extra-axial or intracranial fluid collection. Vascular: No hyperdense vessel. Skull: No fracture or focal lesion. Sinuses/Orbits: Chronic opacification of right ethmoid air cells. No acute findings. The mastoid air cells are clear. No acute orbital findings. Other: None. IMPRESSION: 1. No acute intracranial abnormality. 2. Stable mild atrophy. Electronically Signed   By: Keith Rake M.D.   On: 01/18/2021 17:08   MR BRAIN WO CONTRAST  Result Date: 01/18/2021 CLINICAL DATA:  Stroke like symptoms EXAM: MRI HEAD WITHOUT CONTRAST  TECHNIQUE: Multiplanar, multiecho pulse sequences of the brain and surrounding structures were obtained without intravenous contrast. COMPARISON:  None. FINDINGS: Brain: No acute infarct, mass effect or extra-axial collection. No acute or chronic hemorrhage. Normal white matter signal, parenchymal volume and CSF spaces. The midline structures are normal. Vascular: Major flow voids are preserved. Skull and upper cervical spine: Normal calvarium and skull base. Visualized upper cervical spine and soft tissues are normal. Sinuses/Orbits:No paranasal sinus fluid levels or advanced mucosal thickening. No mastoid or middle ear effusion. Normal orbits. IMPRESSION: Normal brain MRI. Electronically Signed  By: Ulyses Jarred M.D.   On: 01/18/2021 22:03   MR Cervical Spine Wo Contrast  Result Date: 01/18/2021 CLINICAL DATA:  Right hand numbness EXAM: MRI CERVICAL SPINE WITHOUT CONTRAST TECHNIQUE: Multiplanar, multisequence MR imaging of the cervical spine was performed. No intravenous contrast was administered. COMPARISON:  None. FINDINGS: Alignment: Reversal of normal cervical lordosis Vertebrae: No fracture, evidence of discitis, or bone lesion. Cord: Normal signal and morphology. Posterior Fossa, vertebral arteries, paraspinal tissues: Negative. Disc levels: C1-2: Unremarkable. C2-3: Small disc bulge. There is no spinal canal stenosis. No neural foraminal stenosis. C3-4: Small disc bulge with left-greater-than-right uncovertebral hypertrophy. There is no spinal canal stenosis. Unchanged mild left neural foraminal stenosis. C4-5: Small central disc protrusion. There is no spinal canal stenosis. No neural foraminal stenosis. C5-6: Disc space narrowing with uncovertebral hypertrophy bilaterally. Mild spinal canal stenosis. Mild bilateral neural foraminal stenosis. C6-7: Small disc bulge with uncovertebral hypertrophy. There is no spinal canal stenosis. Mild bilateral neural foraminal stenosis. C7-T1: Normal disc space and  facet joints. There is no spinal canal stenosis. No neural foraminal stenosis. IMPRESSION: 1. Mild C5-6 spinal canal stenosis. 2. Mild bilateral neural foraminal stenosis at C5-6 and C6-7. 3. Unchanged mild left C3-4 neural foraminal stenosis. Electronically Signed   By: Ulyses Jarred M.D.   On: 01/18/2021 22:17    ____________________________________________  PROCEDURES   Procedure(s) performed (including Critical Care):  Procedures  ____________________________________________  INITIAL IMPRESSION / MDM / [place] / ED COURSE  As part of my medical decision making, I reviewed the following data within the [facility] notes reviewed and incorporated, Old chart reviewed, Notes from prior ED visits, and [place] Controlled Substance Database       *Brooke Jenkins was evaluated in Emergency Department on 01/18/2021 for the symptoms described in the history of present illness. She was evaluated in the context of the global COVID-19 pandemic, which necessitated consideration that the patient might be at risk for infection with the SARS-CoV-2 virus that causes COVID-19. Institutional protocols and algorithms that pertain to the evaluation of patients at risk for COVID-19 are in a state of rapid change based on information released by regulatory bodies including the CDC and federal and state organizations. These policies and algorithms were followed during the patient's care in the ED.  Some ED evaluations and interventions may be delayed as a result of limited staffing during the pandemic.*     Medical Decision Making: 65 year old female here with right hand weakness.  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  Given her age, MRI obtained, reviewed, and shows no evidence of acute cortical stroke.  MRI of the cervical spine does show some cervical canal stenosis  which could be contributing, though no evidence of acute injury or edema.  Lab work is overall very reassuring.  No normal white count.  CMP unremarkable.  Given absence of signs of central etiology, will place her in a splint for comfort and neutral positioning, give a trial course of steroids for possible inflammatory neuropathy, and refer to neurology as an outpatient.  ____________________________________________  FINAL CLINICAL IMPRESSION(S) / ED DIAGNOSES  Final diagnoses:  Right wrist drop     MEDICATIONS GIVEN DURING THIS VISIT:  Medications  sodium chloride flush (NS) 0.9 % injection 3 mL (has no administration in time range)  predniSONE (DELTASONE) tablet 40 mg (has no administration in time range)  ED Discharge Orders          Ordered    predniSONE (DELTASONE) 20 MG tablet  Daily        01/18/21 2235             Note:  This document was prepared using Dragon voice recognition software and may include unintentional dictation errors.   Duffy Bruce, MD 01/18/21 2329

## 2021-01-18 NOTE — ED Triage Notes (Addendum)
Pt comes with c/o right hand numbness. Pt states she got up Sunday morning to open a bottle of water and was unable to. Pt states she hasn't been able to use her hand. Pt states she can feel it but it is just limp.  Pt states unsure if she has had a stroke. Pt denies any blurry vision or dizziness. Pt states headache but that is everyday thing.  Pt states she took a klonopin and clodine at home before coming here.

## 2021-01-26 ENCOUNTER — Ambulatory Visit: Payer: Medicare HMO | Admitting: Gastroenterology

## 2021-03-02 DIAGNOSIS — R7303 Prediabetes: Secondary | ICD-10-CM | POA: Insufficient documentation

## 2021-03-17 ENCOUNTER — Other Ambulatory Visit: Payer: Self-pay

## 2021-03-22 ENCOUNTER — Ambulatory Visit: Payer: Medicare HMO | Admitting: Gastroenterology

## 2021-10-25 ENCOUNTER — Other Ambulatory Visit: Payer: Self-pay

## 2021-10-25 ENCOUNTER — Emergency Department
Admission: EM | Admit: 2021-10-25 | Discharge: 2021-10-25 | Disposition: A | Discharge status: Home / Self Care | Payer: Medicare HMO | Attending: Emergency Medicine | Admitting: Emergency Medicine

## 2021-10-25 ENCOUNTER — Emergency Department: Payer: Medicare HMO

## 2021-10-25 ENCOUNTER — Encounter: Payer: Self-pay | Admitting: Emergency Medicine

## 2021-10-25 DIAGNOSIS — N200 Calculus of kidney: Secondary | ICD-10-CM

## 2021-10-25 DIAGNOSIS — R109 Unspecified abdominal pain: Secondary | ICD-10-CM

## 2021-10-25 DIAGNOSIS — N202 Calculus of kidney with calculus of ureter: Secondary | ICD-10-CM | POA: Insufficient documentation

## 2021-10-25 LAB — BASIC METABOLIC PANEL
Anion gap: 8 (ref 5–15)
BUN: 11 mg/dL (ref 8–23)
CO2: 28 mmol/L (ref 22–32)
Calcium: 9.5 mg/dL (ref 8.9–10.3)
Chloride: 103 mmol/L (ref 98–111)
Creatinine, Ser: 0.85 mg/dL (ref 0.44–1.00)
GFR, Estimated: >60 mL/min (ref >60)
Glucose, Bld: 169 mg/dL — ABNORMAL HIGH (ref 70–99)
Potassium: 5.3 mmol/L — ABNORMAL HIGH (ref 3.5–5.1)
Sodium: 139 mmol/L (ref 135–145)

## 2021-10-25 LAB — URINALYSIS, ROUTINE W REFLEX MICROSCOPIC
Bacteria, UA: NONE SEEN
Bilirubin Urine: NEGATIVE
Glucose, UA: NEGATIVE mg/dL
Ketones, ur: NEGATIVE mg/dL
Leukocytes,Ua: NEGATIVE
Nitrite: NEGATIVE
Protein, ur: NEGATIVE mg/dL
Specific Gravity, Urine: 1.001 — ABNORMAL LOW (ref 1.005–1.030)
Squamous Epithelial / HPF: NONE SEEN (ref 0–5)
WBC, UA: NONE SEEN WBC/hpf (ref 0–5)
pH: 7 (ref 5.0–8.0)

## 2021-10-25 LAB — CBC
HCT: 48.5 % — ABNORMAL HIGH (ref 36.0–46.0)
Hemoglobin: 15.3 g/dL — ABNORMAL HIGH (ref 12.0–15.0)
MCH: 31.9 pg (ref 26.0–34.0)
MCHC: 31.5 g/dL (ref 30.0–36.0)
MCV: 101.3 fL — ABNORMAL HIGH (ref 80.0–100.0)
Platelets: 290 10*3/uL (ref 150–400)
RBC: 4.79 MIL/uL (ref 3.87–5.11)
RDW: 12.3 % (ref 11.5–15.5)
WBC: 8.7 10*3/uL (ref 4.0–10.5)
nRBC: 0 % (ref 0.0–0.2)

## 2021-10-25 MED ORDER — OXYCODONE-ACETAMINOPHEN 5-325 MG PO TABS: 1.0000 | ORAL_TABLET | Freq: Four times a day (QID) | ORAL | 0 refills | Status: DC | PRN

## 2021-10-25 MED ORDER — OXYCODONE-ACETAMINOPHEN 5-325 MG PO TABS
1.0000 | ORAL_TABLET | Freq: Once | ORAL | Status: AC
  Administered 2021-10-25: 1 via ORAL
  Filled 2021-10-25: qty 1

## 2021-10-25 MED ORDER — TAMSULOSIN HCL 0.4 MG PO CAPS: 0.4000 mg | ORAL_CAPSULE | Freq: Every day | ORAL | 0 refills | Status: AC

## 2021-10-25 MED ORDER — KETOROLAC TROMETHAMINE 30 MG/ML IJ SOLN
30.0000 mg | Freq: Once | INTRAMUSCULAR | Status: AC
  Administered 2021-10-25: 30 mg via INTRAVENOUS
  Filled 2021-10-25: qty 1

## 2021-10-25 MED ORDER — ONDANSETRON 4 MG PO TBDP: 4.0000 mg | ORAL_TABLET | Freq: Three times a day (TID) | ORAL | 0 refills | Status: DC | PRN

## 2021-10-25 MED ORDER — ONDANSETRON HCL 4 MG/2ML IJ SOLN
4.0000 mg | Freq: Once | INTRAMUSCULAR | Status: AC
  Administered 2021-10-25: 4 mg via INTRAVENOUS
  Filled 2021-10-25: qty 2

## 2021-10-25 NOTE — ED Provider Notes (Addendum)
? ? ?[facility] ?Emergency Department Provider Note ? ? ? ? Event Date/Time  ? First MD Initiated Contact with Patient 10/25/21 1540   ?  (approximate) ? ? ?History  ? ?Flank Pain ? ? ?HPI ? ?Brooke Jenkins is a 66 y.o. female with a history of kidney stones, presents with sudden onset of left flank pain. She denies hematuria, retention, or fevers. She notes nausea without vomiting.  ? ? ?Physical Exam  ? ?Triage Vital Signs: ?ED Triage Vitals  ?Enc Vitals Group  ?   BP 10/25/21 1345 (!) 150/79  ?   Pulse Rate 10/25/21 1345 (!) 105  ?   Resp 10/25/21 1345 20  ?   Temp 10/25/21 1345 97.8 ?F (36.6 ?C)  ?   Temp Source 10/25/21 1345 Oral  ?   SpO2 10/25/21 1345 100 %  ?   Weight 10/25/21 1346 113 lb 5.1 oz (51.4 kg)  ?   Height 10/25/21 1346 '5\' 4"'$  (1.626 m)  ?   Head Circumference --   ?   Peak Flow --   ?   Pain Score 10/25/21 1345 10  ?   Pain Loc --   ?   Pain Edu? --   ?   Excl. in [place]? --   ? ? ?Most recent vital signs: ?Vitals:  ? 10/25/21 1345 10/25/21 1626  ?BP: (!) 150/79 (!) 148/70  ?Pulse: (!) 105 90  ?Resp: 20 18  ?Temp: 97.8 ?F (36.6 ?C)   ?SpO2: 100% 100%  ? ? ?General Awake, no distress.  ?CV:  Good peripheral perfusion.  ?RESP:  Normal effort.  ?ABD:  No distention. Soft, nontender. Mild left flank tenderness ? ?ED Results / Procedures / Treatments  ? ?Labs ?(all labs ordered are listed, but only abnormal results are displayed) ?Labs Reviewed  ?URINALYSIS, ROUTINE W REFLEX MICROSCOPIC - Abnormal; Notable for the following components:  ?    Result Value  ? Color, Urine COLORLESS (*)   ? APPearance CLEAR (*)   ? Specific Gravity, Urine 1.001 (*)   ? Hgb urine dipstick MODERATE (*)   ? All other components within normal limits  ?BASIC METABOLIC PANEL - Abnormal; Notable for the following components:  ? Potassium 5.3 (*)   ? Glucose, Bld 169 (*)   ? All other components within normal limits  ?CBC - Abnormal; Notable for the following components:  ? Hemoglobin 15.3 (*)   ? HCT 48.5  (*)   ? MCV 101.3 (*)   ? All other components within normal limits  ? ? ? ?EKG ? ? ?RADIOLOGY ? ?I personally viewed and evaluated these images as part of my medical decision making, as well as reviewing the written report by the radiologist. ? ?ED Provider Interpretation: nephrolithiasis  noted} ? ?CT Renal Stone Study ? ?Result Date: 10/25/2021 ?CLINICAL DATA:  Left flank pain.  History of kidney stones. EXAM: CT ABDOMEN AND PELVIS WITHOUT CONTRAST TECHNIQUE: Multidetector CT imaging of the abdomen and pelvis was performed following the standard protocol without IV contrast. RADIATION DOSE REDUCTION: This exam was performed according to the departmental dose-optimization program which includes automated exposure control, adjustment of the mA and/or kV according to patient size and/or use of iterative reconstruction technique. COMPARISON:  CT abdomen and pelvis 02/12/2020 FINDINGS: Lower chest: Clear lung bases. Hepatobiliary: No focal liver abnormality is seen. Status post cholecystectomy. No biliary dilatation. Pancreas: Unremarkable. Spleen: Unremarkable. Adrenals/Urinary Tract: Unremarkable adrenal glands. Bilateral renal calculi measuring up to 3  mm in size with similar overall stone burden compared to the prior study. No hydronephrosis. 4 mm stone in the distal left ureter without significant ureteral dilatation (series 2, image 62). Additional nearby calcifications most compatible with phleboliths. Unremarkable bladder. Stomach/Bowel: The stomach is unremarkable. There is no evidence of bowel obstruction or inflammation. The appendix is unremarkable. Vascular/Lymphatic: Normal caliber of the abdominal aorta. No enlarged lymph nodes. Reproductive: Uterus and bilateral adnexa are unremarkable. Other: No ascites or pneumoperitoneum. Musculoskeletal: No suspicious osseous lesion. Advanced lower lumbar facet arthrosis. IMPRESSION: 1. 4 mm distal left ureteral stone without hydronephrosis. 2. Numerous small  bilateral nonobstructive renal calculi. Electronically Signed   By: Logan Bores M.D.   On: 10/25/2021 15:03   ? ? ?PROCEDURES: ? ?Critical Care performed: No ? ?Procedures ? ? ?MEDICATIONS ORDERED IN ED: ?Medications  ?oxyCODONE-acetaminophen (PERCOCET/ROXICET) 5-325 MG per tablet 1 tablet (1 tablet Oral Given 10/25/21 1351)  ?ondansetron [facility]) injection 4 mg (4 mg Intravenous Given 10/25/21 1359)  ?ketorolac (TORADOL) 30 MG/ML injection 30 mg (30 mg Intravenous Given 10/25/21 1621)  ? ? ? ?IMPRESSION / MDM / ASSESSMENT AND PLAN / ED COURSE  ?I reviewed the triage vital signs and the nursing notes. ?             ?               ? ?Differential diagnosis includes, but is not limited to, acute appendicitis, diverticulitis, urinary tract infection/pyelonephritis, endometriosis, bowel obstruction, colitis, renal colic, gastroenteritis, hernia, fibroids, endometriosis, pregnancy related pain including ectopic pregnancy, etc. ? ? ?Patient's diagnosis is consistent with nephrolithiasis, as confirmed on CT reviewed by me. No other lab abnormalities noted. No leukocytosis or electrolyte abnormality noted. No UA evidence of leukocytosis. Patient will be discharged home with prescriptions for oxycodone, Tamsulosin, and ondansetron. Patient is to follow up with Urology as needed or otherwise directed. Patient is given ED precautions to return to the ED for any worsening or new symptoms. ? ? ?FINAL CLINICAL IMPRESSION(S) / ED DIAGNOSES  ? ?Final diagnoses:  ?Left flank pain  ?Nephrolithiasis  ? ? ? ?Rx / DC Orders  ? ?ED Discharge Orders   ? ?      Ordered  ?  oxyCODONE-acetaminophen (PERCOCET) 5-325 MG tablet  Every 6 hours PRN       ? 10/25/21 1559  ?  tamsulosin (FLOMAX) 0.4 MG CAPS capsule  Daily       ? 10/25/21 1559  ?  ondansetron (ZOFRAN-ODT) 4 MG disintegrating tablet  Every 8 hours PRN       ? 10/25/21 1559  ? ?  ?  ? ?  ? ? ? ?Note:  This document was prepared using Dragon voice recognition software and may include  unintentional dictation errors. ? ?  ?Melvenia Needles, PA-C ?10/25/21 1959 ? ?  ?Shalawn Wynder, Dannielle Karvonen, PA-C ?10/25/21 2001 ? ?  ?Lavonia Drafts, MD ?10/25/21 2056 ? ?

## 2021-10-25 NOTE — Discharge Instructions (Signed)
Take the prescription meds as directed. Follow-up with your primary provider and Urology for further management. Return to the ED if needed.  ?

## 2021-10-25 NOTE — ED Triage Notes (Signed)
Pt here with left side pain. Pt has several kidney stones in both kidneys. Pt c/o severe pain.  ?

## 2021-10-26 ENCOUNTER — Ambulatory Visit (INDEPENDENT_AMBULATORY_CARE_PROVIDER_SITE_OTHER): Payer: Medicare HMO | Admitting: Urology

## 2021-10-26 ENCOUNTER — Other Ambulatory Visit: Payer: Self-pay

## 2021-10-26 VITALS — BP 135/87 | HR 115 | Temp 98.2°F | Ht 60.0 in | Wt 109.0 lb

## 2021-10-26 DIAGNOSIS — N200 Calculus of kidney: Secondary | ICD-10-CM | POA: Diagnosis not present

## 2021-10-26 DIAGNOSIS — N201 Calculus of ureter: Secondary | ICD-10-CM

## 2021-10-26 LAB — URINALYSIS, COMPLETE
Bilirubin, UA: NEGATIVE
Glucose, UA: NEGATIVE
Ketones, UA: NEGATIVE
Nitrite, UA: NEGATIVE
Protein,UA: NEGATIVE
RBC, UA: NEGATIVE
Specific Gravity, UA: <1.005 — ABNORMAL LOW (ref 1.005–1.030)
Urobilinogen, Ur: 0.2 mg/dL (ref 0.2–1.0)
pH, UA: 6 (ref 5.0–7.5)

## 2021-10-26 LAB — MICROSCOPIC EXAMINATION: Bacteria, UA: NONE SEEN

## 2021-10-26 MED ORDER — SODIUM CHLORIDE 0.9 % IV SOLN: INTRAVENOUS | Status: DC

## 2021-10-26 MED ORDER — CEPHALEXIN 500 MG PO CAPS: 500.0000 mg | ORAL_CAPSULE | Freq: Once | ORAL | Status: DC

## 2021-10-26 MED ORDER — DIAZEPAM 5 MG PO TABS: 10.0000 mg | ORAL_TABLET | ORAL | Status: DC

## 2021-10-26 MED ORDER — ONDANSETRON HCL 4 MG/2ML IJ SOLN: 4.0000 mg | Freq: Once | INTRAMUSCULAR | Status: DC

## 2021-10-26 MED ORDER — DIPHENHYDRAMINE HCL 25 MG PO CAPS: 25.0000 mg | ORAL_CAPSULE | ORAL | Status: DC

## 2021-10-26 NOTE — Progress Notes (Signed)
? ?10/26/21 ?11:05 AM  ? ?Brooke Jenkins ?04-20-1956 ?[id number] ? ?Referring provider:  ?Barbaraann Boys, MD ?[address] [place] ?[place],  [place] [address] ?Chief Complaint  ?Patient presents with  ? Nephrolithiasis  ? ? ? ? ?HPI: ?Brooke Jenkins is a 66 y.o.female with a personal history of urinary frequency who presents today for further evaluation of left ureteral stone.  ? ?She also has a personal history of kidney stones.  ? ?She was last seen in clinic by me in 2020 for further evaluation of urinary frequency. Her Myrbetriq was increased at that time to 50 mg. ? ?She was seen in the ED on 10/25/2021 with sudden onset left flank pain accompanied by nausea. Urinalysis showed moderate Hgb but was otherwise unremarkable. Her creatinine and BUN was WNL. CT renal stone study visualized bilateral renal calculi measuring up to 3 mm in size with similar overall stone burden compared to the prior study. No hydronephrosis. A 4 mm stone in the distal left ureter without significant ureteral dilation. Additional nearby calcifications most compatible with phleboliths. She was discharged on Flomax.  ? ?She reports today that her pain started three weeks ago. It was similar pain to previous stone events. She reports that she has never had interval passage of stone on her own she required stone procedures.  ? ?PMH: ?Past Medical History:  ?Diagnosis Date  ? Anxiety   ? Arthritis   ? Cataracts, bilateral   ? Chronic hepatitis C ([place]) 04/25/2010  ? Depression   ? History of hepatitis   ? Insomnia   ? Kidney stones   ? Tinnitus   ? ? ?Surgical History: ?Past Surgical History:  ?Procedure Laterality Date  ? CHOLECYSTECTOMY    ? COLONOSCOPY WITH PROPOFOL N/A 05/19/2019  ? Procedure: COLONOSCOPY WITH PROPOFOL;  Surgeon: Lin Landsman, MD;  Location: [facility] ENDOSCOPY;  Service: Gastroenterology;  Laterality: N/A;  ? ESOPHAGOGASTRODUODENOSCOPY (EGD) WITH PROPOFOL N/A 05/19/2019  ? Procedure: ESOPHAGOGASTRODUODENOSCOPY (EGD) WITH  PROPOFOL;  Surgeon: Lin Landsman, MD;  Location: [facility] ENDOSCOPY;  Service: Gastroenterology;  Laterality: N/A;  ? HEMORROIDECTOMY    ? KIDNEY STONE SURGERY    ? TEMPOROMANDIBULAR JOINT SURGERY    ? ? ?Home Medications:  ?Allergies as of 10/26/2021   ? ?   Reactions  ? Mirabegron Shortness Of Breath, Other (See Comments), Cough  ? Amoxicillin-pot Clavulanate Nausea And Vomiting  ? Aspirin Other (See Comments)  ? Abdominal Pain  ? Ciprofloxacin Nausea And Vomiting  ? Tramadol Nausea And Vomiting  ? Vicodin [hydrocodone-acetaminophen] Itching  ? ?  ? ?  ?Medication List  ?  ? ?  ? Accurate as of October 26, 2021 11:05 AM. If you have any questions, ask your nurse or doctor.  ?  ?  ? ?  ? ?STOP taking these medications   ? ?DULoxetine 60 MG capsule ?Commonly known as: CYMBALTA ?  ?linaclotide 72 MCG capsule ?Commonly known as: Linzess ?  ?ondansetron 4 MG tablet ?Commonly known as: ZOFRAN ?  ?pregabalin 75 MG capsule ?Commonly known as: LYRICA ?  ?Trintellix 5 MG Tabs tablet ?Generic drug: vortioxetine HBr ?  ?Viibryd 10 MG Tabs ?Generic drug: Vilazodone HCl ?  ? ?  ? ?TAKE these medications   ? ?acyclovir 400 MG tablet ?Commonly known as: ZOVIRAX ?TAKE 1 TABLET BY MOUTH 3 TIMES PER DAY FOR 5 DAYS AT FIRST SIGN OF OUTBREAK. DO NOT TAKE WITH ZANAFLEX ?  ?butalbital-acetaminophen-caffeine 50-325-40 MG tablet ?Commonly known as: FIORICET ?Take 1 tablet  by mouth every 6 (six) hours as needed. ?  ?cetirizine 10 MG tablet ?Commonly known as: ZYRTEC ?Take by mouth. ?  ?clonazePAM 1 MG tablet ?Commonly known as: KLONOPIN ?  ?cloNIDine 0.2 MG tablet ?Commonly known as: CATAPRES ?Take by mouth. ?  ?cyclobenzaprine 5 MG tablet ?Commonly known as: FLEXERIL ?Take 1 tablet by mouth 3 (three) times daily as needed. ?  ?DayVigo 10 MG Tabs ?Generic drug: Lemborexant ?Take 1 tablet by mouth at bedtime. ?  ?lurasidone 20 MG Tabs tablet ?Commonly known as: LATUDA ?SMARTSIG:1 Tablet(s) By Mouth Every Evening ?  ?methylphenidate 20 MG  tablet ?Commonly known as: RITALIN ?Take by mouth. ?  ?nystatin 100000 UNIT/ML suspension ?Commonly known as: MYCOSTATIN ?Take 5 mLs by mouth 4 (four) times daily. ?  ?ondansetron 4 MG disintegrating tablet ?Commonly known as: ZOFRAN-ODT ?Take 1 tablet (4 mg total) by mouth every 8 (eight) hours as needed for nausea or vomiting. ?  ?oxyCODONE-acetaminophen 5-325 MG tablet ?Commonly known as: Percocet ?Take 1 tablet by mouth every 6 (six) hours as needed for severe pain. ?  ?tamsulosin 0.4 MG Caps capsule ?Commonly known as: FLOMAX ?Take 1 capsule (0.4 mg total) by mouth daily for 10 days. ?  ? ?  ? ? ?Allergies:  ?Allergies  ?Allergen Reactions  ? Mirabegron Shortness Of Breath, Other (See Comments) and Cough  ? Amoxicillin-Pot Clavulanate Nausea And Vomiting  ? Aspirin Other (See Comments)  ?  Abdominal Pain  ? Ciprofloxacin Nausea And Vomiting  ? Tramadol Nausea And Vomiting  ? Vicodin [Hydrocodone-Acetaminophen] Itching  ? ? ?Family History: ?No family history on file. ? ?Social History:  reports that she has been smoking cigarettes. She has a 10.00 pack-year smoking history. She has never used smokeless tobacco. She reports that she does not currently use drugs. She reports that she does not drink alcohol. ? ? ?Physical Exam: ?BP 135/87   Pulse (!) 115   Temp 98.2 ?F (36.8 ?C)   Ht 5' (1.524 m)   Wt 109 lb (49.4 kg)   BMI 21.29 kg/m?   ?Constitutional:  Alert and oriented, No acute distress. ?HEENT: [place] AT, moist mucus membranes.  Trachea midline, no masses. ?Cardiovascular: No clubbing, cyanosis, or edema. ?Respiratory: Normal respiratory effort, no increased work of breathing. ?Skin: No rashes, bruises or suspicious lesions. ?Neurologic: Grossly intact, no focal deficits, moving all 4 extremities. ?Psychiatric: Normal mood and affect. ? ?Laboratory Data: ? ?Lab Results  ?Component Value Date  ? CREATININE 0.85 10/25/2021  ? ?Urinalysis  ?Results for orders placed or performed in visit on 10/26/21   ?Microscopic Examination  ? Urine  ?Result Value Ref Range  ? WBC, UA 6-10 (A) 0 - 5 /hpf  ? RBC 3-10 (A) 0 - 2 /hpf  ? Epithelial Cells (non renal) 0-10 0 - 10 /hpf  ? Bacteria, UA None seen None seen/Few  ?Urinalysis, Complete  ?Result Value Ref Range  ? Specific Gravity, UA <1.005 (L) 1.005 - 1.030  ? pH, UA 6.0 5.0 - 7.5  ? Color, UA Yellow Yellow  ? Appearance Ur Clear Clear  ? Leukocytes,UA 1+ (A) Negative  ? Protein,UA Negative Negative/Trace  ? Glucose, UA Negative Negative  ? Ketones, UA Negative Negative  ? RBC, UA Negative Negative  ? Bilirubin, UA Negative Negative  ? Urobilinogen, Ur 0.2 0.2 - 1.0 mg/dL  ? Nitrite, UA Negative Negative  ? Microscopic Examination See below:   ? ? ?Pertinent Imaging: ?CLINICAL DATA:  Left flank pain.  History of kidney  stones. ?  ?EXAM: ?CT ABDOMEN AND PELVIS WITHOUT CONTRAST ?  ?TECHNIQUE: ?Multidetector CT imaging of the abdomen and pelvis was performed ?following the standard protocol without IV contrast. ?  ?RADIATION DOSE REDUCTION: This exam was performed according to the ?departmental dose-optimization program which includes automated ?exposure control, adjustment of the mA and/or kV according to ?patient size and/or use of iterative reconstruction technique. ?  ?COMPARISON:  CT abdomen and pelvis 02/12/2020 ?  ?FINDINGS: ?Lower chest: Clear lung bases. ?  ?Hepatobiliary: No focal liver abnormality is seen. Status post ?cholecystectomy. No biliary dilatation. ?  ?Pancreas: Unremarkable. ?  ?Spleen: Unremarkable. ?  ?Adrenals/Urinary Tract: Unremarkable adrenal glands. Bilateral renal ?calculi measuring up to 3 mm in size with similar overall stone ?burden compared to the prior study. No hydronephrosis. 4 mm stone in ?the distal left ureter without significant ureteral dilatation ?(series 2, image 62). Additional nearby calcifications most ?compatible with phleboliths. Unremarkable bladder. ?  ?Stomach/Bowel: The stomach is unremarkable. There is no evidence  of ?bowel obstruction or inflammation. The appendix is unremarkable. ?  ?Vascular/Lymphatic: Normal caliber of the abdominal aorta. No ?enlarged lymph nodes. ?  ?Reproductive: Uterus and bilateral adnexa are unremarkable. ?  ?Oth

## 2021-10-26 NOTE — Progress Notes (Signed)
[facility] ORDER FORM ? ?Expected date of procedure: 10/27/2021 ? ?Surgeon: [name], MD ? ?Post op standing: 2-4wk follow up w/KUB prior ? ?Anticoagulation/Aspirin/NSAID standing order: Hold all 72 hours prior ? ?Anesthesia standing order: MAC ? ?VTE standing: SCD's ? ?Dx: Left Ureteral Stone ? ?Procedure: left Extracorporeal shock wave lithotripsy ? ?CPT : [phone] ? ?Standing Order Set:  ? ?*NPO after mn, KUB ? ?*NS 152m/hr, Keflex 5026mPO, Benadryl 2528mO, Valium 60m72m, Zofran 4mg 24m? ? ? ?Medications if other than standing orders:   NONE ? ? ? ?

## 2021-10-27 ENCOUNTER — Ambulatory Visit
Admission: RE | Admit: 2021-10-27 | Discharge: 2021-10-27 | Disposition: A | Discharge status: Home / Self Care | Payer: Medicare HMO | Attending: Urology | Admitting: Urology

## 2021-10-27 ENCOUNTER — Encounter
Admission: RE | Disposition: A | Discharge status: Home / Self Care | Payer: Self-pay | Source: Home / Self Care | Attending: Urology

## 2021-10-27 ENCOUNTER — Encounter: Payer: Self-pay | Admitting: Certified Registered"

## 2021-10-27 DIAGNOSIS — N201 Calculus of ureter: Secondary | ICD-10-CM | POA: Diagnosis present

## 2021-10-27 DIAGNOSIS — Z5309 Procedure and treatment not carried out because of other contraindication: Secondary | ICD-10-CM | POA: Diagnosis not present

## 2021-10-27 HISTORY — PX: EXTRACORPOREAL SHOCK WAVE LITHOTRIPSY: SHX1557

## 2021-10-27 SURGERY — LITHOTRIPSY, ESWL
Anesthesia: Moderate Sedation | Laterality: Left

## 2021-10-27 NOTE — Progress Notes (Signed)
Patient ate a donut and drank coffee at 1000. She did not want to wait until 1630 for her procedure so she left the hospital.  ?

## 2021-10-29 LAB — CULTURE, URINE COMPREHENSIVE

## 2021-10-31 ENCOUNTER — Encounter: Payer: Self-pay | Admitting: Urology

## 2021-11-03 ENCOUNTER — Other Ambulatory Visit: Payer: Self-pay | Admitting: Urology

## 2021-11-03 ENCOUNTER — Encounter: Payer: Self-pay | Admitting: Urology

## 2021-11-03 ENCOUNTER — Ambulatory Visit
Admission: RE | Admit: 2021-11-03 | Discharge: 2021-11-03 | Disposition: A | Discharge status: Home / Self Care | Payer: Medicare HMO | Attending: Urology | Admitting: Urology

## 2021-11-03 ENCOUNTER — Encounter
Admission: RE | Disposition: A | Discharge status: Home / Self Care | Payer: Self-pay | Source: Home / Self Care | Attending: Urology

## 2021-11-03 ENCOUNTER — Other Ambulatory Visit: Payer: Self-pay

## 2021-11-03 ENCOUNTER — Ambulatory Visit: Payer: Medicare HMO

## 2021-11-03 DIAGNOSIS — N201 Calculus of ureter: Secondary | ICD-10-CM | POA: Diagnosis present

## 2021-11-03 HISTORY — PX: EXTRACORPOREAL SHOCK WAVE LITHOTRIPSY: SHX1557

## 2021-11-03 SURGERY — LITHOTRIPSY, ESWL
Anesthesia: Moderate Sedation | Laterality: Left

## 2021-11-03 MED ORDER — OXYCODONE-ACETAMINOPHEN 5-325 MG PO TABS: 1.0000 | ORAL_TABLET | Freq: Four times a day (QID) | ORAL | 0 refills | Status: AC | PRN

## 2021-11-03 MED ORDER — DIPHENHYDRAMINE HCL 25 MG PO CAPS
ORAL_CAPSULE | ORAL | Status: AC
  Administered 2021-11-03: 25 mg via ORAL
  Filled 2021-11-03: qty 1

## 2021-11-03 MED ORDER — ONDANSETRON HCL 4 MG/2ML IJ SOLN
INTRAMUSCULAR | Status: AC
  Administered 2021-11-03: 4 mg via INTRAVENOUS
  Filled 2021-11-03: qty 2

## 2021-11-03 MED ORDER — DIPHENHYDRAMINE HCL 25 MG PO CAPS: 25.0000 mg | ORAL_CAPSULE | ORAL | Status: AC

## 2021-11-03 MED ORDER — CEPHALEXIN 500 MG PO CAPS: 500.0000 mg | ORAL_CAPSULE | Freq: Once | ORAL | Status: AC

## 2021-11-03 MED ORDER — SODIUM CHLORIDE 0.9 % IV SOLN: INTRAVENOUS | Status: DC

## 2021-11-03 MED ORDER — ONDANSETRON HCL 4 MG PO TABS
ORAL_TABLET | ORAL | Status: AC
  Filled 2021-11-03: qty 1

## 2021-11-03 MED ORDER — CEPHALEXIN 500 MG PO CAPS
ORAL_CAPSULE | ORAL | Status: AC
  Administered 2021-11-03: 500 mg via ORAL
  Filled 2021-11-03: qty 1

## 2021-11-03 MED ORDER — DIAZEPAM 5 MG PO TABS
ORAL_TABLET | ORAL | Status: AC
  Administered 2021-11-03: 10 mg via ORAL
  Filled 2021-11-03: qty 2

## 2021-11-03 MED ORDER — ONDANSETRON HCL 4 MG/2ML IJ SOLN: 4.0000 mg | Freq: Once | INTRAMUSCULAR | Status: AC

## 2021-11-03 MED ORDER — DIAZEPAM 5 MG PO TABS: 10.0000 mg | ORAL_TABLET | ORAL | Status: AC

## 2021-11-03 NOTE — Progress Notes (Signed)
Brooke Jenkins was scheduled for SWL of a left distal ureteral calculus today.  When she presented to preop she informed nursing her left-sided pain had resolved however was complaining of similar pain on the right side in the pelvic area. ? ?KUB reviewed and her left distal ureteral calculus was no longer present.  There were phleboliths bilaterally in the true bony pelvis when compared with prior KUB.  Bilateral renal calcifications.  Definite stone not seen in the right ureter though there is a large amount of stool and bowel gas present. ? ?She was informed she may have a small right ureteral calculus.  Recommend she restart tamsulosin which she states that she has on hand.  Rx oxycodone was sent to pharmacy.  She will contact office for persistent symptoms. ?

## 2021-11-04 ENCOUNTER — Encounter: Payer: Self-pay | Admitting: Urology

## 2021-11-21 ENCOUNTER — Encounter: Payer: Self-pay | Admitting: Urology

## 2021-11-22 ENCOUNTER — Ambulatory Visit: Payer: Medicare HMO | Admitting: Physician Assistant

## 2022-01-23 ENCOUNTER — Other Ambulatory Visit: Payer: Self-pay | Admitting: Physical Medicine and Rehabilitation

## 2022-01-23 DIAGNOSIS — M5416 Radiculopathy, lumbar region: Secondary | ICD-10-CM

## 2022-02-03 ENCOUNTER — Other Ambulatory Visit: Payer: Self-pay | Admitting: Otolaryngology

## 2022-02-03 DIAGNOSIS — H9313 Tinnitus, bilateral: Secondary | ICD-10-CM

## 2022-02-14 ENCOUNTER — Ambulatory Visit
Admission: RE | Admit: 2022-02-14 | Discharge: 2022-02-14 | Disposition: A | Discharge status: Home / Self Care | Payer: Medicare HMO | Source: Ambulatory Visit | Attending: Otolaryngology | Admitting: Otolaryngology

## 2022-02-14 ENCOUNTER — Ambulatory Visit
Admission: RE | Admit: 2022-02-14 | Discharge: 2022-02-14 | Disposition: A | Discharge status: Home / Self Care | Payer: Medicare HMO | Source: Ambulatory Visit | Attending: Physical Medicine and Rehabilitation | Admitting: Physical Medicine and Rehabilitation

## 2022-02-14 DIAGNOSIS — M5416 Radiculopathy, lumbar region: Secondary | ICD-10-CM

## 2022-02-14 DIAGNOSIS — H9313 Tinnitus, bilateral: Secondary | ICD-10-CM

## 2022-02-14 MED ORDER — GADOBENATE DIMEGLUMINE 529 MG/ML IV SOLN
10.0000 mL | Freq: Once | INTRAVENOUS | Status: AC | PRN
  Administered 2022-02-14: 10 mL via INTRAVENOUS

## 2023-01-02 DIAGNOSIS — R194 Change in bowel habit: Secondary | ICD-10-CM

## 2023-01-02 DIAGNOSIS — M6289 Other specified disorders of muscle: Secondary | ICD-10-CM | POA: Insufficient documentation

## 2023-01-02 DIAGNOSIS — K921 Melena: Secondary | ICD-10-CM | POA: Insufficient documentation

## 2023-01-02 DIAGNOSIS — D126 Benign neoplasm of colon, unspecified: Secondary | ICD-10-CM | POA: Insufficient documentation

## 2023-01-02 DIAGNOSIS — K644 Residual hemorrhoidal skin tags: Secondary | ICD-10-CM | POA: Insufficient documentation

## 2023-01-02 DIAGNOSIS — K601 Chronic anal fissure: Secondary | ICD-10-CM | POA: Insufficient documentation

## 2023-01-02 HISTORY — DX: Change in bowel habit: R19.4

## 2023-02-19 ENCOUNTER — Ambulatory Visit: Payer: Self-pay

## 2023-02-19 DIAGNOSIS — K635 Polyp of colon: Secondary | ICD-10-CM | POA: Diagnosis not present

## 2023-02-19 DIAGNOSIS — K641 Second degree hemorrhoids: Secondary | ICD-10-CM | POA: Diagnosis not present

## 2023-02-19 DIAGNOSIS — K5730 Diverticulosis of large intestine without perforation or abscess without bleeding: Secondary | ICD-10-CM | POA: Diagnosis not present

## 2023-02-19 DIAGNOSIS — D122 Benign neoplasm of ascending colon: Secondary | ICD-10-CM | POA: Diagnosis not present

## 2023-03-28 ENCOUNTER — Ambulatory Visit: Payer: Medicare HMO | Attending: Obstetrics and Gynecology | Admitting: Physical Therapy

## 2023-03-28 ENCOUNTER — Encounter: Payer: Self-pay | Admitting: Physical Therapy

## 2023-03-28 DIAGNOSIS — M25571 Pain in right ankle and joints of right foot: Secondary | ICD-10-CM | POA: Insufficient documentation

## 2023-03-28 DIAGNOSIS — R279 Unspecified lack of coordination: Secondary | ICD-10-CM | POA: Diagnosis present

## 2023-03-28 DIAGNOSIS — M25562 Pain in left knee: Secondary | ICD-10-CM | POA: Diagnosis present

## 2023-03-28 DIAGNOSIS — M25561 Pain in right knee: Secondary | ICD-10-CM | POA: Insufficient documentation

## 2023-03-28 DIAGNOSIS — M5459 Other low back pain: Secondary | ICD-10-CM | POA: Insufficient documentation

## 2023-03-28 DIAGNOSIS — M533 Sacrococcygeal disorders, not elsewhere classified: Secondary | ICD-10-CM | POA: Diagnosis present

## 2023-03-28 DIAGNOSIS — G8929 Other chronic pain: Secondary | ICD-10-CM | POA: Diagnosis present

## 2023-03-28 NOTE — Patient Instructions (Signed)
Minisquat: Scoot buttocks back slight, hinge like you are looking at your reflection on a pond  Knees behind toes,  Inhale to "smell flowers"  Exhale on the rise "like rocket"  Do not lock knees, have more weight across ballmounds of feet, toes relaxed and spread them, not grip them   10 reps x 3 x day   __  Bending with minisQUAT WHEN BENDING AND SCOOPING UP CAT LITTER    STANDING 45 DEG TO THE LITTER BOX TO GET TO THE BACK OF IT  MINI SQUAT  l FOOT FORWARD, FOOT BACK    __  SITTING WITH FEET ON THE GROUND

## 2023-03-28 NOTE — Therapy (Signed)
OUTPATIENT PHYSICAL THERAPY EVALUATION   Patient Name: MELVIE HANDAL MRN: [id number] DOB:1955-11-19, 67 y.o., female Today's Date: 03/28/2023   PT End of Session - 03/28/23 1337     Visit Number 1    Number of Visits 10    Date for PT Re-Evaluation 06/06/23    PT Start Time 1333    PT Stop Time 1415    PT Time Calculation (min) 42 min    Activity Tolerance Patient tolerated treatment well;No increased pain    Behavior During Therapy [facility] for tasks assessed/performed             Past Medical History:  Diagnosis Date   Anxiety    Arthritis    Cataracts, bilateral    Chronic hepatitis C (HCC) 04/25/2010   Depression    History of hepatitis    Insomnia    Kidney stones    Tinnitus    Past Surgical History:  Procedure Laterality Date   CHOLECYSTECTOMY     COLONOSCOPY WITH PROPOFOL N/A 05/19/2019   Procedure: COLONOSCOPY WITH PROPOFOL;  Surgeon: Toney Reil, MD;  Location: [facility] ENDOSCOPY;  Service: Gastroenterology;  Laterality: N/A;   ESOPHAGOGASTRODUODENOSCOPY (EGD) WITH PROPOFOL N/A 05/19/2019   Procedure: ESOPHAGOGASTRODUODENOSCOPY (EGD) WITH PROPOFOL;  Surgeon: Toney Reil, MD;  Location: [facility] ENDOSCOPY;  Service: Gastroenterology;  Laterality: N/A;   EXTRACORPOREAL SHOCK WAVE LITHOTRIPSY Left 11/03/2021   Procedure: EXTRACORPOREAL SHOCK WAVE LITHOTRIPSY (ESWL);  Surgeon: Riki Altes, MD;  Location: [facility] ORS;  Service: Urology;  Laterality: Left;   EXTRACORPOREAL SHOCK WAVE LITHOTRIPSY Left 10/27/2021   Procedure: EXTRACORPOREAL SHOCK WAVE LITHOTRIPSY (ESWL);  Surgeon: Vanna Scotland, MD;  Location: [facility] ORS;  Service: Urology;  Laterality: Left;   HEMORROIDECTOMY     KIDNEY STONE SURGERY     TEMPOROMANDIBULAR JOINT SURGERY     Patient Active Problem List   Diagnosis Date Noted   Migraine without aura and without status migrainosus, not intractable 08/28/2019   Nausea    Hx of colonic polyps    Tobacco use disorder 11/05/2018    Post-menopausal atrophic vaginitis 10/18/2018   Seasonal allergic rhinitis due to pollen 11/13/2017   Chondromalacia of knee 11/06/2017   Nephrolithiasis 03/28/2016   DDD (degenerative disc disease), cervical 01/12/2016   Tinnitus, bilateral 01/12/2016   ADD (attention deficit disorder) 12/10/2015   Chronic idiopathic constipation 12/07/2015   Depression, major, recurrent, in partial remission (HCC) 12/07/2015   GERD (gastroesophageal reflux disease) 12/07/2015   Nicotine dependence, uncomplicated 12/07/2015   Chronic insomnia 12/07/2015   Anxiety disorder 12/26/2013   Menopausal hot flushes 05/08/2013   Postmenopausal HRT (hormone replacement therapy) 05/08/2013   Osteoarthritis, hand 02/12/2013   Acute upper respiratory infection 03/03/2012   Acute midline low back pain without sciatica 04/11/2011   Neck pain 04/11/2011   Complete rupture of rotator cuff 10/06/2010   Depressive disorder 04/25/2010   PCP: Richardine Service   REFERRING PROVIDER: Arn Medal  REFERRING DIAG: Frequent urination   Rationale for Evaluation and Treatment Rehabilitation  THERAPY DIAG:  Sacrococcygeal disorders, not elsewhere classified  Unspecified lack of coordination  Other low back pain  Pain in right ankle and joints of right foot  Chronic pain of both knees  ONSET DATE:   SUBJECTIVE:  SUBJECTIVE STATEMENT  ON EVAL 03/28/23 1) urinary urge without leakage and incomplete emptying urine and bowel  : started 6 months ago. She is not able to relax to go to the store and not rush back home because she feels she has to urinate. The urge is painful like her bladder is full and she has to get catherized. Pain is 10/10 when bladder is full   Pt has to use her finger to eliminate stools because she feels the stool gets stuck   2)  CLBP:  "feels like someone has hit me in the back with a bat". Pt gets injections from Dr. Yves Dill.  At worst 10/10 with bending, using the dust buster,  scoop cat litter box   3) B knee pain: going up and down stairs ( pt lives on 3rd floor of apt).  10/10,  Pain is on the outside corner of knee cap . Coming down stairs, pt goes step to, up stairs, : step to step   4) R foot pain:  Pt dropped a pepper mill on R foot.2 month ago.  Pt is due to see podiatrist 04/11/23 . Hurts with walking   PERTINENT HISTORY:  ADD, major depressive anxiety disorder and sees a psychiatrist only. Enjoys TV all the time   PAIN:  Are you having pain? Yes: See above  PRECAUTIONS: None  WEIGHT BEARING RESTRICTIONS: No   FALLS:  Has patient fallen in last 6 months?Yes, in apartment 3 x when getting off her loveseat , fell on her knees and had a bruise, no injuries . Pt has been taking medications that impact her balance  LIVING ENVIRONMENT: Lives with: alone   Lives in: apartment, 3rd floor, walks 32 steps with rail  Stairs: No  OCCUPATION: on disability,   PLOF: IND   PATIENT GOALS: Relaxation, be able to relax to go to the store and not rush back home because she feels she has to urinate    OBJECTIVE:   [facility] PT Assessment - 03/28/23 1413       AROM   Overall AROM Comments Sideflexion L and R with LBP, no pain Rotation      Strength   Overall Strength Comments RLE 3+/5, L 4-/5      Palpation   SI assessment  L shoulder, R iliac crest lowered      Ambulation/Gait   Gait Comments 0.76 m/s, excessive sway of L pelvis, decerased stance on R LE,             [facility] Adult PT Treatment/Exercise - 03/28/23 1413       Therapeutic Activites    Therapeutic Activities Other Therapeutic Activities    Other Therapeutic Activities explained POC, cocreated goals, explained alignment first and then pelvic floor focus      Neuro Re-ed    Neuro Re-ed Details  cued forbody mechanics                HOME EXERCISE PROGRAM: See pt instruction section    ASSESSMENT:  CLINICAL IMPRESSION:    Pt is a  67  yo  who presents with  urinary urge without leakage, incomplete emptying urine and bowel  CLBP,  B knee pain, R foot pain   which impact QOL, ADL,  and community activities.   Pt's musculoskeletal assessment revealed uneven pelvic girdle and shoulder height, asymmetries to gait pattern, limited spinal /pelvic mobility, dyscoordination and strength of pelvic floor mm, hip weakness, poor body mechanics which places strain on the  abdominal/pelvic floor mm. These are deficits that indicate an ineffective intraabdominal pressure system associated with increased risk for pt's Sx.   Pt was provided education on etiology of Sx with anatomy, physiology explanation with images along with the benefits of customized pelvic PT Tx based on pt's medical conditions and musculoskeletal deficits.  Explained the physiology of deep core mm coordination and roles of pelvic floor function in urination, defecation, sexual function, and postural control with deep core mm system.   Regional interdependent approaches will yield greater benefits in pt's POC.   Following Tx today which pt tolerated without complaints,  pt demo'd proper body mechanics to minimize straining pelvic floor/ back with scooping cat litter and bending, sitting posture.  Plan to address realignment of spine/ pelvis at next session to help promote balance/ minimize falls and optimize IAP system for improved pelvic floor function.  Plan to address pelvic floor issues once pelvis and spine are realigned to yield better outcomes.   Pt benefits from skilled PT.    OBJECTIVE IMPAIRMENTS decreased activity tolerance, decreased coordination, decreased endurance, decreased mobility, difficulty walking, decreased ROM, decreased strength, decreased safety awareness, hypomobility, increased muscle spasms, impaired flexibility, improper body  mechanics, postural dysfunction, and pain. scar restrictions   ACTIVITY LIMITATIONS  self-care,  sleep, home chores, work tasks    PARTICIPATION LIMITATIONS:  community, gym activities    PERSONAL FACTORS        are also affecting patient's functional outcome.    REHAB POTENTIAL: Good   CLINICAL DECISION MAKING: Evolving/moderate complexity   EVALUATION COMPLEXITY: Moderate    PATIENT EDUCATION:    Education details: Showed pt anatomy images. Explained muscles attachments/ connection, physiology of deep core system/ spinal- thoracic-pelvis-lower kinetic chain as they relate to pt's presentation, Sx, and past Hx. Explained what and how these areas of deficits need to be restored to balance and function    See Therapeutic activity / neuromuscular re-education section  Answered pt's questions.   Person educated: Patient Education method: Explanation, Demonstration, Tactile cues, Verbal cues, and Handouts Education comprehension: verbalized understanding, returned demonstration, verbal cues required, tactile cues required, and needs further education     PLAN: PT FREQUENCY: 1x/week   PT DURATION: 10 weeks   PLANNED INTERVENTIONS: Therapeutic exercises, Therapeutic activity, Neuromuscular re-education, Balance training, Gait training, Patient/Family education, Self Care, Joint mobilization, Spinal mobilization, Moist heat, Taping, and Manual therapy, dry needling.   PLAN FOR NEXT SESSION: See clinical impression for plan     GOALS: Goals reviewed with patient? Yes  SHORT TERM GOALS: Target date: 04/25/2023    Pt will demo IND with HEP                    Baseline: Not IND            Goal status: INITIAL   LONG TERM GOALS: Target date: 06/06/2023   1.Pt will demo proper deep core coordination without chest breathing and optimal excursion of diaphragm/pelvic floor in order to promote spinal stability and pelvic floor function  Baseline: dyscoordination Goal status:  INITIAL  2.  Pt will demo > 5 pt change on FOTO  to improve QOL and function  PFDI Urinary baseline -  Lower score = better function  Urinary Problem baseline-   Higher score = better function  Pelvic Pain baseline - Lower score = better function  Bowel  constipation baseline -   Higher score = better function  PFDI Bowel - Higher score = better function  Lumber baseline  - 42  Higher score = better function   Goal status: INITIAL  3.  Pt will demo proper body mechanics in against gravity tasks and ADLs  work tasks, fitness  to minimize straining pelvic floor / back    Baseline: not IND, improper form that places strain on pelvic floor  Goal status: INITIAL    4. Pt will demo increased gait speed > 1.3 m/s with reciprocal gait pattern, longer stride length  in order to ambulate safely in community and return to fitness routine  Baseline:  Goal status: INITIAL    5. Pt will report able to relax to go to the store and not rush back home because she feels she has to urinate  Baseline:not relax to go to the store and not rush back home because she feels she has to urinate  Goal status: INITIAL   6. Pt will demi proper squat / bend mechanics with dust buster and scoop cat litter and report decreased pain from 10/10 to 5/10 LBP pain with these tasks  Baseline: bending, using the dust buster , scoop cat litter box with 10/10 pain  Goal status: INITIAL   7.  Pt will demo safe technique for stairs to navigate to her apt without decreased knee pain < 5/10 and mobility at [facility] . Baseline: going up and down stairs ( pt lives on 3rd floor of apt).  10/10, Goal Status: INITIAL    8. Pt will report no longer needing to use finger to completely empty her bowel movements  Baseline: Pt has to use her finger to eliminate stools because she feels the stool gets stuck  Goal:  Initial   Mariane Masters, PT 03/28/2023, 1:49 PM

## 2023-03-29 ENCOUNTER — Encounter: Payer: Self-pay | Admitting: Emergency Medicine

## 2023-03-29 ENCOUNTER — Other Ambulatory Visit: Payer: Self-pay

## 2023-03-29 ENCOUNTER — Emergency Department
Admission: EM | Admit: 2023-03-29 | Discharge: 2023-03-29 | Disposition: A | Discharge status: Home / Self Care | Payer: Medicare HMO | Attending: Student in an Organized Health Care Education/Training Program | Admitting: Student in an Organized Health Care Education/Training Program

## 2023-03-29 DIAGNOSIS — T63441A Toxic effect of venom of bees, accidental (unintentional), initial encounter: Secondary | ICD-10-CM | POA: Diagnosis not present

## 2023-03-29 DIAGNOSIS — T782XXA Anaphylactic shock, unspecified, initial encounter: Secondary | ICD-10-CM

## 2023-03-29 DIAGNOSIS — T63444A Toxic effect of venom of bees, undetermined, initial encounter: Secondary | ICD-10-CM

## 2023-03-29 DIAGNOSIS — R42 Dizziness and giddiness: Secondary | ICD-10-CM | POA: Diagnosis present

## 2023-03-29 MED ORDER — DIPHENHYDRAMINE HCL 25 MG PO CAPS
25.0000 mg | ORAL_CAPSULE | Freq: Once | ORAL | Status: AC
Start: 2023-03-29 — End: 2023-03-29
  Administered 2023-03-29: 25 mg via ORAL
  Filled 2023-03-29: qty 1

## 2023-03-29 MED ORDER — EPINEPHRINE 0.3 MG/0.3ML IJ SOAJ
0.3000 mg | Freq: Once | INTRAMUSCULAR | Status: AC
Start: 2023-03-29 — End: 2023-03-29
  Administered 2023-03-29: 0.3 mg via INTRAMUSCULAR
  Filled 2023-03-29: qty 0.3

## 2023-03-29 NOTE — ED Provider Notes (Signed)
[facility] Provider Note    Event Date/Time   First MD Initiated Contact with Patient 03/29/23 1229     (approximate)   History   Insect Bite   HPI  Brooke Jenkins is a 67 y.o. female who presents to the ER for evaluation of tightness and scratchy throat that occurred after she was feeling by shortly prior to arrival.  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  Does have an EpiPen but elected to drive herself to the ER for evaluation.  No nausea or vomiting.  Does feel lightheaded.  Denies any chest pain or cardiac history.     Physical Exam   Triage Vital Signs: ED Triage Vitals  Encounter Vitals Group     BP 03/29/23 1154 (!) 145/84     Systolic BP Percentile --      Diastolic BP Percentile --      Pulse Rate 03/29/23 1154 84     Resp 03/29/23 1154 16     Temp 03/29/23 1154 97.7 F (36.5 C)     Temp Source 03/29/23 1154 Oral     SpO2 03/29/23 1154 97 %     Weight 03/29/23 1155 110 lb (49.9 kg)     Height 03/29/23 1155 5' (1.524 m)     Head Circumference --      Peak Flow --      Pain Score 03/29/23 1155 9     Pain Loc --      Pain Education --      Exclude from Growth Chart --     Most recent vital signs: Vitals:   03/29/23 1154 03/29/23 1247  BP: (!) 145/84 139/70  Pulse: 84 87  Resp: 16 16  Temp: 97.7 F (36.5 C) 98 F (36.7 C)  SpO2: 97% 100%     Constitutional: Alert  Eyes: Conjunctivae are normal.  Head: Atraumatic. Nose: No congestion/rhinnorhea. Mouth/Throat: Mucous membranes are moist.  Uvula midline Neck: Painless ROM.  Cardiovascular:   Good peripheral circulation. Respiratory: Normal respiratory effort.  No retractions.  Gastrointestinal: Soft and nontender.  Musculoskeletal:  no deformity Neurologic:  MAE spontaneously. No gross focal neurologic deficits are appreciated.  Skin:  Skin is warm, dry and intact.  Small insect sting mark on the right dorsal hand no significant edema  or swelling. Psychiatric: Mood and affect are normal. Speech and behavior are normal.    ED Results / Procedures / Treatments   Labs (all labs ordered are listed, but only abnormal results are displayed) Labs Reviewed - No data to display   EKG     RADIOLOGY    PROCEDURES:  Critical Care performed:   Procedures   MEDICATIONS ORDERED IN ED: Medications  EPINEPHrine (EPI-PEN) injection 0.3 mg (0.3 mg Intramuscular Given 03/29/23 1240)  diphenhydrAMINE (BENADRYL) capsule 25 mg (25 mg Oral Given 03/29/23 1239)     IMPRESSION / MDM / ASSESSMENT AND PLAN / ED COURSE  I reviewed the triage vital signs and the nursing notes.                              Differential diagnosis includes, but is not limited to, anaphylaxis, allergic reaction, bee sting  Patient presented to the ER for evaluation of symptoms as described above concerning for allergic reaction after bee sting.  Given airway symptoms and history of anaphylaxis elected to proceed with EpiPen injection and  observation.   Clinical Course as of 03/29/23 1454  Thu Mar 29, 2023  1443 Patient reassessed.  Remains well-appearing in no acute distress.  This point patient does appear stable and appropriate for discharge home. [PR]    Clinical Course User Index [PR] Willy Eddy, MD     FINAL CLINICAL IMPRESSION(S) / ED DIAGNOSES   Final diagnoses:  Bee sting, undetermined intent, initial encounter  Anaphylaxis, initial encounter     Rx / DC Orders   ED Discharge Orders     None        Note:  This document was prepared using Dragon voice recognition software and may include unintentional dictation errors.    Willy Eddy, MD 03/29/23 [phone]

## 2023-03-29 NOTE — ED Triage Notes (Addendum)
Pt states that she was stung by something large, black with some yellow on it, states that she was tongue on her right hand. Pt denies taking any medication prior to arrival, pt estimates approx 30 min prior. Pt states that she is supposed to carry an epi pen but doesn't. Pt has some redness to her right hand, states that her throat feels a little scratchy,and reports that she feels dizzy, pt reports driving herself. No outwards signs of distress noted

## 2023-04-04 ENCOUNTER — Ambulatory Visit: Payer: Medicare HMO | Admitting: Physical Therapy

## 2023-04-11 ENCOUNTER — Ambulatory Visit: Payer: Medicare HMO | Admitting: Physical Therapy

## 2023-04-18 ENCOUNTER — Ambulatory Visit: Payer: Medicare HMO | Admitting: Physical Therapy

## 2023-04-24 ENCOUNTER — Ambulatory Visit: Payer: Medicare HMO | Admitting: Physical Therapy

## 2023-04-25 ENCOUNTER — Encounter: Payer: Medicare HMO | Admitting: Physical Therapy

## 2023-05-02 ENCOUNTER — Ambulatory Visit: Payer: Medicare HMO | Attending: Obstetrics and Gynecology | Admitting: Physical Therapy

## 2023-05-02 DIAGNOSIS — M25562 Pain in left knee: Secondary | ICD-10-CM | POA: Insufficient documentation

## 2023-05-02 DIAGNOSIS — M5459 Other low back pain: Secondary | ICD-10-CM | POA: Insufficient documentation

## 2023-05-02 DIAGNOSIS — G8929 Other chronic pain: Secondary | ICD-10-CM | POA: Diagnosis present

## 2023-05-02 DIAGNOSIS — M25561 Pain in right knee: Secondary | ICD-10-CM | POA: Diagnosis present

## 2023-05-02 DIAGNOSIS — M25571 Pain in right ankle and joints of right foot: Secondary | ICD-10-CM | POA: Diagnosis present

## 2023-05-02 DIAGNOSIS — R279 Unspecified lack of coordination: Secondary | ICD-10-CM | POA: Diagnosis present

## 2023-05-02 DIAGNOSIS — M533 Sacrococcygeal disorders, not elsewhere classified: Secondary | ICD-10-CM | POA: Insufficient documentation

## 2023-05-02 NOTE — Patient Instructions (Addendum)
Minisquat: Scoot buttocks back slight, hinge like you are looking at your reflection on a pond  Knees behind toes,  Inhale to "smell flowers"  Exhale on the rise "like rocket"  Do not lock knees, have more weight across ballmounds of feet, toes relaxed and spread them, not grip them   10 reps x 3 x day    _   Lengthen Back rib by R  shoulder   ( winging)    Lie on L  side , pillow between knees and under head  Pull  R arm overhead over mattress, grab the edge of mattress,pull it upward, drawing elbow away from ears  Breathing 10 reps  Brushing arm with 3/4 turn onto pillow behind back  Lying on L  side ,Pillow/ Block between knees     dragging top forearm across ribs below breast rotating 3/4 turn,  rotating  _R_ only this week ,  relax onto the pillow behind the back  and then back to other palm , maintain top palm on body whole top and not lift shoulder  Do this side this week       Wait do both sides until we have levelled out your spine and shoulders

## 2023-05-02 NOTE — Therapy (Signed)
OUTPATIENT PHYSICAL THERAPY TREATMENT    Patient Name: Brooke Jenkins MRN: [id number] DOB:1955-10-23, 67 y.o., female Today's Date: 05/02/2023   PT End of Session - 05/02/23 1028     Visit Number 2    Number of Visits 10    Date for PT Re-Evaluation 06/06/23    PT Start Time 1020    PT Stop Time 1100    PT Time Calculation (min) 40 min    Activity Tolerance Patient tolerated treatment well;No increased pain    Behavior During Therapy [employer] for tasks assessed/performed             Past Medical History:  Diagnosis Date   Anxiety    Arthritis    Cataracts, bilateral    Chronic hepatitis C (HCC) 04/25/2010   Depression    History of hepatitis    Insomnia    Kidney stones    Tinnitus    Past Surgical History:  Procedure Laterality Date   CHOLECYSTECTOMY     COLONOSCOPY WITH PROPOFOL N/A 05/19/2019   Procedure: COLONOSCOPY WITH PROPOFOL;  Surgeon: Toney Reil, MD;  Location: [facility] ENDOSCOPY;  Service: Gastroenterology;  Laterality: N/A;   ESOPHAGOGASTRODUODENOSCOPY (EGD) WITH PROPOFOL N/A 05/19/2019   Procedure: ESOPHAGOGASTRODUODENOSCOPY (EGD) WITH PROPOFOL;  Surgeon: Toney Reil, MD;  Location: [facility] ENDOSCOPY;  Service: Gastroenterology;  Laterality: N/A;   EXTRACORPOREAL SHOCK WAVE LITHOTRIPSY Left 11/03/2021   Procedure: EXTRACORPOREAL SHOCK WAVE LITHOTRIPSY (ESWL);  Surgeon: Riki Altes, MD;  Location: [facility] ORS;  Service: Urology;  Laterality: Left;   EXTRACORPOREAL SHOCK WAVE LITHOTRIPSY Left 10/27/2021   Procedure: EXTRACORPOREAL SHOCK WAVE LITHOTRIPSY (ESWL);  Surgeon: Vanna Scotland, MD;  Location: [facility] ORS;  Service: Urology;  Laterality: Left;   HEMORROIDECTOMY     KIDNEY STONE SURGERY     TEMPOROMANDIBULAR JOINT SURGERY     Patient Active Problem List   Diagnosis Date Noted   Migraine without aura and without status migrainosus, not intractable 08/28/2019   Nausea    Hx of colonic polyps    Tobacco use disorder 11/05/2018    Post-menopausal atrophic vaginitis 10/18/2018   Seasonal allergic rhinitis due to pollen 11/13/2017   Chondromalacia of knee 11/06/2017   Nephrolithiasis 03/28/2016   DDD (degenerative disc disease), cervical 01/12/2016   Tinnitus, bilateral 01/12/2016   ADD (attention deficit disorder) 12/10/2015   Chronic idiopathic constipation 12/07/2015   Depression, major, recurrent, in partial remission (HCC) 12/07/2015   GERD (gastroesophageal reflux disease) 12/07/2015   Nicotine dependence, uncomplicated 12/07/2015   Chronic insomnia 12/07/2015   Anxiety disorder 12/26/2013   Menopausal hot flushes 05/08/2013   Postmenopausal HRT (hormone replacement therapy) 05/08/2013   Osteoarthritis, hand 02/12/2013   Acute upper respiratory infection 03/03/2012   Acute midline low back pain without sciatica 04/11/2011   Neck pain 04/11/2011   Complete rupture of rotator cuff 10/06/2010   Depressive disorder 04/25/2010   PCP: Richardine Service   REFERRING PROVIDER: Arn Medal  REFERRING DIAG: Frequent urination   Rationale for Evaluation and Treatment Rehabilitation  THERAPY DIAG:  Sacrococcygeal disorders, not elsewhere classified  Unspecified lack of coordination  Other low back pain  Pain in right ankle and joints of right foot  Chronic pain of both knees  ONSET DATE:   SUBJECTIVE:             SUBJECTIVE STATEMENT  TODAY:   Pt missed appts because she had a insect sting and food poisoning. Pt is not sure if she is bending correctly.  Pt got back injections after her first visit with PT.                                                                                                                                                                              SUBJECTIVE STATEMENT  ON EVAL 03/28/23 1) urinary urge without leakage and incomplete emptying urine and bowel  : started 6 months ago. She is not able to relax to go to the store and not rush back home because she feels she has to urinate.  The urge is painful like her bladder is full and she has to get catherized. Pain is 10/10 when bladder is full   Pt has to use her finger to eliminate stools because she feels the stool gets stuck   2) CLBP:  "feels like someone has hit me in the back with a bat". Pt gets injections from Dr. Yves Dill.  At worst 10/10 with bending, using the dust buster,  scoop cat litter box   3) B knee pain: going up and down stairs ( pt lives on 3rd floor of apt).  10/10,  Pain is on the outside corner of knee cap . Coming down stairs, pt goes step to, up stairs, : step to step   4) R foot pain:  Pt dropped a pepper mill on R foot.2 month ago.  Pt is due to see podiatrist 04/11/23 . Hurts with walking   PERTINENT HISTORY:  ADD, major depressive anxiety disorder and sees a psychiatrist only. Enjoys TV all the time   PAIN:  Are you having pain? Yes: See above  PRECAUTIONS: None  WEIGHT BEARING RESTRICTIONS: No   FALLS:  Has patient fallen in last 6 months?Yes, in apartment 3 x when getting off her loveseat , fell on her knees and had a bruise, no injuries . Pt has been taking medications that impact her balance  LIVING ENVIRONMENT: Lives with: alone   Lives in: apartment, 3rd floor, walks 32 steps with rail  Stairs: No  OCCUPATION: on disability,   PLOF: IND   PATIENT GOALS: Relaxation, be able to relax to go to the store and not rush back home because she feels she has to urinate    OBJECTIVE:    [facility] PT Assessment - 05/02/23 1030       Other:   Other/ Comments bending: minimial knee bending      AROM   Overall AROM Comments Sideflexion L / R pulls on R side      PROM   Overall PROM Comments '      Palpation   Spinal mobility tightness along R thoracic paraspinals. medial scapula mm  R    SI  assessment  R shoulder/ iliac crest slightly higher             [facility] Adult PT Treatment/Exercise - 05/02/23 1503       Neuro Re-ed    Neuro Re-ed Details  Cued for thoracic mobility  HEP, excessive cues for proper bending, mini squat      Modalities   Modalities Moist Heat      Moist Heat Therapy   Number Minutes Moist Heat 5 Minutes    Moist Heat Location --   under thorcic area ( unbilled) guided relaxation     Manual Therapy   Manual therapy comments STM/MWM at problem areas noted in assessment to realign spine                HOME EXERCISE PROGRAM: See pt instruction section    ASSESSMENT:  CLINICAL IMPRESSION:   Today, focused on realignment of spine/ pelvis at next session to help promote balance/ minimize falls and optimize IAP system for improved pelvic floor function.   Manual Tx was modified to minimize tenderness and to promote realignment of thoracic spine. Pt demo'd levelled shoulders post Tx. Cued for thoracic mobility HEP, excessive cues for proper bending, mini squat.   Plan to address pelvic floor issues once pelvis and spine are realigned to yield better outcomes.   Pt benefits from skilled PT.    OBJECTIVE IMPAIRMENTS decreased activity tolerance, decreased coordination, decreased endurance, decreased mobility, difficulty walking, decreased ROM, decreased strength, decreased safety awareness, hypomobility, increased muscle spasms, impaired flexibility, improper body mechanics, postural dysfunction, and pain. scar restrictions   ACTIVITY LIMITATIONS  self-care,  sleep, home chores, work tasks    PARTICIPATION LIMITATIONS:  community, gym activities    PERSONAL FACTORS        are also affecting patient's functional outcome.    REHAB POTENTIAL: Good   CLINICAL DECISION MAKING: Evolving/moderate complexity   EVALUATION COMPLEXITY: Moderate    PATIENT EDUCATION:    Education details: Showed pt anatomy images. Explained muscles attachments/ connection, physiology of deep core system/ spinal- thoracic-pelvis-lower kinetic chain as they relate to pt's presentation, Sx, and past Hx. Explained what and how these areas of deficits  need to be restored to balance and function    See Therapeutic activity / neuromuscular re-education section  Answered pt's questions.   Person educated: Patient Education method: Explanation, Demonstration, Tactile cues, Verbal cues, and Handouts Education comprehension: verbalized understanding, returned demonstration, verbal cues required, tactile cues required, and needs further education     PLAN: PT FREQUENCY: 1x/week   PT DURATION: 10 weeks   PLANNED INTERVENTIONS: Therapeutic exercises, Therapeutic activity, Neuromuscular re-education, Balance training, Gait training, Patient/Family education, Self Care, Joint mobilization, Spinal mobilization, Moist heat, Taping, and Manual therapy, dry needling.   PLAN FOR NEXT SESSION: See clinical impression for plan     GOALS: Goals reviewed with patient? Yes  SHORT TERM GOALS: Target date: 04/25/2023    Pt will demo IND with HEP                    Baseline: Not IND            Goal status: INITIAL   LONG TERM GOALS: Target date: 06/06/2023   1.Pt will demo proper deep core coordination without chest breathing and optimal excursion of diaphragm/pelvic floor in order to promote spinal stability and pelvic floor function  Baseline: dyscoordination Goal status: INITIAL  2.  Pt will demo > 5 pt change on FOTO  to improve QOL and function  PFDI Urinary baseline -  Lower score = better function  Urinary Problem baseline-   Higher score = better function  Pelvic Pain baseline - Lower score = better function  Bowel  constipation baseline -   Higher score = better function  PFDI Bowel - Higher score = better function  Lumber baseline  - 42  Higher score = better function   Goal status: INITIAL  3.  Pt will demo proper body mechanics in against gravity tasks and ADLs  work tasks, fitness  to minimize straining pelvic floor / back    Baseline: not IND, improper form that places strain on pelvic floor  Goal status:  INITIAL    4. Pt will demo increased gait speed > 1.3 m/s with reciprocal gait pattern, longer stride length  in order to ambulate safely in community and return to fitness routine  Baseline:  Goal status: INITIAL    5. Pt will report able to relax to go to the store and not rush back home because she feels she has to urinate  Baseline:not relax to go to the store and not rush back home because she feels she has to urinate  Goal status: INITIAL   6. Pt will demi proper squat / bend mechanics with dust buster and scoop cat litter and report decreased pain from 10/10 to 5/10 LBP pain with these tasks  Baseline: bending, using the dust buster , scoop cat litter box with 10/10 pain  Goal status: INITIAL   7.  Pt will demo safe technique for stairs to navigate to her apt without decreased knee pain < 5/10 and mobility at [facility] . Baseline: going up and down stairs ( pt lives on 3rd floor of apt).  10/10, Goal Status: INITIAL    8. Pt will report no longer needing to use finger to completely empty her bowel movements  Baseline: Pt has to use her finger to eliminate stools because she feels the stool gets stuck  Goal:  Initial   Mariane Masters, PT 05/02/2023, 10:29 AM

## 2023-05-09 ENCOUNTER — Ambulatory Visit: Payer: Medicare HMO | Admitting: Physical Therapy

## 2023-05-16 ENCOUNTER — Ambulatory Visit: Payer: Medicare HMO | Admitting: Physical Therapy

## 2023-05-16 DIAGNOSIS — G8929 Other chronic pain: Secondary | ICD-10-CM

## 2023-05-16 DIAGNOSIS — M533 Sacrococcygeal disorders, not elsewhere classified: Secondary | ICD-10-CM | POA: Diagnosis not present

## 2023-05-16 DIAGNOSIS — R279 Unspecified lack of coordination: Secondary | ICD-10-CM

## 2023-05-16 DIAGNOSIS — M5459 Other low back pain: Secondary | ICD-10-CM

## 2023-05-16 DIAGNOSIS — M25571 Pain in right ankle and joints of right foot: Secondary | ICD-10-CM

## 2023-05-16 NOTE — Therapy (Signed)
OUTPATIENT PHYSICAL THERAPY TREATMENT    Patient Name: Brooke Jenkins MRN: [id number] DOB:02/01/1956, 67 y.o., female Today's Date: 05/16/2023   PT End of Session - 05/16/23 1031     Visit Number 3    Number of Visits 10    Date for PT Re-Evaluation 06/06/23    PT Start Time 1025    PT Stop Time 1105    PT Time Calculation (min) 40 min    Activity Tolerance Patient tolerated treatment well;No increased pain    Behavior During Therapy [facility] for tasks assessed/performed             Past Medical History:  Diagnosis Date   Anxiety    Arthritis    Cataracts, bilateral    Chronic hepatitis C (HCC) 04/25/2010   Depression    History of hepatitis    Insomnia    Kidney stones    Tinnitus    Past Surgical History:  Procedure Laterality Date   CHOLECYSTECTOMY     COLONOSCOPY WITH PROPOFOL N/A 05/19/2019   Procedure: COLONOSCOPY WITH PROPOFOL;  Surgeon: Toney Reil, MD;  Location: [facility] ENDOSCOPY;  Service: Gastroenterology;  Laterality: N/A;   ESOPHAGOGASTRODUODENOSCOPY (EGD) WITH PROPOFOL N/A 05/19/2019   Procedure: ESOPHAGOGASTRODUODENOSCOPY (EGD) WITH PROPOFOL;  Surgeon: Toney Reil, MD;  Location: [facility] ENDOSCOPY;  Service: Gastroenterology;  Laterality: N/A;   EXTRACORPOREAL SHOCK WAVE LITHOTRIPSY Left 11/03/2021   Procedure: EXTRACORPOREAL SHOCK WAVE LITHOTRIPSY (ESWL);  Surgeon: Riki Altes, MD;  Location: [facility] ORS;  Service: Urology;  Laterality: Left;   EXTRACORPOREAL SHOCK WAVE LITHOTRIPSY Left 10/27/2021   Procedure: EXTRACORPOREAL SHOCK WAVE LITHOTRIPSY (ESWL);  Surgeon: Vanna Scotland, MD;  Location: [facility] ORS;  Service: Urology;  Laterality: Left;   HEMORROIDECTOMY     KIDNEY STONE SURGERY     TEMPOROMANDIBULAR JOINT SURGERY     Patient Active Problem List   Diagnosis Date Noted   Migraine without aura and without status migrainosus, not intractable 08/28/2019   Nausea    Hx of colonic polyps    Tobacco use disorder 11/05/2018    Post-menopausal atrophic vaginitis 10/18/2018   Seasonal allergic rhinitis due to pollen 11/13/2017   Chondromalacia of knee 11/06/2017   Nephrolithiasis 03/28/2016   DDD (degenerative disc disease), cervical 01/12/2016   Tinnitus, bilateral 01/12/2016   ADD (attention deficit disorder) 12/10/2015   Chronic idiopathic constipation 12/07/2015   Depression, major, recurrent, in partial remission (HCC) 12/07/2015   GERD (gastroesophageal reflux disease) 12/07/2015   Nicotine dependence, uncomplicated 12/07/2015   Chronic insomnia 12/07/2015   Anxiety disorder 12/26/2013   Menopausal hot flushes 05/08/2013   Postmenopausal HRT (hormone replacement therapy) 05/08/2013   Osteoarthritis, hand 02/12/2013   Acute upper respiratory infection 03/03/2012   Acute midline low back pain without sciatica 04/11/2011   Neck pain 04/11/2011   Complete rupture of rotator cuff 10/06/2010   Depressive disorder 04/25/2010   PCP: Richardine Service   REFERRING PROVIDER: Arn Medal  REFERRING DIAG: Frequent urination   Rationale for Evaluation and Treatment Rehabilitation  THERAPY DIAG:  Sacrococcygeal disorders, not elsewhere classified  Unspecified lack of coordination  Other low back pain  Pain in right ankle and joints of right foot  Chronic pain of both knees  ONSET DATE:   SUBJECTIVE:             SUBJECTIVE STATEMENT  TODAY:   Pt reports she has been having migraines. Pt feels like she is drunk when walking in her house with her balance.   This has been happening  for the past 2 months. Pt has not reported to MD  Pt's knees hurt with stairs  Pt has cramps in her calves   Pt 's LBP is better by 65% with bending, scooping cat litter.                                                                                                                                                                    SUBJECTIVE STATEMENT  ON EVAL 03/28/23 1) urinary urge without leakage and incomplete emptying urine and  bowel  : started 6 months ago. She is not able to relax to go to the store and not rush back home because she feels she has to urinate. The urge is painful like her bladder is full and she has to get catherized. Pain is 10/10 when bladder is full   Pt has to use her finger to eliminate stools because she feels the stool gets stuck   2) CLBP:  "feels like someone has hit me in the back with a bat". Pt gets injections from Dr. Yves Dill.  At worst 10/10 with bending, using the dust buster,  scoop cat litter box   3) B knee pain: going up and down stairs ( pt lives on 3rd floor of apt).  10/10,  Pain is on the outside corner of knee cap . Coming down stairs, pt goes step to, up stairs, : step to step   4) R foot pain:  Pt dropped a pepper mill on R foot.2 month ago.  Pt is due to see podiatrist 04/11/23 . Hurts with walking   PERTINENT HISTORY:  ADD, major depressive anxiety disorder and sees a psychiatrist only. Enjoys TV all the time   PAIN:  Are you having pain? Yes: See above  PRECAUTIONS: None  WEIGHT BEARING RESTRICTIONS: No   FALLS:  Has patient fallen in last 6 months?Yes, in apartment 3 x when getting off her loveseat , fell on her knees and had a bruise, no injuries . Pt has been taking medications that impact her balance  LIVING ENVIRONMENT: Lives with: alone   Lives in: apartment, 3rd floor, walks 32 steps with rail  Stairs: No  OCCUPATION: on disability,   PLOF: IND   PATIENT GOALS: Relaxation, be able to relax to go to the store and not rush back home because she feels she has to urinate    OBJECTIVE:     [facility] PT Assessment - 05/16/23 1045       Other:   Other/ Comments narrow BOS with mini squat      Strength   Overall Strength Comments 8 reps on L with knee pain at patella, 7 reps on R MMT 4/5 with unilat support      Palpation  SI assessment  levelled pelvis and shoulder    Palpation comment limited mobility at midfoot joints, abduction of toes,  ext  digi/ ant tib L>R      Ambulation/Gait   Stairs --   poor eccentric control on descend, narrow BOS, L knee IR            [facility] Adult PT Treatment/Exercise - 05/16/23 1045       Ambulation/Gait   Gait Comments narrow BOS      Therapeutic Activites    Other Therapeutic Activities assessed stairclimbing/ descension/  cued for more eccentric control on descension with knee alignment , wider BOS,      Neuro Re-ed    Neuro Re-ed Details  cued for knee alignment and eccentric control with stairs, cued for new LKC mobility and alignment HEP to promote more ER of knee to minimize knee pain      Manual Therapy   Manual therapy comments STM/MWM at problem areas noted in assessment to ER knee, tibia, mobilize midfoot joints                HOME EXERCISE PROGRAM: See pt instruction section    ASSESSMENT:  CLINICAL IMPRESSION:  Improvement: Pt 's LBP is better by 65% with bending, scooping cat litter.                                                                                                                                                                      Pt showed good carry over with proper squat technique and good caryr over with aligned pelvis and shoulder alignment which is contributing to her improved LBP.   Addressed knee pain with stairs.   Cued for more eccentric control on descension with knee alignment , wider BOS.  Pt reported less knee pain with cues.   Manual Tx helped to promote ER knee/ tibia and mobility of midfoot joints.                 Plan to apply more manual Tx to promote more LKC mobility and add more neuromuscular reeducation for stability and balance.                                Pt reports she has been having migraines. Pt feels like she is drunk when walking in her house with her balance.   This has been happening for the past 2 months. Pt has not reported to MD. Explained to pt to f/u with MD. Plan to triage at next session to ensure pt's  safety.   Pt benefits from skilled PT.    OBJECTIVE IMPAIRMENTS decreased activity tolerance, decreased coordination, decreased endurance, decreased mobility,  difficulty walking, decreased ROM, decreased strength, decreased safety awareness, hypomobility, increased muscle spasms, impaired flexibility, improper body mechanics, postural dysfunction, and pain. scar restrictions   ACTIVITY LIMITATIONS  self-care,  sleep, home chores, work tasks    PARTICIPATION LIMITATIONS:  community, gym activities    PERSONAL FACTORS        are also affecting patient's functional outcome.    REHAB POTENTIAL: Good   CLINICAL DECISION MAKING: Evolving/moderate complexity   EVALUATION COMPLEXITY: Moderate    PATIENT EDUCATION:    Education details: Showed pt anatomy images. Explained muscles attachments/ connection, physiology of deep core system/ spinal- thoracic-pelvis-lower kinetic chain as they relate to pt's presentation, Sx, and past Hx. Explained what and how these areas of deficits need to be restored to balance and function    See Therapeutic activity / neuromuscular re-education section  Answered pt's questions.   Person educated: Patient Education method: Explanation, Demonstration, Tactile cues, Verbal cues, and Handouts Education comprehension: verbalized understanding, returned demonstration, verbal cues required, tactile cues required, and needs further education     PLAN: PT FREQUENCY: 1x/week   PT DURATION: 10 weeks   PLANNED INTERVENTIONS: Therapeutic exercises, Therapeutic activity, Neuromuscular re-education, Balance training, Gait training, Patient/Family education, Self Care, Joint mobilization, Spinal mobilization, Moist heat, Taping, and Manual therapy, dry needling.   PLAN FOR NEXT SESSION: See clinical impression for plan     GOALS: Goals reviewed with patient? Yes  SHORT TERM GOALS: Target date: 04/25/2023    Pt will demo IND with HEP                     Baseline: Not IND            Goal status: INITIAL   LONG TERM GOALS: Target date: 06/06/2023   1.Pt will demo proper deep core coordination without chest breathing and optimal excursion of diaphragm/pelvic floor in order to promote spinal stability and pelvic floor function  Baseline: dyscoordination Goal status: INITIAL  2.  Pt will demo > 5 pt change on FOTO  to improve QOL and function  PFDI Urinary baseline -  Lower score = better function  Urinary Problem baseline-   Higher score = better function  Pelvic Pain baseline - Lower score = better function  Bowel  constipation baseline -   Higher score = better function  PFDI Bowel - Higher score = better function  Lumber baseline  - 42  Higher score = better function   Goal status: INITIAL  3.  Pt will demo proper body mechanics in against gravity tasks and ADLs  work tasks, fitness  to minimize straining pelvic floor / back    Baseline: not IND, improper form that places strain on pelvic floor  Goal status: INITIAL    4. Pt will demo increased gait speed > 1.3 m/s with reciprocal gait pattern, longer stride length  in order to ambulate safely in community and return to fitness routine  Baseline:  Goal status: INITIAL    5. Pt will report able to relax to go to the store and not rush back home because she feels she has to urinate  Baseline:not relax to go to the store and not rush back home because she feels she has to urinate  Goal status: INITIAL   6. Pt will demi proper squat / bend mechanics with dust buster and scoop cat litter and report decreased pain from 10/10 to 5/10 LBP pain with these tasks  Baseline: bending, using the dust  buster , scoop cat litter box with 10/10 pain  Goal status: INITIAL   7.  Pt will demo safe technique for stairs to navigate to her apt without decreased knee pain < 5/10 and mobility at [facility] . Baseline: going up and down stairs ( pt lives on 3rd floor of apt).  10/10, Goal  Status: INITIAL    8. Pt will report no longer needing to use finger to completely empty her bowel movements  Baseline: Pt has to use her finger to eliminate stools because she feels the stool gets stuck  Goal:  Initial   Mariane Masters, PT 05/16/2023, 10:31 AM

## 2023-05-16 NOTE — Patient Instructions (Signed)
   Feet slides :   Points of contact at sitting bones  Four points of contact of foot,  Side knee back while keeping knee out along 2-3rd toe line   Heel up, ankle not twist out Lower heel while keeping knee out along 2-3rd toe line Four points of contact of foot, Slide foot back while keeping knee out along 2-3rd toe line   Repeated with other foot   2 min   ___    Stairs Wider feet under hips  Track knee align 2-3rd toes line   Lower heel slowly to not load so much on knees

## 2023-05-23 ENCOUNTER — Ambulatory Visit: Payer: Medicare HMO | Admitting: Physical Therapy

## 2023-05-30 ENCOUNTER — Ambulatory Visit: Payer: Medicare HMO | Attending: Obstetrics and Gynecology | Admitting: Physical Therapy

## 2023-05-30 VITALS — BP 132/85 | HR 76

## 2023-05-30 DIAGNOSIS — M25561 Pain in right knee: Secondary | ICD-10-CM | POA: Insufficient documentation

## 2023-05-30 DIAGNOSIS — M533 Sacrococcygeal disorders, not elsewhere classified: Secondary | ICD-10-CM | POA: Diagnosis present

## 2023-05-30 DIAGNOSIS — M5459 Other low back pain: Secondary | ICD-10-CM | POA: Diagnosis present

## 2023-05-30 DIAGNOSIS — M25571 Pain in right ankle and joints of right foot: Secondary | ICD-10-CM | POA: Diagnosis present

## 2023-05-30 DIAGNOSIS — G8929 Other chronic pain: Secondary | ICD-10-CM | POA: Diagnosis present

## 2023-05-30 DIAGNOSIS — R279 Unspecified lack of coordination: Secondary | ICD-10-CM | POA: Diagnosis present

## 2023-05-30 DIAGNOSIS — M25562 Pain in left knee: Secondary | ICD-10-CM | POA: Diagnosis present

## 2023-05-30 NOTE — Therapy (Signed)
OUTPATIENT PHYSICAL THERAPY TREATMENT    Patient Name: Brooke Jenkins MRN: [id number] DOB:[birth year], 67 y.o., female Today's Date: 05/30/2023   PT End of Session - 05/30/23 1022     Visit Number 4    Number of Visits 10    Date for PT Re-Evaluation 06/06/23    PT Start Time 1023    PT Stop Time 1105    PT Time Calculation (min) 42 min    Activity Tolerance Patient tolerated treatment well;No increased pain    Behavior During Therapy [facility] for tasks assessed/performed             Past Medical History:  Diagnosis Date   Anxiety    Arthritis    Cataracts, bilateral    Chronic hepatitis C (HCC) 04/25/2010   Depression    History of hepatitis    Insomnia    Kidney stones    Tinnitus    Past Surgical History:  Procedure Laterality Date   CHOLECYSTECTOMY     COLONOSCOPY WITH PROPOFOL N/A 05/19/2019   Procedure: COLONOSCOPY WITH PROPOFOL;  Surgeon: Toney Reil, MD;  Location: [facility] ENDOSCOPY;  Service: Gastroenterology;  Laterality: N/A;   ESOPHAGOGASTRODUODENOSCOPY (EGD) WITH PROPOFOL N/A 05/19/2019   Procedure: ESOPHAGOGASTRODUODENOSCOPY (EGD) WITH PROPOFOL;  Surgeon: Toney Reil, MD;  Location: [facility] ENDOSCOPY;  Service: Gastroenterology;  Laterality: N/A;   EXTRACORPOREAL SHOCK WAVE LITHOTRIPSY Left 11/03/2021   Procedure: EXTRACORPOREAL SHOCK WAVE LITHOTRIPSY (ESWL);  Surgeon: Riki Altes, MD;  Location: [facility] ORS;  Service: Urology;  Laterality: Left;   EXTRACORPOREAL SHOCK WAVE LITHOTRIPSY Left 10/27/2021   Procedure: EXTRACORPOREAL SHOCK WAVE LITHOTRIPSY (ESWL);  Surgeon: Vanna Scotland, MD;  Location: [facility] ORS;  Service: Urology;  Laterality: Left;   HEMORROIDECTOMY     KIDNEY STONE SURGERY     TEMPOROMANDIBULAR JOINT SURGERY     Patient Active Problem List   Diagnosis Date Noted   Migraine without aura and without status migrainosus, not intractable 08/28/2019   Nausea    Hx of colonic polyps    Tobacco use disorder 11/05/2018    Post-menopausal atrophic vaginitis 10/18/2018   Seasonal allergic rhinitis due to pollen 11/13/2017   Chondromalacia of knee 11/06/2017   Nephrolithiasis 03/28/2016   DDD (degenerative disc disease), cervical 01/12/2016   Tinnitus, bilateral 01/12/2016   ADD (attention deficit disorder) 12/10/2015   Chronic idiopathic constipation 12/07/2015   Depression, major, recurrent, in partial remission (HCC) 12/07/2015   GERD (gastroesophageal reflux disease) 12/07/2015   Nicotine dependence, uncomplicated 12/07/2015   Chronic insomnia 12/07/2015   Anxiety disorder 12/26/2013   Menopausal hot flushes 05/08/2013   Postmenopausal HRT (hormone replacement therapy) 05/08/2013   Osteoarthritis, hand 02/12/2013   Acute upper respiratory infection 03/03/2012   Acute midline low back pain without sciatica 04/11/2011   Neck pain 04/11/2011   Complete rupture of rotator cuff 10/06/2010   Depressive disorder 04/25/2010   PCP: Richardine Service   REFERRING PROVIDER: Arn Medal  REFERRING DIAG: Frequent urination   Rationale for Evaluation and Treatment Rehabilitation  THERAPY DIAG:  Sacrococcygeal disorders, not elsewhere classified  Unspecified lack of coordination  Other low back pain  Chronic pain of both knees  Pain in right ankle and joints of right foot  ONSET DATE:   SUBJECTIVE:             SUBJECTIVE STATEMENT  TODAY:    Pt reports she told her psychiatrist about her dizziness and walking in her house feeling drunk.  She was explained by her MD that her medications  are causing her Sx. One medication was changed. Pt is looking for a new PCP to get follow up. Pt has had achiness like "someone is squeezing her B arms of the forearm and upper arm" when she wakes up in the morning. This has been happening the past 3 weeks to a month. Pt takes two Tylenol which lessens the Sx. Eventually Sx go away after 2 hours.  Denied weakness to one arm with picking up objects.   One week ago, pt noticed the  achiness was going to her  entire chest and "it was scary" and thought about going to the emergency room but she took two Tylenol and the achiness to the chest went away to a certain degree but not completely.  The night before the chest pain occurred, pt had not taken the Gabapentin for one night   The achiness from the arm to the entire chest only occurred once.  She described it "she had been weight lifting or something."  The arms were not as bad yesterday and today.    PT related updates:  Pt has not had any more cramps in her calves this past week.    Pt 's LBP is still better by 65% with bending, scooping cat litter.  Pt's knee pain is better by 90% better because she remembers to take wider stance like she practiced during last week's session.                                                                                                                                                                      SUBJECTIVE STATEMENT  ON EVAL 03/28/23 1) urinary urge without leakage and incomplete emptying urine and bowel  : started 6 months ago. She is not able to relax to go to the store and not rush back home because she feels she has to urinate. The urge is painful like her bladder is full and she has to get catherized. Pain is 10/10 when bladder is full   Pt has to use her finger to eliminate stools because she feels the stool gets stuck   2) CLBP:  "feels like someone has hit me in the back with a bat". Pt gets injections from Dr. Yves Dill.  At worst 10/10 with bending, using the dust buster,  scoop cat litter box   3) B knee pain: going up and down stairs ( pt lives on 3rd floor of apt).  10/10,  Pain is on the outside corner of knee cap . Coming down stairs, pt goes step to, up stairs, : step to step   4) R foot pain:  Pt dropped a pepper mill on R foot.2 month ago.  Pt is due to see podiatrist 04/11/23 . Hurts  with walking   PERTINENT HISTORY:  ADD, major depressive anxiety disorder  and sees a psychiatrist only. Enjoys TV all the time   PAIN:  Are you having pain? Yes: See above  PRECAUTIONS: None  WEIGHT BEARING RESTRICTIONS: No   FALLS:  Has patient fallen in last 6 months?Yes, in apartment 3 x when getting off her loveseat , fell on her knees and had a bruise, no injuries . Pt has been taking medications that impact her balance  LIVING ENVIRONMENT: Lives with: alone   Lives in: apartment, 3rd floor, walks 32 steps with rail  Stairs: No  OCCUPATION: on disability,   PLOF: IND   PATIENT GOALS: Relaxation, be able to relax to go to the store and not rush back home because she feels she has to urinate    OBJECTIVE:   [facility] PT Assessment - 05/30/23 1637       Squat   Comments good caryr over with squat technique and minisquat      Other:   Other/ Comments good carry over with wider BOS with gait             [facility] PT Assessment - 05/30/23 1637       Squat   Comments good caryr over with squat technique and minisquat      Other:   Other/ Comments good carry over with wider BOS with gait              [facility] PT Assessment - 05/30/23 1637       Squat   Comments good carry over with squat technique and minisquat      Other:   Other/ Comments good carry over with wider BOS with gait            Vitals:   05/30/23 1032 05/30/23 1040  BP: 137/83 132/85  Pulse: 85 76     [facility] Adult PT Treatment/Exercise - 05/30/23 1634       Therapeutic Activites    Other Therapeutic Activities active listening to pt's medical updates re: arm achiness / episode of chest pain . monitored BP twice during session. Advised pt to go to the ER but pt refused. Advised pt to f/u with PCP but pt was seeking a new PCP. Provided pt a list of PCP and called PCP offices to help pt get scheduled as a new patient.  Pt has an appt 11/21 with Grayling Congress, FNP . Educated pt on sign for heart attack for women and to seek immediate medical attention with these Sx.  Advised pt to consult medical provider when she wants to not take a medicati  Pt voiced understanding.               HOME EXERCISE PROGRAM: See pt instruction section    ASSESSMENT:  CLINICAL IMPRESSION:   PT related updates:  Pt has not had any more cramps in her calves this past week.    Pt 's LBP is still better by 65% with bending, scooping cat litter.  Pt's knee pain is better by 90% better because she remembers to take wider stance like she practiced during last week's session.     Interdisciplinary updates:  Subjective:  Pt reports she told her psychiatrist about her dizziness and walking in her house feeling drunk.  She was explained by her MD that her medications are causing her Sx. One medication was changed. Pt is looking for a new PCP to get follow up. Pt has had  achiness like "someone is squeezing her B arms of the forearm and upper arm" when she wakes up in the morning. This has been happening the past 3 weeks to a month. Pt takes two Tylenol which lessens the Sx. Eventually Sx go away after 2 hours.  Denied weakness to one arm with picking up objects. One week ago, pt noticed the achiness was going to her  entire chest and "it was scary" and thought about going to the emergency room but she took two Tylenol and the achiness to the chest went away to a certain degree but not completely.  The night before the chest pain occurred, pt had not taken the Gabapentin for one night. The achiness from the arm to the entire chest only occurred once.  She described it "she had been weight lifting or something."  The arms were not as bad yesterday and today.               Treatment:  Provided active listening to pt's medical updates re: arm achiness / episode of chest pain . Monitored BP twice during session. Advised pt to go to the ER but pt refused as pt's . Advised pt to f/u with PCP but pt was seeking a new PCP. Provided pt a list of PCP and called PCP offices to help pt get  scheduled as a new patient. Pt has an appt 11/21 with Grayling Congress, FNP at [facility] . Educated pt on sign for heart attack for women and to seek immediate medical attention if these Sx occur. Advised pt to consult medical provider when she wants to not take a medication.  Pt voiced understanding                Plan to f/u with pt's medical Sx and modify POC as appropriate.  Pt benefits from skilled PT.    OBJECTIVE IMPAIRMENTS decreased activity tolerance, decreased coordination, decreased endurance, decreased mobility, difficulty walking, decreased ROM, decreased strength, decreased safety awareness, hypomobility, increased muscle spasms, impaired flexibility, improper body mechanics, postural dysfunction, and pain. scar restrictions   ACTIVITY LIMITATIONS  self-care,  sleep, home chores, work tasks    PARTICIPATION LIMITATIONS:  community, gym activities    PERSONAL FACTORS        are also affecting patient's functional outcome.    REHAB POTENTIAL: Good   CLINICAL DECISION MAKING: Evolving/moderate complexity   EVALUATION COMPLEXITY: Moderate    PATIENT EDUCATION:    Education details: Showed pt anatomy images. Explained muscles attachments/ connection, physiology of deep core system/ spinal- thoracic-pelvis-lower kinetic chain as they relate to pt's presentation, Sx, and past Hx. Explained what and how these areas of deficits need to be restored to balance and function    See Therapeutic activity / neuromuscular re-education section  Answered pt's questions.   Person educated: Patient Education method: Explanation, Demonstration, Tactile cues, Verbal cues, and Handouts Education comprehension: verbalized understanding, returned demonstration, verbal cues required, tactile cues required, and needs further education     PLAN: PT FREQUENCY: 1x/week   PT DURATION: 10 weeks   PLANNED INTERVENTIONS: Therapeutic exercises, Therapeutic activity, Neuromuscular  re-education, Balance training, Gait training, Patient/Family education, Self Care, Joint mobilization, Spinal mobilization, Moist heat, Taping, and Manual therapy, dry needling.   PLAN FOR NEXT SESSION: See clinical impression for plan     GOALS: Goals reviewed with patient? Yes  SHORT TERM GOALS: Target date: 04/25/2023    Pt will demo IND with HEP  Baseline: Not IND            Goal status: INITIAL   LONG TERM GOALS: Target date: 06/06/2023   1.Pt will demo proper deep core coordination without chest breathing and optimal excursion of diaphragm/pelvic floor in order to promote spinal stability and pelvic floor function  Baseline: dyscoordination Goal status: INITIAL  2.  Pt will demo > 5 pt change on FOTO  to improve QOL and function  PFDI Urinary baseline -  Lower score = better function  Urinary Problem baseline-   Higher score = better function  Pelvic Pain baseline - Lower score = better function  Bowel  constipation baseline -   Higher score = better function  PFDI Bowel - Higher score = better function  Lumber baseline  - 42  Higher score = better function   Goal status: INITIAL  3.  Pt will demo proper body mechanics in against gravity tasks and ADLs  work tasks, fitness  to minimize straining pelvic floor / back    Baseline: not IND, improper form that places strain on pelvic floor  Goal status: INITIAL    4. Pt will demo increased gait speed > 1.3 m/s with reciprocal gait pattern, longer stride length  in order to ambulate safely in community and return to fitness routine  Baseline:  Goal status: INITIAL    5. Pt will report able to relax to go to the store and not rush back home because she feels she has to urinate  Baseline:not relax to go to the store and not rush back home because she feels she has to urinate  Goal status: INITIAL   6. Pt will demi proper squat / bend mechanics with dust buster and scoop cat litter and  report decreased pain from 10/10 to 5/10 LBP pain with these tasks  Baseline: bending, using the dust buster , scoop cat litter box with 10/10 pain  Goal status: INITIAL   7.  Pt will demo safe technique for stairs to navigate to her apt without decreased knee pain < 5/10 and mobility at [facility] . Baseline: going up and down stairs ( pt lives on 3rd floor of apt).  10/10, Goal Status: INITIAL    8. Pt will report no longer needing to use finger to completely empty her bowel movements  Baseline: Pt has to use her finger to eliminate stools because she feels the stool gets stuck  Goal:  Initial   Mariane Masters, PT 05/30/2023, 4:41 PM

## 2023-06-06 ENCOUNTER — Ambulatory Visit: Payer: Medicare HMO | Admitting: Physical Therapy

## 2023-06-07 ENCOUNTER — Ambulatory Visit (INDEPENDENT_AMBULATORY_CARE_PROVIDER_SITE_OTHER): Payer: Self-pay | Admitting: Family

## 2023-06-07 ENCOUNTER — Encounter: Payer: Self-pay | Admitting: Family

## 2023-06-07 VITALS — BP 140/80 | HR 73 | Ht 60.0 in | Wt 102.0 lb

## 2023-06-07 DIAGNOSIS — R7303 Prediabetes: Secondary | ICD-10-CM | POA: Diagnosis not present

## 2023-06-07 DIAGNOSIS — E559 Vitamin D deficiency, unspecified: Secondary | ICD-10-CM

## 2023-06-07 DIAGNOSIS — E782 Mixed hyperlipidemia: Secondary | ICD-10-CM | POA: Diagnosis not present

## 2023-06-07 DIAGNOSIS — R5383 Other fatigue: Secondary | ICD-10-CM

## 2023-06-07 DIAGNOSIS — E538 Deficiency of other specified B group vitamins: Secondary | ICD-10-CM

## 2023-06-07 DIAGNOSIS — Z0130 Encounter for examination of blood pressure without abnormal findings: Secondary | ICD-10-CM

## 2023-06-07 MED ORDER — CYCLOBENZAPRINE HCL 5 MG PO TABS: 5.0000 mg | ORAL_TABLET | Freq: Three times a day (TID) | ORAL | 2 refills | Status: DC | PRN

## 2023-06-08 ENCOUNTER — Telehealth: Payer: Self-pay

## 2023-06-08 LAB — CMP14+EGFR
ALT: 19 [IU]/L (ref 0–32)
AST: 19 [IU]/L (ref 0–40)
Albumin: 4.8 g/dL (ref 3.9–4.9)
Alkaline Phosphatase: 96 [IU]/L (ref 44–121)
BUN/Creatinine Ratio: 22 (ref 12–28)
BUN: 15 mg/dL (ref 8–27)
Bilirubin Total: <0.2 mg/dL (ref 0.0–1.2)
CO2: 25 mmol/L (ref 20–29)
Calcium: 10 mg/dL (ref 8.7–10.3)
Chloride: 100 mmol/L (ref 96–106)
Creatinine, Ser: 0.69 mg/dL (ref 0.57–1.00)
Globulin, Total: 2.6 g/dL (ref 1.5–4.5)
Glucose: 90 mg/dL (ref 70–99)
Potassium: 4.5 mmol/L (ref 3.5–5.2)
Sodium: 140 mmol/L (ref 134–144)
Total Protein: 7.4 g/dL (ref 6.0–8.5)
eGFR: 96 mL/min/{1.73_m2}

## 2023-06-08 LAB — CBC WITH DIFFERENTIAL/PLATELET
Basophils Absolute: 0.1 10*3/uL (ref 0.0–0.2)
Basos: 1 %
EOS (ABSOLUTE): 0 10*3/uL (ref 0.0–0.4)
Eos: 1 %
Hematocrit: 44.3 % (ref 34.0–46.6)
Hemoglobin: 14.5 g/dL (ref 11.1–15.9)
Immature Grans (Abs): 0 10*3/uL (ref 0.0–0.1)
Immature Granulocytes: 0 %
Lymphocytes Absolute: 1.8 10*3/uL (ref 0.7–3.1)
Lymphs: 24 %
MCH: 32.8 pg (ref 26.6–33.0)
MCHC: 32.7 g/dL (ref 31.5–35.7)
MCV: 100 fL — ABNORMAL HIGH (ref 79–97)
Monocytes Absolute: 0.5 10*3/uL (ref 0.1–0.9)
Monocytes: 7 %
Neutrophils Absolute: 5 10*3/uL (ref 1.4–7.0)
Neutrophils: 67 %
Platelets: 248 10*3/uL (ref 150–450)
RBC: 4.42 x10E6/uL (ref 3.77–5.28)
RDW: 12 % (ref 11.7–15.4)
WBC: 7.4 10*3/uL (ref 3.4–10.8)

## 2023-06-08 LAB — IRON,TIBC AND FERRITIN PANEL
Ferritin: 131 ng/mL (ref 15–150)
Iron Saturation: 18 % (ref 15–55)
Iron: 56 ug/dL (ref 27–139)
Total Iron Binding Capacity: 315 ug/dL (ref 250–450)
UIBC: 259 ug/dL (ref 118–369)

## 2023-06-08 LAB — VITAMIN D 25 HYDROXY (VIT D DEFICIENCY, FRACTURES): Vit D, 25-Hydroxy: 29.8 ng/mL — ABNORMAL LOW (ref 30.0–100.0)

## 2023-06-08 LAB — LIPID PANEL
Chol/HDL Ratio: 3.1 ratio (ref 0.0–4.4)
Cholesterol, Total: 188 mg/dL (ref 100–199)
HDL: 60 mg/dL (ref >39)
LDL Chol Calc (NIH): 110 mg/dL — ABNORMAL HIGH (ref 0–99)
Triglycerides: 98 mg/dL (ref 0–149)
VLDL Cholesterol Cal: 18 mg/dL (ref 5–40)

## 2023-06-08 LAB — VITAMIN B12: Vitamin B-12: 1090 pg/mL (ref 232–1245)

## 2023-06-08 LAB — TSH: TSH: 2.36 u[IU]/mL (ref 0.450–4.500)

## 2023-06-08 LAB — HEMOGLOBIN A1C
Est. average glucose Bld gHb Est-mCnc: 120 mg/dL
Hgb A1c MFr Bld: 5.8 % — ABNORMAL HIGH (ref 4.8–5.6)

## 2023-06-08 NOTE — Telephone Encounter (Signed)
Forgot to ask about BP meds. She is trying to stop smoking, she is vaping. Is this safe? Please advise.

## 2023-06-10 ENCOUNTER — Encounter: Payer: Self-pay | Admitting: Family

## 2023-06-10 NOTE — Progress Notes (Signed)
New Patient Office Visit  Subjective    Patient ID: Brooke Jenkins, female    DOB: 10/12/1955  Age: 67 y.o. MRN: [id number]  CC:  Chief Complaint  Patient presents with   Establish Care    NPE    HPI Brooke Jenkins presents to establish care Previous Primary Care provider/office:   she does have additional concerns to discuss today.   She has been having some issues with her sleep meds, says that she thinks the Rozerem has been causing her to have achy muscles.  She asks if it would be possible to switch her back to the cyclobenzaprine she was on previously, and stop the gabapentin, as she does not feel like it is helping her as much.   Asks if we might need to start her on blood pressure medications.  She has an elevated blood pressure in office today, but has not previously been diagnosed with hypertension.   Finally, she has switched to Vaping as she has been trying to quit smoking.  Would like to know if this is safe or not.   Needs labs No other concerns today    Outpatient Encounter Medications as of 06/07/2023  Medication Sig   butalbital-acetaminophen-caffeine (FIORICET) 50-325-40 MG tablet Take 1 tablet by mouth every 6 (six) hours as needed.   clonazePAM (KLONOPIN) 1 MG tablet    cloNIDine (CATAPRES) 0.2 MG tablet Take by mouth.   conjugated estrogens (PREMARIN) vaginal cream Place 1 applicator vaginally daily.   DAYVIGO 10 MG TABS Take 1 tablet by mouth at bedtime.   gabapentin (NEURONTIN) 600 MG tablet Take 600 mg by mouth at bedtime.   methylphenidate (RITALIN) 20 MG tablet Take by mouth.   ramelteon (ROZEREM) 8 MG tablet Take 8 mg by mouth at bedtime.   [DISCONTINUED] gabapentin (NEURONTIN) 300 MG capsule Take 300 mg by mouth 1 day or 1 dose.   [DISCONTINUED] lurasidone (LATUDA) 20 MG TABS tablet SMARTSIG:1 Tablet(s) By Mouth Every Evening   cyclobenzaprine (FLEXERIL) 5 MG tablet Take 1 tablet (5 mg total) by mouth 3 (three) times daily as needed.    Lurasidone HCl 60 MG TABS Take 1 tablet by mouth daily.   [DISCONTINUED] acyclovir (ZOVIRAX) 400 MG tablet TAKE 1 TABLET BY MOUTH 3 TIMES PER DAY FOR 5 DAYS AT FIRST SIGN OF OUTBREAK. DO NOT TAKE WITH ZANAFLEX (Patient not taking: Reported on 03/28/2023)   [DISCONTINUED] cetirizine (ZYRTEC) 10 MG tablet Take by mouth.   [DISCONTINUED] cyclobenzaprine (FLEXERIL) 5 MG tablet Take 1 tablet by mouth 3 (three) times daily as needed. (Patient not taking: Reported on 03/28/2023)   [DISCONTINUED] nystatin (MYCOSTATIN) 100000 UNIT/ML suspension Take 5 mLs by mouth 4 (four) times daily. (Patient not taking: Reported on 03/28/2023)   [DISCONTINUED] ondansetron (ZOFRAN-ODT) 4 MG disintegrating tablet Take 1 tablet (4 mg total) by mouth every 8 (eight) hours as needed for nausea or vomiting. (Patient not taking: Reported on 03/28/2023)   No facility-administered encounter medications on file as of 06/07/2023.    Past Medical History:  Diagnosis Date   Anxiety    Arthritis    Cataracts, bilateral    Change in bowel habits 01/02/2023   Chronic hepatitis C (HCC) 04/25/2010   Depression    History of hepatitis    History of nephrolithiasis 03/28/2016   Insomnia    Kidney stones    Tinnitus     Past Surgical History:  Procedure Laterality Date   CHOLECYSTECTOMY     COLONOSCOPY WITH PROPOFOL  N/A 05/19/2019   Procedure: COLONOSCOPY WITH PROPOFOL;  Surgeon: Toney Reil, MD;  Location: [facility] ENDOSCOPY;  Service: Gastroenterology;  Laterality: N/A;   ESOPHAGOGASTRODUODENOSCOPY (EGD) WITH PROPOFOL N/A 05/19/2019   Procedure: ESOPHAGOGASTRODUODENOSCOPY (EGD) WITH PROPOFOL;  Surgeon: Toney Reil, MD;  Location: [facility] ENDOSCOPY;  Service: Gastroenterology;  Laterality: N/A;   EXTRACORPOREAL SHOCK WAVE LITHOTRIPSY Left 11/03/2021   Procedure: EXTRACORPOREAL SHOCK WAVE LITHOTRIPSY (ESWL);  Surgeon: Riki Altes, MD;  Location: [facility] ORS;  Service: Urology;  Laterality: Left;   EXTRACORPOREAL SHOCK  WAVE LITHOTRIPSY Left 10/27/2021   Procedure: EXTRACORPOREAL SHOCK WAVE LITHOTRIPSY (ESWL);  Surgeon: Vanna Scotland, MD;  Location: [facility] ORS;  Service: Urology;  Laterality: Left;   HEMORROIDECTOMY     KIDNEY STONE SURGERY     TEMPOROMANDIBULAR JOINT SURGERY      Family History  Problem Relation Age of Onset   Dementia Mother    Alzheimer's disease Mother    Parkinson's disease Mother    Migraines Mother    Other Mother        Back issues   Other Father        Back Issues   Dementia Father    Arthritis Maternal Grandmother    Migraines Maternal Grandfather     Social History   Socioeconomic History   Marital status: Single    Spouse name: Not on file   Number of children: 0   Years of education: Not on file   Highest education level: Not on file  Occupational History   Not on file  Tobacco Use   Smoking status: Former    Current packs/day: 0.25    Average packs/day: 0.3 packs/day for 40.0 years (10.0 ttl pk-yrs)    Types: Cigarettes   Smokeless tobacco: Never  Vaping Use   Vaping status: Every Day  Substance and Sexual Activity   Alcohol use: No   Drug use: Not Currently   Sexual activity: Not Currently    Birth control/protection: Post-menopausal  Other Topics Concern   Not on file  Social History Narrative   Not on file   Social Determinants of Health   Financial Resource Strain: Low Risk  (10/18/2021)   Received from [facility]   Overall Financial Resource Strain (CARDIA)    Difficulty of Paying Living Expenses: Not hard at all  Food Insecurity: No Food Insecurity (10/18/2021)   Received from [facility]   Hunger Vital Sign    Worried About Running Out of Food in the Last Year: Never true    Ran Out of Food in the Last Year: Never true  Transportation Needs: No Transportation Needs (10/18/2021)   Received from [facility] - Transportation    In the past 12 months, has lack of transportation  kept you from medical appointments or from getting medications?: No    Lack of Transportation (Non-Medical): No  Physical Activity: Inactive (03/08/2021)   Received from [facility]   Exercise Vital Sign    Days of Exercise per Week: 0 days    Minutes of Exercise per Session: 0 min  Stress: Stress Concern Present (03/15/2021)   Received from [facility] - Occupational Stress Questionnaire    Feeling of Stress : Very much  Social Connections: Socially Isolated (03/08/2021)   Received from [facility]   Social Connection and Isolation Panel [NHANES]    Frequency of Communication with  Friends and Family: Never    Frequency of Social Gatherings with Friends and Family: Never    Attends Religious Services: 1 to 4 times per year    Active Member of [employer] or Organizations: No    Attends Banker Meetings: Never    Marital Status: Divorced  Catering manager Violence: Not on file    Review of Systems  Musculoskeletal:  Positive for myalgias.  All other systems reviewed and are negative.       Objective    BP (!) 140/80   Pulse 73   Ht 5' (1.524 m)   Wt 102 lb (46.3 kg)   SpO2 98%   BMI 19.92 kg/m   Physical Exam Vitals and nursing note reviewed.  Constitutional:      General: She is awake.     Appearance: Normal appearance. She is well-developed and normal weight.  HENT:     Head: Normocephalic.  Eyes:     Extraocular Movements: Extraocular movements intact.     Conjunctiva/sclera: Conjunctivae normal.     Pupils: Pupils are equal, round, and reactive to light.  Cardiovascular:     Rate and Rhythm: Normal rate.  Pulmonary:     Effort: Pulmonary effort is normal.  Neurological:     General: No focal deficit present.     Mental Status: She is alert and oriented to person, place, and time. Mental status is at baseline.  Psychiatric:        Mood and Affect: Mood  normal.        Behavior: Behavior normal. Behavior is cooperative.        Thought Content: Thought content normal.        Judgment: Judgment normal.        Assessment & Plan:   Problem List Items Addressed This Visit   None Visit Diagnoses     B12 deficiency due to diet    -  Primary   Relevant Orders   CMP14+EGFR (Completed)   Vitamin B12 (Completed)   CBC with Diff (Completed)   Mixed hyperlipidemia       Relevant Orders   Lipid panel (Completed)   CMP14+EGFR (Completed)   CBC with Diff (Completed)   Vitamin D deficiency, unspecified       Relevant Orders   VITAMIN D 25 Hydroxy (Vit-D Deficiency, Fractures) (Completed)   CMP14+EGFR (Completed)   CBC with Diff (Completed)   Prediabetes       Relevant Orders   CMP14+EGFR (Completed)   Hemoglobin A1c (Completed)   CBC with Diff (Completed)   Other fatigue       Relevant Orders   CMP14+EGFR (Completed)   TSH (Completed)   CBC with Diff (Completed)   Iron, TIBC and Ferritin Panel (Completed)      Checking labs today Will discuss results at follow up if not grossly abnormal.  Will also recheck blood pressure at that point, to ensure BP not still elevated.    Return in about 2 weeks (around 06/21/2023).   Total time spent: 45 minutes  Miki Kins, FNP  06/07/2023  This document may have been prepared by [facility] Voice Recognition software and as such may include unintentional dictation errors.

## 2023-06-10 NOTE — Progress Notes (Signed)
Sleep Questionnaire: 3/10

## 2023-06-11 ENCOUNTER — Telehealth: Payer: Self-pay | Admitting: Physical Therapy

## 2023-06-11 NOTE — Telephone Encounter (Signed)
Physical Therapist called to f/u with pt's apptthis week  because she was sick last week. Pt reported she will to cancel again this week.  Pt also explained she let her psychiatrist know she stopped taking a medicaiton he prescribed and went back on her previous medication. Since this switch, her arm pain/ weakness went away but she has had migraines .    Therapist asked pt to contact her doctor to report re: these Sx related to medication changes. Pt voiced agreement and will contact her MD.   Also explained to pt she will be put on cancellation list due to no more appts on her schedule and no openings yet on therapist's schedule.  She voiced understanding.

## 2023-06-13 ENCOUNTER — Encounter: Payer: Medicare HMO | Admitting: Physical Therapy

## 2023-06-21 ENCOUNTER — Encounter: Payer: Self-pay | Admitting: Family

## 2023-06-21 ENCOUNTER — Ambulatory Visit (INDEPENDENT_AMBULATORY_CARE_PROVIDER_SITE_OTHER): Payer: Self-pay | Admitting: Family

## 2023-06-21 VITALS — BP 118/70 | HR 100 | Ht 60.0 in | Wt 100.4 lb

## 2023-06-21 DIAGNOSIS — F3341 Major depressive disorder, recurrent, in partial remission: Secondary | ICD-10-CM | POA: Diagnosis not present

## 2023-06-21 DIAGNOSIS — Z0130 Encounter for examination of blood pressure without abnormal findings: Secondary | ICD-10-CM

## 2023-06-21 DIAGNOSIS — K219 Gastro-esophageal reflux disease without esophagitis: Secondary | ICD-10-CM | POA: Diagnosis not present

## 2023-06-21 DIAGNOSIS — F411 Generalized anxiety disorder: Secondary | ICD-10-CM | POA: Diagnosis not present

## 2023-06-21 DIAGNOSIS — E782 Mixed hyperlipidemia: Secondary | ICD-10-CM

## 2023-06-21 MED ORDER — VEOZAH 45 MG PO TABS: 45.0000 mg | ORAL_TABLET | Freq: Every day | ORAL | 4 refills | Status: DC

## 2023-06-21 MED ORDER — GABAPENTIN 100 MG PO CAPS: 100.0000 mg | ORAL_CAPSULE | Freq: Every day | ORAL | 0 refills | Status: DC

## 2023-06-26 ENCOUNTER — Telehealth: Payer: Self-pay

## 2023-07-03 ENCOUNTER — Ambulatory Visit (INDEPENDENT_AMBULATORY_CARE_PROVIDER_SITE_OTHER): Payer: Self-pay | Admitting: Cardiovascular Disease

## 2023-07-03 ENCOUNTER — Encounter: Payer: Self-pay | Admitting: Cardiovascular Disease

## 2023-07-03 VITALS — BP 108/82 | HR 79 | Ht 60.0 in | Wt 98.4 lb

## 2023-07-03 DIAGNOSIS — F17210 Nicotine dependence, cigarettes, uncomplicated: Secondary | ICD-10-CM | POA: Diagnosis not present

## 2023-07-03 DIAGNOSIS — N951 Menopausal and female climacteric states: Secondary | ICD-10-CM

## 2023-07-03 DIAGNOSIS — R0602 Shortness of breath: Secondary | ICD-10-CM | POA: Diagnosis not present

## 2023-07-03 DIAGNOSIS — F17200 Nicotine dependence, unspecified, uncomplicated: Secondary | ICD-10-CM

## 2023-07-03 DIAGNOSIS — R0789 Other chest pain: Secondary | ICD-10-CM | POA: Diagnosis not present

## 2023-07-03 DIAGNOSIS — E782 Mixed hyperlipidemia: Secondary | ICD-10-CM

## 2023-07-03 DIAGNOSIS — Z0130 Encounter for examination of blood pressure without abnormal findings: Secondary | ICD-10-CM

## 2023-07-03 MED ORDER — ROSUVASTATIN CALCIUM 5 MG PO TABS: 5.0000 mg | ORAL_TABLET | Freq: Every day | ORAL | 11 refills | Status: DC

## 2023-07-03 NOTE — Progress Notes (Signed)
Cardiology Office Note   Date:  07/03/2023   ID:  Brooke, Jenkins October 25, 1955, MRN [id number]  PCP:  Miki Kins, FNP  Cardiologist:  Adrian Blackwater, MD      History of Present Illness: Brooke Jenkins is a 67 y.o. female who presents for  Chief Complaint  Patient presents with   Establish Care    Family history of cardio issue, chest pains.    [id number]  smoker presents with chest pain like band around the chest  Chest Pain  This is a new problem. The current episode started more than 1 month ago. The problem occurs 2 to 4 times per day. The problem has been waxing and waning. The pain is at a severity of 5/10. The pain is moderate. The quality of the pain is described as tightness. The pain radiates to the left jaw and left arm. Associated symptoms include diaphoresis, dizziness, malaise/fatigue, numbness and shortness of breath. The pain is aggravated by exertion. The treatment provided mild relief. Risk factors include stress.      Past Medical History:  Diagnosis Date   Anxiety    Arthritis    Cataracts, bilateral    Change in bowel habits 01/02/2023   Chronic hepatitis C (HCC) 04/25/2010   Depression    History of hepatitis    History of nephrolithiasis 03/28/2016   Insomnia    Kidney stones    Tinnitus      Past Surgical History:  Procedure Laterality Date   CHOLECYSTECTOMY     COLONOSCOPY WITH PROPOFOL N/A 05/19/2019   Procedure: COLONOSCOPY WITH PROPOFOL;  Surgeon: Toney Reil, MD;  Location: [facility] ENDOSCOPY;  Service: Gastroenterology;  Laterality: N/A;   ESOPHAGOGASTRODUODENOSCOPY (EGD) WITH PROPOFOL N/A 05/19/2019   Procedure: ESOPHAGOGASTRODUODENOSCOPY (EGD) WITH PROPOFOL;  Surgeon: Toney Reil, MD;  Location: [facility] ENDOSCOPY;  Service: Gastroenterology;  Laterality: N/A;   EXTRACORPOREAL SHOCK WAVE LITHOTRIPSY Left 11/03/2021   Procedure: EXTRACORPOREAL SHOCK WAVE LITHOTRIPSY (ESWL);  Surgeon: Riki Altes, MD;  Location:  [facility] ORS;  Service: Urology;  Laterality: Left;   EXTRACORPOREAL SHOCK WAVE LITHOTRIPSY Left 10/27/2021   Procedure: EXTRACORPOREAL SHOCK WAVE LITHOTRIPSY (ESWL);  Surgeon: Vanna Scotland, MD;  Location: [facility] ORS;  Service: Urology;  Laterality: Left;   HEMORROIDECTOMY     KIDNEY STONE SURGERY     TEMPOROMANDIBULAR JOINT SURGERY       Current Outpatient Medications  Medication Sig Dispense Refill   butalbital-acetaminophen-caffeine (FIORICET) 50-325-40 MG tablet Take 1 tablet by mouth every 6 (six) hours as needed.     clonazePAM (KLONOPIN) 1 MG tablet      cloNIDine (CATAPRES) 0.2 MG tablet Take by mouth.     conjugated estrogens (PREMARIN) vaginal cream Place 1 applicator vaginally daily.     cyclobenzaprine (FLEXERIL) 5 MG tablet Take 1 tablet (5 mg total) by mouth 3 (three) times daily as needed. 90 tablet 2   DAYVIGO 10 MG TABS Take 1 tablet by mouth at bedtime.     Fezolinetant (VEOZAH) 45 MG TABS Take 1 tablet (45 mg total) by mouth daily. 30 tablet 4   gabapentin (NEURONTIN) 100 MG capsule Take 1 capsule (100 mg total) by mouth at bedtime. 30 capsule 0   Lurasidone HCl 60 MG TABS Take 1 tablet by mouth daily.     methylphenidate (RITALIN) 20 MG tablet Take by mouth.     rosuvastatin (CRESTOR) 5 MG tablet Take 1 tablet (5 mg total) by mouth daily. 30 tablet 11  No current facility-administered medications for this visit.    Allergies:   Amoxicillin-pot clavulanate, Bee venom, Mirabegron, Aspirin, Ciprofloxacin, Tramadol, and Vicodin [hydrocodone-acetaminophen]    Social History:   reports that she has quit smoking. Her smoking use included cigarettes. She has a 10 pack-year smoking history. She has never used smokeless tobacco. She reports that she does not currently use drugs. She reports that she does not drink alcohol.   Family History:  family history includes Alzheimer's disease in her mother; Arthritis in her maternal grandmother; Dementia in her father and mother;  Migraines in her maternal grandfather and mother; Other in her father and mother; Parkinson's disease in her mother.    ROS:     Review of Systems  Constitutional:  Positive for diaphoresis and malaise/fatigue.  HENT: Negative.    Eyes: Negative.   Respiratory:  Positive for shortness of breath.   Cardiovascular:  Positive for chest pain.  Gastrointestinal: Negative.   Genitourinary: Negative.   Musculoskeletal: Negative.   Skin: Negative.   Neurological:  Positive for dizziness and numbness.  Endo/Heme/Allergies: Negative.   Psychiatric/Behavioral: Negative.    All other systems reviewed and are negative.     All other systems are reviewed and negative.    PHYSICAL EXAM: VS:  BP 108/82   Pulse 79   Ht 5' (1.524 m)   Wt 98 lb 6.4 oz (44.6 kg)   SpO2 99%   BMI 19.22 kg/m  , BMI Body mass index is 19.22 kg/m. Last weight:  Wt Readings from Last 3 Encounters:  07/03/23 98 lb 6.4 oz (44.6 kg)  06/21/23 100 lb 6.4 oz (45.5 kg)  06/07/23 102 lb (46.3 kg)     Physical Exam Constitutional:      Appearance: Normal appearance.  Cardiovascular:     Rate and Rhythm: Normal rate and regular rhythm.     Heart sounds: Normal heart sounds.  Pulmonary:     Effort: Pulmonary effort is normal.     Breath sounds: Normal breath sounds.  Musculoskeletal:     Right lower leg: No edema.     Left lower leg: No edema.  Neurological:     Mental Status: She is alert.       EKG: NSR 80/min no acute changes, LAE  Recent Labs: 06/07/2023: ALT 19; BUN 15; Creatinine, Ser 0.69; Hemoglobin 14.5; Platelets 248; Potassium 4.5; Sodium 140; TSH 2.360    Lipid Panel    Component Value Date/Time   CHOL 188 06/07/2023 1221   TRIG 98 06/07/2023 1221   HDL 60 06/07/2023 1221   CHOLHDL 3.1 06/07/2023 1221   LDLCALC 110 (H) 06/07/2023 1221      Other studies Reviewed: Additional studies/ records that were reviewed today include:  Review of the above records demonstrates:       No  data to display            ASSESSMENT AND PLAN:    ICD-10-CM   1. Other chest pain  R07.89 PCV ECHOCARDIOGRAM COMPLETE    MYOCARDIAL PERFUSION IMAGING    rosuvastatin (CRESTOR) 5 MG tablet   Anginal type of chest pain, advise echo, stress test    2. Menopausal hot flushes  N95.1 PCV ECHOCARDIOGRAM COMPLETE    MYOCARDIAL PERFUSION IMAGING    rosuvastatin (CRESTOR) 5 MG tablet    3. Tobacco use disorder  F17.200 PCV ECHOCARDIOGRAM COMPLETE    MYOCARDIAL PERFUSION IMAGING    rosuvastatin (CRESTOR) 5 MG tablet    4. Cigarette nicotine dependence  without complication  F17.210 PCV ECHOCARDIOGRAM COMPLETE    MYOCARDIAL PERFUSION IMAGING    rosuvastatin (CRESTOR) 5 MG tablet    5. SOB (shortness of breath)  R06.02 PCV ECHOCARDIOGRAM COMPLETE    MYOCARDIAL PERFUSION IMAGING    rosuvastatin (CRESTOR) 5 MG tablet    6. Mixed hyperlipidemia  E78.2 PCV ECHOCARDIOGRAM COMPLETE    MYOCARDIAL PERFUSION IMAGING    rosuvastatin (CRESTOR) 5 MG tablet   LDL 110, slightly elevated,, advise diet and exercise but wants it treated, add crestor 5 mg daily. No need for repatha.       Problem List Items Addressed This Visit       Cardiovascular and Mediastinum   Menopausal hot flushes   Relevant Medications   rosuvastatin (CRESTOR) 5 MG tablet   Other Relevant Orders   PCV ECHOCARDIOGRAM COMPLETE   MYOCARDIAL PERFUSION IMAGING     Other   Nicotine dependence, uncomplicated   Relevant Medications   rosuvastatin (CRESTOR) 5 MG tablet   Other Relevant Orders   PCV ECHOCARDIOGRAM COMPLETE   MYOCARDIAL PERFUSION IMAGING   Tobacco use disorder   Relevant Medications   rosuvastatin (CRESTOR) 5 MG tablet   Other Relevant Orders   PCV ECHOCARDIOGRAM COMPLETE   MYOCARDIAL PERFUSION IMAGING   Other Visit Diagnoses       Other chest pain    -  Primary   Anginal type of chest pain, advise echo, stress test   Relevant Medications   rosuvastatin (CRESTOR) 5 MG tablet   Other Relevant  Orders   PCV ECHOCARDIOGRAM COMPLETE   MYOCARDIAL PERFUSION IMAGING     SOB (shortness of breath)       Relevant Medications   rosuvastatin (CRESTOR) 5 MG tablet   Other Relevant Orders   PCV ECHOCARDIOGRAM COMPLETE   MYOCARDIAL PERFUSION IMAGING     Mixed hyperlipidemia       LDL 110, slightly elevated,, advise diet and exercise but wants it treated, add crestor 5 mg daily. No need for repatha.   Relevant Medications   rosuvastatin (CRESTOR) 5 MG tablet   Other Relevant Orders   PCV ECHOCARDIOGRAM COMPLETE   MYOCARDIAL PERFUSION IMAGING          Disposition:   No follow-ups on file.    Total time spent: 50 minutes  Signed,  Adrian Blackwater, MD  07/03/2023 1:34 PM    [facility]

## 2023-07-05 ENCOUNTER — Ambulatory Visit: Payer: Medicare HMO | Attending: Obstetrics and Gynecology | Admitting: Physical Therapy

## 2023-07-05 ENCOUNTER — Ambulatory Visit (INDEPENDENT_AMBULATORY_CARE_PROVIDER_SITE_OTHER): Payer: Self-pay

## 2023-07-05 DIAGNOSIS — M25571 Pain in right ankle and joints of right foot: Secondary | ICD-10-CM | POA: Diagnosis present

## 2023-07-05 DIAGNOSIS — E782 Mixed hyperlipidemia: Secondary | ICD-10-CM

## 2023-07-05 DIAGNOSIS — M25562 Pain in left knee: Secondary | ICD-10-CM | POA: Diagnosis present

## 2023-07-05 DIAGNOSIS — G8929 Other chronic pain: Secondary | ICD-10-CM | POA: Diagnosis present

## 2023-07-05 DIAGNOSIS — I351 Nonrheumatic aortic (valve) insufficiency: Secondary | ICD-10-CM | POA: Diagnosis not present

## 2023-07-05 DIAGNOSIS — M25561 Pain in right knee: Secondary | ICD-10-CM | POA: Diagnosis present

## 2023-07-05 DIAGNOSIS — N951 Menopausal and female climacteric states: Secondary | ICD-10-CM

## 2023-07-05 DIAGNOSIS — F17200 Nicotine dependence, unspecified, uncomplicated: Secondary | ICD-10-CM

## 2023-07-05 DIAGNOSIS — R279 Unspecified lack of coordination: Secondary | ICD-10-CM | POA: Insufficient documentation

## 2023-07-05 DIAGNOSIS — I371 Nonrheumatic pulmonary valve insufficiency: Secondary | ICD-10-CM | POA: Diagnosis not present

## 2023-07-05 DIAGNOSIS — R0789 Other chest pain: Secondary | ICD-10-CM

## 2023-07-05 DIAGNOSIS — M5459 Other low back pain: Secondary | ICD-10-CM | POA: Diagnosis present

## 2023-07-05 DIAGNOSIS — M533 Sacrococcygeal disorders, not elsewhere classified: Secondary | ICD-10-CM | POA: Diagnosis present

## 2023-07-05 DIAGNOSIS — F17210 Nicotine dependence, cigarettes, uncomplicated: Secondary | ICD-10-CM

## 2023-07-05 DIAGNOSIS — R0602 Shortness of breath: Secondary | ICD-10-CM

## 2023-07-05 NOTE — Therapy (Signed)
OUTPATIENT PHYSICAL THERAPY TREATMENT    Patient Name: Brooke Jenkins MRN: [id number] DOB:09-05-1955, 67 y.o., female Today's Date: 07/05/2023   PT End of Session - 07/06/23 1258     Visit Number 5    Number of Visits 15    Date for PT Re-Evaluation 09/13/23    PT Start Time 1334    PT Stop Time 1420    PT Time Calculation (min) 46 min             Past Medical History:  Diagnosis Date   Anxiety    Arthritis    Cataracts, bilateral    Change in bowel habits 01/02/2023   Chronic hepatitis C (HCC) 04/25/2010   Depression    History of hepatitis    History of nephrolithiasis 03/28/2016   Insomnia    Kidney stones    Tinnitus    Past Surgical History:  Procedure Laterality Date   CHOLECYSTECTOMY     COLONOSCOPY WITH PROPOFOL N/A 05/19/2019   Procedure: COLONOSCOPY WITH PROPOFOL;  Surgeon: Toney Reil, MD;  Location: [facility] ENDOSCOPY;  Service: Gastroenterology;  Laterality: N/A;   ESOPHAGOGASTRODUODENOSCOPY (EGD) WITH PROPOFOL N/A 05/19/2019   Procedure: ESOPHAGOGASTRODUODENOSCOPY (EGD) WITH PROPOFOL;  Surgeon: Toney Reil, MD;  Location: [facility] ENDOSCOPY;  Service: Gastroenterology;  Laterality: N/A;   EXTRACORPOREAL SHOCK WAVE LITHOTRIPSY Left 11/03/2021   Procedure: EXTRACORPOREAL SHOCK WAVE LITHOTRIPSY (ESWL);  Surgeon: Riki Altes, MD;  Location: [facility] ORS;  Service: Urology;  Laterality: Left;   EXTRACORPOREAL SHOCK WAVE LITHOTRIPSY Left 10/27/2021   Procedure: EXTRACORPOREAL SHOCK WAVE LITHOTRIPSY (ESWL);  Surgeon: Vanna Scotland, MD;  Location: [facility] ORS;  Service: Urology;  Laterality: Left;   HEMORROIDECTOMY     KIDNEY STONE SURGERY     TEMPOROMANDIBULAR JOINT SURGERY     Patient Active Problem List   Diagnosis Date Noted   Hemorrhoids, external without complications 01/02/2023   Pelvic floor dysfunction 01/02/2023   Weight loss 02/26/2020   Migraine without aura and without status migrainosus, not intractable 08/28/2019   Nausea    Hx of  colonic polyps    Tobacco use disorder 11/05/2018   Post-menopausal atrophic vaginitis 10/18/2018   Seasonal allergic rhinitis due to pollen 11/13/2017   Chondromalacia of knee 11/06/2017   Chronic midline thoracic back pain 10/29/2017   Nephrolithiasis 03/28/2016   DDD (degenerative disc disease), cervical 01/12/2016   Tinnitus, bilateral 01/12/2016   ADD (attention deficit disorder) 12/10/2015   Chronic idiopathic constipation 12/07/2015   Depression, major, recurrent, in partial remission (HCC) 12/07/2015   GERD (gastroesophageal reflux disease) 12/07/2015   Nicotine dependence, uncomplicated 12/07/2015   Chronic insomnia 12/07/2015   Anxiety disorder 12/26/2013   Menopausal hot flushes 05/08/2013   Postmenopausal HRT (hormone replacement therapy) 05/08/2013   Osteoarthritis, hand 02/12/2013   Acute upper respiratory infection 03/03/2012   Acute midline low back pain without sciatica 04/11/2011   Neck pain 04/11/2011   Complete rupture of rotator cuff 10/06/2010   Depressive disorder 04/25/2010   PCP: Richardine Service   REFERRING PROVIDER: Arn Medal  REFERRING DIAG: Frequent urination   Rationale for Evaluation and Treatment Rehabilitation  THERAPY DIAG:  Sacrococcygeal disorders, not elsewhere classified  Other low back pain  Unspecified lack of coordination  Chronic pain of both knees  Pain in right ankle and joints of right foot  ONSET DATE:   SUBJECTIVE:             SUBJECTIVE STATEMENT  TODAY:   Pt reports she got echocardiogram today and has a  stress test scheduled 07/13/23.  Pt is upset about her car not working after getting it fixed at [employer].   PT related updates:   Pt 's LBP is still better by 65% with bending, scooping cat litter. When it is cold, her LBP increases.  Pt's knee pain is better by 90% better because she remembers to take wider stance like she practiced during last week's session.                                                                                                                                                                       SUBJECTIVE STATEMENT  ON EVAL 03/28/23 1) urinary urge without leakage and incomplete emptying urine and bowel  : started 6 months ago. She is not able to relax to go to the store and not rush back home because she feels she has to urinate. The urge is painful like her bladder is full and she has to get catherized. Pain is 10/10 when bladder is full   Pt has to use her finger to eliminate stools because she feels the stool gets stuck   2) CLBP:  "feels like someone has hit me in the back with a bat". Pt gets injections from Dr. Yves Dill.  At worst 10/10 with bending, using the dust buster,  scoop cat litter box   3) B knee pain: going up and down stairs ( pt lives on 3rd floor of apt).  10/10,  Pain is on the outside corner of knee cap . Coming down stairs, pt goes step to, up stairs, : step to step   4) R foot pain:  Pt dropped a pepper mill on R foot.2 month ago.  Pt is due to see podiatrist 04/11/23 . Hurts with walking   PERTINENT HISTORY:  ADD, major depressive anxiety disorder and sees a psychiatrist only. Enjoys TV all the time   PAIN:  Are you having pain? Yes: See above  PRECAUTIONS: None  WEIGHT BEARING RESTRICTIONS: No   FALLS:  Has patient fallen in last 6 months?Yes, in apartment 3 x when getting off her loveseat , fell on her knees and had a bruise, no injuries . Pt has been taking medications that impact her balance  LIVING ENVIRONMENT: Lives with: alone   Lives in: apartment, 3rd floor, walks 32 steps with rail  Stairs: No  OCCUPATION: on disability,   PLOF: IND   PATIENT GOALS: Relaxation, be able to relax to go to the store and not rush back home because she feels she has to urinate    OBJECTIVE:    [facility] PT Assessment -       Observation/Other Assessments   Observations stressed and continued to express her frustration about car  mechanics situation but  expressed she would still try to learn what she needs to learn for today's session      Other:   Other/ Comments simulated cleaning kitty litter box with semi tandem minisquat with poor alignment of L knee ( pt reported knee pain)      AROM   Overall AROM Comments difficulty with wide butterfly position due to back pain , posterior tilt of pelvis present             Pelvic Floor Special Questions -     External Perineal Exam through clothing: tenderness and tigghtness along anterior pelvic floor, posterior pelvic tilt, tight low ab             [facility] Adult PT Treatment/Exercise -       Therapeutic Activites    Other Therapeutic Activities guided relaxation to calm pt with her 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  Plan to stay up to date with her medical work up and modify POC as appropriate.    External manual Tx was applied to address tight pelvic floor which is likely contributing to pt's leakage issue and needing to strain to urinate. Pt will need more manual Tx to promote more anterior tilt of pelvis to lengthening pelvic floor and will need more back stretches to help achieve this.   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  Pt benefits from skilled PT.    OBJECTIVE IMPAIRMENTS decreased activity tolerance, decreased coordination, decreased endurance, decreased mobility, difficulty walking, decreased ROM, decreased strength, decreased safety awareness, hypomobility, increased muscle spasms, impaired flexibility, improper body mechanics, postural dysfunction, and pain. scar restrictions   ACTIVITY LIMITATIONS  self-care,  sleep, home chores, work tasks    PARTICIPATION LIMITATIONS:  community, gym activities    PERSONAL FACTORS        are also affecting patient's functional outcome.    REHAB POTENTIAL: Good   CLINICAL DECISION MAKING: Evolving/moderate complexity   EVALUATION COMPLEXITY: Moderate    PATIENT EDUCATION:    Education details: Showed pt anatomy images. Explained muscles attachments/ connection, physiology of deep core system/ spinal- thoracic-pelvis-lower kinetic chain as they relate to pt's presentation, Sx, and past Hx. Explained what and how these areas of deficits need to be restored to balance and function    See Therapeutic activity / neuromuscular re-education section  Answered pt's questions.   Person educated: Patient Education method: Explanation, Demonstration, Tactile cues, Verbal cues, and Handouts Education comprehension: verbalized understanding, returned demonstration, verbal cues required, tactile cues required, and needs  further education     PLAN: PT FREQUENCY: 1x/week   PT DURATION: 10 weeks   PLANNED INTERVENTIONS: Therapeutic exercises, Therapeutic activity, Neuromuscular re-education, Balance training, Gait training, Patient/Family education, Self Care, Joint mobilization, Spinal mobilization, Moist heat, Taping, and Manual therapy, dry needling.   PLAN FOR NEXT SESSION: See clinical impression for plan     GOALS: Goals reviewed with  patient? Yes  SHORT TERM GOALS: Target date: 04/25/2023    Pt will demo IND with HEP                    Baseline: Not IND            Goal status: INITIAL   LONG TERM GOALS: Target date: 06/06/2023   1.Pt will demo proper deep core coordination without chest breathing and optimal excursion of diaphragm/pelvic floor in order to promote spinal stability and pelvic floor function  Baseline: dyscoordination Goal status: INITIAL  2.  Pt will demo > 5 pt change on FOTO  to improve QOL and function  PFDI Urinary baseline -  Lower score = better function  Urinary Problem baseline-   Higher score = better function  Pelvic Pain baseline - Lower score = better function  Bowel  constipation baseline -   Higher score = better function  PFDI Bowel - Higher score = better function  Lumber baseline  - 42  Higher score = better function   Goal status: INITIAL  3.  Pt will demo proper body mechanics in against gravity tasks and ADLs  work tasks, fitness  to minimize straining pelvic floor / back    Baseline: not IND, improper form that places strain on pelvic floor  Goal status: INITIAL    4. Pt will demo increased gait speed > 1.3 m/s with reciprocal gait pattern, longer stride length  in order to ambulate safely in community and return to fitness routine  Baseline:  Goal status: INITIAL    5. Pt will report able to relax to go to the store and not rush back home because she feels she has to urinate  Baseline:not relax to go to the store and not rush back home because she feels she has to urinate  Goal status: INITIAL   6. Pt will demi proper squat / bend mechanics with dust buster and scoop cat litter and report decreased pain from 10/10 to 5/10 LBP pain with these tasks  Baseline: bending, using the dust buster , scoop cat litter box with 10/10 pain  Goal status: INITIAL   7.  Pt will demo safe technique for stairs to navigate to her apt without decreased knee pain <  5/10 and mobility at [facility] . Baseline: going up and down stairs ( pt lives on 3rd floor of apt).  10/10, Goal Status: INITIAL    8. Pt will report no longer needing to use finger to completely empty her bowel movements  Baseline: Pt has to use her finger to eliminate stools because she feels the stool gets stuck  Goal:  Initial   Mariane Masters, PT 07/05/2023, 1:48 PM

## 2023-07-06 NOTE — Patient Instructions (Signed)
Anteiror tilt of pelvis,  To lengthen pelvic floor when toileting  Lengthened breathing to calm and promote urination

## 2023-07-09 ENCOUNTER — Encounter: Payer: Self-pay | Admitting: Cardiovascular Disease

## 2023-07-16 ENCOUNTER — Ambulatory Visit (INDEPENDENT_AMBULATORY_CARE_PROVIDER_SITE_OTHER): Payer: Medicare HMO

## 2023-07-16 ENCOUNTER — Ambulatory Visit: Payer: Self-pay | Admitting: Cardiovascular Disease

## 2023-07-16 DIAGNOSIS — F17200 Nicotine dependence, unspecified, uncomplicated: Secondary | ICD-10-CM

## 2023-07-16 DIAGNOSIS — N951 Menopausal and female climacteric states: Secondary | ICD-10-CM

## 2023-07-16 DIAGNOSIS — E782 Mixed hyperlipidemia: Secondary | ICD-10-CM

## 2023-07-16 DIAGNOSIS — R0789 Other chest pain: Secondary | ICD-10-CM | POA: Diagnosis not present

## 2023-07-16 DIAGNOSIS — R0602 Shortness of breath: Secondary | ICD-10-CM | POA: Diagnosis not present

## 2023-07-16 DIAGNOSIS — F17210 Nicotine dependence, cigarettes, uncomplicated: Secondary | ICD-10-CM

## 2023-07-16 MED ORDER — TECHNETIUM TC 99M SESTAMIBI GENERIC - CARDIOLITE
10.2000 | Freq: Once | INTRAVENOUS | Status: AC | PRN
  Administered 2023-07-16: 10.2 via INTRAVENOUS

## 2023-07-16 MED ORDER — TECHNETIUM TC 99M SESTAMIBI GENERIC - CARDIOLITE
33.6000 | Freq: Once | INTRAVENOUS | Status: AC | PRN
  Administered 2023-07-16: 33.6 via INTRAVENOUS

## 2023-07-19 NOTE — Telephone Encounter (Signed)
 duplicate

## 2023-07-20 ENCOUNTER — Ambulatory Visit (INDEPENDENT_AMBULATORY_CARE_PROVIDER_SITE_OTHER): Payer: 59 | Admitting: Cardiovascular Disease

## 2023-07-20 ENCOUNTER — Other Ambulatory Visit: Payer: Self-pay | Admitting: Family

## 2023-07-20 ENCOUNTER — Encounter: Payer: Self-pay | Admitting: Cardiovascular Disease

## 2023-07-20 VITALS — BP 131/78 | HR 82 | Ht 60.0 in | Wt 98.0 lb

## 2023-07-20 DIAGNOSIS — F17200 Nicotine dependence, unspecified, uncomplicated: Secondary | ICD-10-CM

## 2023-07-20 DIAGNOSIS — R0602 Shortness of breath: Secondary | ICD-10-CM | POA: Diagnosis not present

## 2023-07-20 DIAGNOSIS — I259 Chronic ischemic heart disease, unspecified: Secondary | ICD-10-CM

## 2023-07-20 DIAGNOSIS — E782 Mixed hyperlipidemia: Secondary | ICD-10-CM | POA: Diagnosis not present

## 2023-07-20 DIAGNOSIS — R0789 Other chest pain: Secondary | ICD-10-CM

## 2023-07-20 DIAGNOSIS — Z0130 Encounter for examination of blood pressure without abnormal findings: Secondary | ICD-10-CM

## 2023-07-20 MED ORDER — METOPROLOL TARTRATE 25 MG PO TABS: ORAL_TABLET | ORAL | 0 refills | Status: DC

## 2023-07-20 NOTE — Progress Notes (Signed)
 Cardiology Office Note   Date:  07/20/2023   ID:  Brooke Jenkins, Brooke Jenkins December 17, 1955, MRN [id number]  PCP:  Orlean Alan HERO, FNP  Cardiologist:  Denyse Bathe, MD      History of Present Illness: Brooke Jenkins is a 68 y.o. female who presents for No chief complaint on file.   Still has arms and chest pain  Chest Pain  This is a recurrent problem. The current episode started 1 to 4 weeks ago.      Past Medical History:  Diagnosis Date   Anxiety    Arthritis    Cataracts, bilateral    Change in bowel habits 01/02/2023   Chronic hepatitis C (HCC) 04/25/2010   Depression    History of hepatitis    History of nephrolithiasis 03/28/2016   Insomnia    Kidney stones    Tinnitus      Past Surgical History:  Procedure Laterality Date   CHOLECYSTECTOMY     COLONOSCOPY WITH PROPOFOL  N/A 05/19/2019   Procedure: COLONOSCOPY WITH PROPOFOL ;  Surgeon: Unk Corinn Skiff, MD;  Location: [facility];  Service: Gastroenterology;  Laterality: N/A;   ESOPHAGOGASTRODUODENOSCOPY (EGD) WITH PROPOFOL  N/A 05/19/2019   Procedure: ESOPHAGOGASTRODUODENOSCOPY (EGD) WITH PROPOFOL ;  Surgeon: Unk Corinn Skiff, MD;  Location: [facility];  Service: Gastroenterology;  Laterality: N/A;   EXTRACORPOREAL SHOCK WAVE LITHOTRIPSY Left 11/03/2021   Procedure: EXTRACORPOREAL SHOCK WAVE LITHOTRIPSY (ESWL);  Surgeon: Twylla Glendia BROCKS, MD;  Location: [facility];  Service: Urology;  Laterality: Left;   EXTRACORPOREAL SHOCK WAVE LITHOTRIPSY Left 10/27/2021   Procedure: EXTRACORPOREAL SHOCK WAVE LITHOTRIPSY (ESWL);  Surgeon: Penne Knee, MD;  Location: [facility];  Service: Urology;  Laterality: Left;   HEMORROIDECTOMY     KIDNEY STONE SURGERY     TEMPOROMANDIBULAR JOINT SURGERY       Current Outpatient Medications  Medication Sig Dispense Refill   metoprolol  tartrate (LOPRESSOR ) 25 MG tablet Take 1 tab night before and 2 tab morning of test 90 minutes before arrival 3 tablet 0    butalbital -acetaminophen -caffeine  (FIORICET ) 50-325-40 MG tablet Take 1 tablet by mouth every 6 (six) hours as needed.     clonazePAM  (KLONOPIN ) 1 MG tablet      cloNIDine (CATAPRES) 0.2 MG tablet Take by mouth.     conjugated estrogens (PREMARIN) vaginal cream Place 1 applicator vaginally daily.     cyclobenzaprine  (FLEXERIL ) 5 MG tablet Take 1 tablet (5 mg total) by mouth 3 (three) times daily as needed. 90 tablet 2   DAYVIGO  10 MG TABS Take 1 tablet by mouth at bedtime.     Fezolinetant  (VEOZAH ) 45 MG TABS Take 1 tablet (45 mg total) by mouth daily. 30 tablet 4   gabapentin  (NEURONTIN ) 100 MG capsule Take 1 capsule (100 mg total) by mouth at bedtime. 30 capsule 0   Lurasidone  HCl 60 MG TABS Take 1 tablet by mouth daily.     methylphenidate (RITALIN) 20 MG tablet Take by mouth.     rosuvastatin  (CRESTOR ) 5 MG tablet Take 1 tablet (5 mg total) by mouth daily. 30 tablet 11   No current facility-administered medications for this visit.    Allergies:   Amoxicillin-pot clavulanate, Bee venom, Mirabegron , Aspirin , Ciprofloxacin, Tramadol, and Vicodin [hydrocodone-acetaminophen ]    Social History:   reports that she has quit smoking. Her smoking use included cigarettes. She has a 10 pack-year smoking history. She has never used smokeless tobacco. She reports that she does not currently use drugs. She reports that she does  not drink alcohol.   Family History:  family history includes Alzheimer's disease in her mother; Arthritis in her maternal grandmother; Dementia in her father and mother; Migraines in her maternal grandfather and mother; Other in her father and mother; Parkinson's disease in her mother.    ROS:     Review of Systems  Constitutional: Negative.   HENT: Negative.    Eyes: Negative.   Respiratory: Negative.    Cardiovascular:  Positive for chest pain.  Gastrointestinal: Negative.   Genitourinary: Negative.   Musculoskeletal: Negative.   Skin: Negative.   Neurological:  Negative.   Endo/Heme/Allergies: Negative.   Psychiatric/Behavioral: Negative.    All other systems reviewed and are negative.     All other systems are reviewed and negative.    PHYSICAL EXAM: VS:  BP 131/78   Pulse 82   Ht 5' (1.524 m)   Wt 98 lb (44.5 kg)   SpO2 98%   BMI 19.14 kg/m  , BMI Body mass index is 19.14 kg/m. Last weight:  Wt Readings from Last 3 Encounters:  07/20/23 98 lb (44.5 kg)  07/03/23 98 lb 6.4 oz (44.6 kg)  06/21/23 100 lb 6.4 oz (45.5 kg)     Physical Exam Constitutional:      Appearance: Normal appearance.  Cardiovascular:     Rate and Rhythm: Normal rate and regular rhythm.     Heart sounds: Normal heart sounds.  Pulmonary:     Effort: Pulmonary effort is normal.     Breath sounds: Normal breath sounds.  Musculoskeletal:     Right lower leg: No edema.     Left lower leg: No edema.  Neurological:     Mental Status: She is alert.       EKG:   Recent Labs: 06/07/2023: ALT 19; BUN 15; Creatinine, Ser 0.69; Hemoglobin 14.5; Platelets 248; Potassium 4.5; Sodium 140; TSH 2.360    Lipid Panel    Component Value Date/Time   CHOL 188 06/07/2023 1221   TRIG 98 06/07/2023 1221   HDL 60 06/07/2023 1221   CHOLHDL 3.1 06/07/2023 1221   LDLCALC 110 (H) 06/07/2023 1221      Other studies Reviewed: Additional studies/ records that were reviewed today include:  Review of the above records demonstrates:       No data to display            ASSESSMENT AND PLAN:    ICD-10-CM   1. SOB (shortness of breath)  R06.02 CT CORONARY MORPH W/CTA COR W/SCORE W/CA W/CM &/OR WO/CM    metoprolol  tartrate (LOPRESSOR ) 25 MG tablet   ECHO had normal LVEF trace MR    2. Other chest pain  R07.89 CT CORONARY MORPH W/CTA COR W/SCORE W/CA W/CM &/OR WO/CM    metoprolol  tartrate (LOPRESSOR ) 25 MG tablet    3. Tobacco use disorder  F17.200 CT CORONARY MORPH W/CTA COR W/SCORE W/CA W/CM &/OR WO/CM    metoprolol  tartrate (LOPRESSOR ) 25 MG tablet    4.  Mixed hyperlipidemia  E78.2 CT CORONARY MORPH W/CTA COR W/SCORE W/CA W/CM &/OR WO/CM    metoprolol  tartrate (LOPRESSOR ) 25 MG tablet    5. Chest pain due to myocardial ischemia, unspecified ischemic chest pain type  I25.9 CT CORONARY MORPH W/CTA COR W/SCORE W/CA W/CM &/OR WO/CM    metoprolol  tartrate (LOPRESSOR ) 25 MG tablet   Still chest pain and nuclear stress test was equivacal, thus advise CCTA. Creat 0.69 done on 11/24, will call in metoprolol .  Problem List Items Addressed This Visit       Other   Tobacco use disorder   Relevant Medications   metoprolol  tartrate (LOPRESSOR ) 25 MG tablet   Other Relevant Orders   CT CORONARY MORPH W/CTA COR W/SCORE W/CA W/CM &/OR WO/CM   Other Visit Diagnoses       SOB (shortness of breath)    -  Primary   ECHO had normal LVEF trace MR   Relevant Medications   metoprolol  tartrate (LOPRESSOR ) 25 MG tablet   Other Relevant Orders   CT CORONARY MORPH W/CTA COR W/SCORE W/CA W/CM &/OR WO/CM     Other chest pain       Relevant Medications   metoprolol  tartrate (LOPRESSOR ) 25 MG tablet   Other Relevant Orders   CT CORONARY MORPH W/CTA COR W/SCORE W/CA W/CM &/OR WO/CM     Mixed hyperlipidemia       Relevant Medications   metoprolol  tartrate (LOPRESSOR ) 25 MG tablet   Other Relevant Orders   CT CORONARY MORPH W/CTA COR W/SCORE W/CA W/CM &/OR WO/CM     Chest pain due to myocardial ischemia, unspecified ischemic chest pain type       Still chest pain and nuclear stress test was equivacal, thus advise CCTA. Creat 0.69 done on 11/24, will call in metoprolol .   Relevant Medications   metoprolol  tartrate (LOPRESSOR ) 25 MG tablet   Other Relevant Orders   CT CORONARY MORPH W/CTA COR W/SCORE W/CA W/CM &/OR WO/CM          Disposition:   Return in about 3 weeks (around 08/10/2023) for CCTa and f/u.    Total time spent: 30 minutes  Signed,  Denyse Bathe, MD  07/20/2023 3:26 PM    [facility]

## 2023-07-23 ENCOUNTER — Ambulatory Visit: Payer: 59 | Admitting: Family

## 2023-07-23 ENCOUNTER — Telehealth: Payer: Self-pay

## 2023-07-23 NOTE — Telephone Encounter (Signed)
 Lenward with [facility] pharmacy called stating that the patient was given a new medication by her physiatrist that could cause some potential issues with her heart and wanted to know what you're thoughs on her starting the medication was.. the name of the medication is  Austedo

## 2023-07-25 ENCOUNTER — Telehealth: Payer: Self-pay | Admitting: Family

## 2023-07-25 NOTE — Telephone Encounter (Signed)
 Patient left VM that she is weaning off of the gabapentin 600 mg and needs a new Rx for gabapentin 100 mg sent to pharmacy. Please advise.

## 2023-07-26 ENCOUNTER — Ambulatory Visit: Payer: 59 | Admitting: Family

## 2023-07-30 ENCOUNTER — Ambulatory Visit: Payer: 59 | Attending: Obstetrics and Gynecology | Admitting: Physical Therapy

## 2023-07-31 ENCOUNTER — Other Ambulatory Visit: Payer: Self-pay

## 2023-07-31 MED ORDER — GABAPENTIN 100 MG PO CAPS: 100.0000 mg | ORAL_CAPSULE | Freq: Every day | ORAL | 0 refills | Status: DC

## 2023-07-31 NOTE — Telephone Encounter (Signed)
 Already done and rx has been sent for refill per [facility] verbal

## 2023-08-06 ENCOUNTER — Ambulatory Visit: Payer: 59 | Admitting: Physical Therapy

## 2023-08-07 ENCOUNTER — Ambulatory Visit (INDEPENDENT_AMBULATORY_CARE_PROVIDER_SITE_OTHER): Payer: 59

## 2023-08-07 DIAGNOSIS — F17200 Nicotine dependence, unspecified, uncomplicated: Secondary | ICD-10-CM | POA: Diagnosis not present

## 2023-08-07 DIAGNOSIS — R0602 Shortness of breath: Secondary | ICD-10-CM

## 2023-08-07 DIAGNOSIS — R0789 Other chest pain: Secondary | ICD-10-CM

## 2023-08-07 DIAGNOSIS — I259 Chronic ischemic heart disease, unspecified: Secondary | ICD-10-CM | POA: Diagnosis not present

## 2023-08-07 DIAGNOSIS — R072 Precordial pain: Secondary | ICD-10-CM

## 2023-08-07 DIAGNOSIS — E782 Mixed hyperlipidemia: Secondary | ICD-10-CM

## 2023-08-07 MED ORDER — IOHEXOL 350 MG/ML SOLN
100.0000 mL | Freq: Once | INTRAVENOUS | Status: AC | PRN
  Administered 2023-08-07: 100 mL via INTRAVENOUS

## 2023-08-09 ENCOUNTER — Ambulatory Visit (INDEPENDENT_AMBULATORY_CARE_PROVIDER_SITE_OTHER): Payer: 59 | Admitting: Cardiovascular Disease

## 2023-08-09 ENCOUNTER — Ambulatory Visit: Payer: Self-pay | Admitting: Cardiovascular Disease

## 2023-08-09 ENCOUNTER — Encounter: Payer: Self-pay | Admitting: Cardiovascular Disease

## 2023-08-09 VITALS — BP 112/71 | HR 80 | Ht 60.0 in | Wt 100.0 lb

## 2023-08-09 DIAGNOSIS — R0602 Shortness of breath: Secondary | ICD-10-CM | POA: Diagnosis not present

## 2023-08-09 DIAGNOSIS — I200 Unstable angina: Secondary | ICD-10-CM | POA: Insufficient documentation

## 2023-08-09 DIAGNOSIS — F17200 Nicotine dependence, unspecified, uncomplicated: Secondary | ICD-10-CM | POA: Diagnosis not present

## 2023-08-09 DIAGNOSIS — R0789 Other chest pain: Secondary | ICD-10-CM | POA: Insufficient documentation

## 2023-08-09 DIAGNOSIS — I259 Chronic ischemic heart disease, unspecified: Secondary | ICD-10-CM | POA: Insufficient documentation

## 2023-08-09 DIAGNOSIS — R9439 Abnormal result of other cardiovascular function study: Secondary | ICD-10-CM

## 2023-08-09 DIAGNOSIS — E782 Mixed hyperlipidemia: Secondary | ICD-10-CM | POA: Insufficient documentation

## 2023-08-09 MED ORDER — SODIUM CHLORIDE 0.9% FLUSH: 3.0000 mL | Freq: Two times a day (BID) | INTRAVENOUS | Status: AC

## 2023-08-09 NOTE — Addendum Note (Signed)
Addended by: Adrian Blackwater A on: 08/09/2023 11:51 AM   Modules accepted: Orders

## 2023-08-09 NOTE — Progress Notes (Signed)
Cardiology Office Note   Date:  08/09/2023   ID:  Jaylany, Serrao 07-08-1956, MRN [id number]  PCP:  Miki Kins, FNP  Cardiologist:  Adrian Blackwater, MD      History of Present Illness: Brooke Jenkins is a 68 y.o. female who presents for No chief complaint on file.   Chest Pain  This is a recurrent problem. The onset quality is sudden. The problem has been waxing and waning. The pain is present in the substernal region. The pain is at a severity of 5/10. The quality of the pain is described as dull. The pain radiates to the left arm and right arm. Associated symptoms include shortness of breath.      Past Medical History:  Diagnosis Date   Anxiety    Arthritis    Cataracts, bilateral    Change in bowel habits 01/02/2023   Chronic hepatitis C (HCC) 04/25/2010   Depression    History of hepatitis    History of nephrolithiasis 03/28/2016   Insomnia    Kidney stones    Tinnitus      Past Surgical History:  Procedure Laterality Date   CHOLECYSTECTOMY     COLONOSCOPY WITH PROPOFOL N/A 05/19/2019   Procedure: COLONOSCOPY WITH PROPOFOL;  Surgeon: Toney Reil, MD;  Location: [facility];  Service: Gastroenterology;  Laterality: N/A;   ESOPHAGOGASTRODUODENOSCOPY (EGD) WITH PROPOFOL N/A 05/19/2019   Procedure: ESOPHAGOGASTRODUODENOSCOPY (EGD) WITH PROPOFOL;  Surgeon: Toney Reil, MD;  Location: [facility];  Service: Gastroenterology;  Laterality: N/A;   EXTRACORPOREAL SHOCK WAVE LITHOTRIPSY Left 11/03/2021   Procedure: EXTRACORPOREAL SHOCK WAVE LITHOTRIPSY (ESWL);  Surgeon: Riki Altes, MD;  Location: [facility];  Service: Urology;  Laterality: Left;   EXTRACORPOREAL SHOCK WAVE LITHOTRIPSY Left 10/27/2021   Procedure: EXTRACORPOREAL SHOCK WAVE LITHOTRIPSY (ESWL);  Surgeon: Vanna Scotland, MD;  Location: [facility];  Service: Urology;  Laterality: Left;   HEMORROIDECTOMY     KIDNEY STONE SURGERY     TEMPOROMANDIBULAR JOINT SURGERY        Current Outpatient Medications  Medication Sig Dispense Refill   butalbital-acetaminophen-caffeine (FIORICET) 50-325-40 MG tablet Take 1 tablet by mouth every 6 (six) hours as needed.     clonazePAM (KLONOPIN) 1 MG tablet      cloNIDine (CATAPRES) 0.2 MG tablet Take by mouth.     conjugated estrogens (PREMARIN) vaginal cream Place 1 applicator vaginally daily.     cyclobenzaprine (FLEXERIL) 5 MG tablet Take 1 tablet (5 mg total) by mouth 3 (three) times daily as needed. 90 tablet 2   DAYVIGO 10 MG TABS Take 1 tablet by mouth at bedtime.     Fezolinetant (VEOZAH) 45 MG TABS Take 1 tablet (45 mg total) by mouth daily. 30 tablet 4   gabapentin (NEURONTIN) 100 MG capsule Take 1 capsule (100 mg total) by mouth at bedtime. 30 capsule 0   Lurasidone HCl 60 MG TABS Take 1 tablet by mouth daily.     methylphenidate (RITALIN) 20 MG tablet Take by mouth.     metoprolol tartrate (LOPRESSOR) 25 MG tablet Take 1 tab night before and 2 tab morning of test 90 minutes before arrival 3 tablet 0   rosuvastatin (CRESTOR) 5 MG tablet Take 1 tablet (5 mg total) by mouth daily. 30 tablet 11   No current facility-administered medications for this visit.    Allergies:   Amoxicillin-pot clavulanate, Bee venom, Mirabegron, Aspirin, Ciprofloxacin, Tramadol, and Vicodin [hydrocodone-acetaminophen]    Social History:   reports  that she has quit smoking. Her smoking use included cigarettes. She has a 10 pack-year smoking history. She has never used smokeless tobacco. She reports that she does not currently use drugs. She reports that she does not drink alcohol.   Family History:  family history includes Alzheimer's disease in her mother; Arthritis in her maternal grandmother; Dementia in her father and mother; Migraines in her maternal grandfather and mother; Other in her father and mother; Parkinson's disease in her mother.    ROS:     Review of Systems  Constitutional: Negative.   HENT: Negative.    Eyes:  Negative.   Respiratory:  Positive for shortness of breath.   Cardiovascular:  Positive for chest pain.  Gastrointestinal: Negative.   Genitourinary: Negative.   Musculoskeletal: Negative.   Skin: Negative.   Neurological: Negative.   Endo/Heme/Allergies: Negative.   Psychiatric/Behavioral: Negative.    All other systems reviewed and are negative.     All other systems are reviewed and negative.    PHYSICAL EXAM: VS:  BP 112/71   Pulse 80   Ht 5' (1.524 m)   Wt 100 lb (45.4 kg)   SpO2 98%   BMI 19.53 kg/m  , BMI Body mass index is 19.53 kg/m. Last weight:  Wt Readings from Last 3 Encounters:  08/09/23 100 lb (45.4 kg)  07/20/23 98 lb (44.5 kg)  07/03/23 98 lb 6.4 oz (44.6 kg)     Physical Exam Constitutional:      Appearance: Normal appearance.  Cardiovascular:     Rate and Rhythm: Normal rate and regular rhythm.     Heart sounds: Normal heart sounds.  Pulmonary:     Effort: Pulmonary effort is normal.     Breath sounds: Normal breath sounds.  Musculoskeletal:     Right lower leg: No edema.     Left lower leg: No edema.  Neurological:     Mental Status: She is alert.       EKG:   Recent Labs: 06/07/2023: ALT 19; BUN 15; Creatinine, Ser 0.69; Hemoglobin 14.5; Platelets 248; Potassium 4.5; Sodium 140; TSH 2.360    Lipid Panel    Component Value Date/Time   CHOL 188 06/07/2023 1221   TRIG 98 06/07/2023 1221   HDL 60 06/07/2023 1221   CHOLHDL 3.1 06/07/2023 1221   LDLCALC 110 (H) 06/07/2023 1221      Other studies Reviewed: Additional studies/ records that were reviewed today include:  Review of the above records demonstrates:       No data to display            ASSESSMENT AND PLAN:    ICD-10-CM   1. Unstable angina (HCC)  I20.0    CCTA SHOWED CA SCORE 173.8 WITH RCA MID 95% DISEASE AND NORMAL LAD/LCX. ADVISE CARRDIAC  CATHTERIZATION.    2. Other chest pain  R07.89     3. SOB (shortness of breath)  R06.02     4. Tobacco use  disorder  F17.200     5. Chest pain due to myocardial ischemia, unspecified ischemic chest pain type  I25.9     6. Mixed hyperlipidemia  E78.2        Problem List Items Addressed This Visit       Other   Tobacco use disorder   Other Visit Diagnoses       Unstable angina (HCC)    -  Primary   CCTA SHOWED CA SCORE 173.8 WITH RCA MID 95% DISEASE AND NORMAL  LAD/LCX. ADVISE CARRDIAC  CATHTERIZATION.     Other chest pain         SOB (shortness of breath)         Chest pain due to myocardial ischemia, unspecified ischemic chest pain type         Mixed hyperlipidemia              Disposition:   Return for SET UP CARDIAC CAATH ASAP AND F/U.    Total time spent: 508 minutes  Signed,  Adrian Blackwater, MD  08/09/2023 11:16 AM    [facility]

## 2023-08-10 ENCOUNTER — Ambulatory Visit: Payer: 59 | Admitting: Cardiovascular Disease

## 2023-08-10 LAB — COMPREHENSIVE METABOLIC PANEL
ALT: 28 [IU]/L (ref 0–32)
AST: 24 [IU]/L (ref 0–40)
Albumin: 4.6 g/dL (ref 3.9–4.9)
Alkaline Phosphatase: 94 [IU]/L (ref 44–121)
BUN/Creatinine Ratio: 21 (ref 12–28)
BUN: 15 mg/dL (ref 8–27)
Bilirubin Total: 0.2 mg/dL (ref 0.0–1.2)
CO2: 24 mmol/L (ref 20–29)
Calcium: 9.9 mg/dL (ref 8.7–10.3)
Chloride: 104 mmol/L (ref 96–106)
Creatinine, Ser: 0.72 mg/dL (ref 0.57–1.00)
Globulin, Total: 2.6 g/dL (ref 1.5–4.5)
Glucose: 80 mg/dL (ref 70–99)
Potassium: 4.4 mmol/L (ref 3.5–5.2)
Sodium: 143 mmol/L (ref 134–144)
Total Protein: 7.2 g/dL (ref 6.0–8.5)
eGFR: 92 mL/min/{1.73_m2} (ref >59)

## 2023-08-10 LAB — CBC WITH DIFFERENTIAL/PLATELET
Basophils Absolute: 0.1 10*3/uL (ref 0.0–0.2)
Basos: 1 %
EOS (ABSOLUTE): 0.1 10*3/uL (ref 0.0–0.4)
Eos: 1 %
Hematocrit: 41.3 % (ref 34.0–46.6)
Hemoglobin: 13.3 g/dL (ref 11.1–15.9)
Immature Grans (Abs): 0 10*3/uL (ref 0.0–0.1)
Immature Granulocytes: 0 %
Lymphocytes Absolute: 1.7 10*3/uL (ref 0.7–3.1)
Lymphs: 30 %
MCH: 32.2 pg (ref 26.6–33.0)
MCHC: 32.2 g/dL (ref 31.5–35.7)
MCV: 100 fL — ABNORMAL HIGH (ref 79–97)
Monocytes Absolute: 0.4 10*3/uL (ref 0.1–0.9)
Monocytes: 7 %
Neutrophils Absolute: 3.4 10*3/uL (ref 1.4–7.0)
Neutrophils: 61 %
Platelets: 251 10*3/uL (ref 150–450)
RBC: 4.13 x10E6/uL (ref 3.77–5.28)
RDW: 11.5 % — ABNORMAL LOW (ref 11.7–15.4)
WBC: 5.6 10*3/uL (ref 3.4–10.8)

## 2023-08-13 ENCOUNTER — Ambulatory Visit: Payer: 59 | Admitting: Physical Therapy

## 2023-08-16 ENCOUNTER — Ambulatory Visit: Admit: 2023-08-16 | Payer: 59 | Admitting: Cardiovascular Disease

## 2023-08-16 ENCOUNTER — Encounter
Admission: AD | Disposition: A | Discharge status: Home / Self Care | Payer: Self-pay | Source: Ambulatory Visit | Attending: Hospitalist

## 2023-08-16 ENCOUNTER — Observation Stay: Payer: 59

## 2023-08-16 ENCOUNTER — Inpatient Hospital Stay
Admission: AD | Admit: 2023-08-16 | Discharge: 2023-08-18 | DRG: 271 | Disposition: A | Discharge status: Home / Self Care | Payer: 59 | Source: Ambulatory Visit | Attending: Hospitalist | Admitting: Hospitalist

## 2023-08-16 ENCOUNTER — Encounter: Payer: Self-pay | Admitting: Cardiovascular Disease

## 2023-08-16 ENCOUNTER — Other Ambulatory Visit: Payer: Self-pay

## 2023-08-16 DIAGNOSIS — F17210 Nicotine dependence, cigarettes, uncomplicated: Secondary | ICD-10-CM | POA: Diagnosis not present

## 2023-08-16 DIAGNOSIS — Z720 Tobacco use: Secondary | ICD-10-CM | POA: Insufficient documentation

## 2023-08-16 DIAGNOSIS — Z886 Allergy status to analgesic agent status: Secondary | ICD-10-CM

## 2023-08-16 DIAGNOSIS — K5904 Chronic idiopathic constipation: Secondary | ICD-10-CM | POA: Diagnosis present

## 2023-08-16 DIAGNOSIS — T81718A Complication of other artery following a procedure, not elsewhere classified, initial encounter: Principal | ICD-10-CM | POA: Diagnosis present

## 2023-08-16 DIAGNOSIS — I724 Aneurysm of artery of lower extremity: Secondary | ICD-10-CM | POA: Diagnosis present

## 2023-08-16 DIAGNOSIS — R0789 Other chest pain: Secondary | ICD-10-CM | POA: Insufficient documentation

## 2023-08-16 DIAGNOSIS — E782 Mixed hyperlipidemia: Secondary | ICD-10-CM | POA: Insufficient documentation

## 2023-08-16 DIAGNOSIS — I259 Chronic ischemic heart disease, unspecified: Secondary | ICD-10-CM | POA: Insufficient documentation

## 2023-08-16 DIAGNOSIS — Z87442 Personal history of urinary calculi: Secondary | ICD-10-CM

## 2023-08-16 DIAGNOSIS — I200 Unstable angina: Principal | ICD-10-CM

## 2023-08-16 DIAGNOSIS — Y840 Cardiac catheterization as the cause of abnormal reaction of the patient, or of later complication, without mention of misadventure at the time of the procedure: Secondary | ICD-10-CM | POA: Diagnosis present

## 2023-08-16 DIAGNOSIS — Z8261 Family history of arthritis: Secondary | ICD-10-CM

## 2023-08-16 DIAGNOSIS — G4700 Insomnia, unspecified: Secondary | ICD-10-CM | POA: Diagnosis present

## 2023-08-16 DIAGNOSIS — R3916 Straining to void: Secondary | ICD-10-CM | POA: Diagnosis present

## 2023-08-16 DIAGNOSIS — R0602 Shortness of breath: Secondary | ICD-10-CM | POA: Insufficient documentation

## 2023-08-16 DIAGNOSIS — I2510 Atherosclerotic heart disease of native coronary artery without angina pectoris: Secondary | ICD-10-CM

## 2023-08-16 DIAGNOSIS — Z820 Family history of epilepsy and other diseases of the nervous system: Secondary | ICD-10-CM

## 2023-08-16 DIAGNOSIS — R300 Dysuria: Secondary | ICD-10-CM | POA: Diagnosis present

## 2023-08-16 DIAGNOSIS — I25110 Atherosclerotic heart disease of native coronary artery with unstable angina pectoris: Secondary | ICD-10-CM | POA: Diagnosis present

## 2023-08-16 DIAGNOSIS — Z7902 Long term (current) use of antithrombotics/antiplatelets: Secondary | ICD-10-CM

## 2023-08-16 DIAGNOSIS — Z79899 Other long term (current) drug therapy: Secondary | ICD-10-CM

## 2023-08-16 DIAGNOSIS — Z883 Allergy status to other anti-infective agents status: Secondary | ICD-10-CM

## 2023-08-16 DIAGNOSIS — Z881 Allergy status to other antibiotic agents status: Secondary | ICD-10-CM

## 2023-08-16 DIAGNOSIS — R9439 Abnormal result of other cardiovascular function study: Secondary | ICD-10-CM

## 2023-08-16 DIAGNOSIS — B182 Chronic viral hepatitis C: Secondary | ICD-10-CM | POA: Diagnosis present

## 2023-08-16 DIAGNOSIS — Z9889 Other specified postprocedural states: Secondary | ICD-10-CM

## 2023-08-16 DIAGNOSIS — F17200 Nicotine dependence, unspecified, uncomplicated: Secondary | ICD-10-CM | POA: Insufficient documentation

## 2023-08-16 DIAGNOSIS — Z955 Presence of coronary angioplasty implant and graft: Secondary | ICD-10-CM

## 2023-08-16 DIAGNOSIS — Z7982 Long term (current) use of aspirin: Secondary | ICD-10-CM

## 2023-08-16 DIAGNOSIS — R41840 Attention and concentration deficit: Secondary | ICD-10-CM

## 2023-08-16 DIAGNOSIS — Z885 Allergy status to narcotic agent status: Secondary | ICD-10-CM

## 2023-08-16 DIAGNOSIS — F988 Other specified behavioral and emotional disorders with onset usually occurring in childhood and adolescence: Secondary | ICD-10-CM | POA: Diagnosis present

## 2023-08-16 DIAGNOSIS — I9789 Other postprocedural complications and disorders of the circulatory system, not elsewhere classified: Secondary | ICD-10-CM | POA: Diagnosis not present

## 2023-08-16 DIAGNOSIS — F17290 Nicotine dependence, other tobacco product, uncomplicated: Secondary | ICD-10-CM | POA: Diagnosis present

## 2023-08-16 DIAGNOSIS — M1990 Unspecified osteoarthritis, unspecified site: Secondary | ICD-10-CM | POA: Diagnosis present

## 2023-08-16 DIAGNOSIS — Z91030 Bee allergy status: Secondary | ICD-10-CM

## 2023-08-16 DIAGNOSIS — Z87892 Personal history of anaphylaxis: Secondary | ICD-10-CM

## 2023-08-16 DIAGNOSIS — F3341 Major depressive disorder, recurrent, in partial remission: Secondary | ICD-10-CM | POA: Diagnosis present

## 2023-08-16 DIAGNOSIS — L7632 Postprocedural hematoma of skin and subcutaneous tissue following other procedure: Secondary | ICD-10-CM | POA: Diagnosis present

## 2023-08-16 DIAGNOSIS — Z7901 Long term (current) use of anticoagulants: Secondary | ICD-10-CM

## 2023-08-16 DIAGNOSIS — F419 Anxiety disorder, unspecified: Secondary | ICD-10-CM | POA: Diagnosis present

## 2023-08-16 DIAGNOSIS — N951 Menopausal and female climacteric states: Secondary | ICD-10-CM

## 2023-08-16 HISTORY — PX: LOWER EXTREMITY ANGIOGRAPHY: CATH118251

## 2023-08-16 HISTORY — PX: CORONARY STENT INTERVENTION: CATH118234

## 2023-08-16 HISTORY — PX: LEFT HEART CATH AND CORONARY ANGIOGRAPHY: CATH118249

## 2023-08-16 LAB — CBC
HCT: 40.7 % (ref 36.0–46.0)
Hemoglobin: 13.1 g/dL (ref 12.0–15.0)
MCH: 33.1 pg (ref 26.0–34.0)
MCHC: 32.2 g/dL (ref 30.0–36.0)
MCV: 102.8 fL — ABNORMAL HIGH (ref 80.0–100.0)
Platelets: 214 10*3/uL (ref 150–400)
RBC: 3.96 MIL/uL (ref 3.87–5.11)
RDW: 12.2 % (ref 11.5–15.5)
WBC: 8.4 10*3/uL (ref 4.0–10.5)
nRBC: 0 % (ref 0.0–0.2)

## 2023-08-16 LAB — POCT ACTIVATED CLOTTING TIME: Activated Clotting Time: 302 s

## 2023-08-16 SURGERY — LEFT HEART CATH
Anesthesia: Moderate Sedation | Laterality: Right

## 2023-08-16 SURGERY — LOWER EXTREMITY ANGIOGRAPHY
Anesthesia: Moderate Sedation | Laterality: Right

## 2023-08-16 SURGERY — LEFT HEART CATH AND CORONARY ANGIOGRAPHY
Anesthesia: Moderate Sedation

## 2023-08-16 MED ORDER — ACETAMINOPHEN 325 MG PO TABS: 650.0000 mg | ORAL_TABLET | Freq: Four times a day (QID) | ORAL | Status: DC | PRN

## 2023-08-16 MED ORDER — MIDAZOLAM HCL 2 MG/2ML IJ SOLN
INTRAMUSCULAR | Status: DC | PRN
  Administered 2023-08-16 (×2): 1 mg via INTRAVENOUS

## 2023-08-16 MED ORDER — VANCOMYCIN HCL IN DEXTROSE 1-5 GM/200ML-% IV SOLN
1000.0000 mg | INTRAVENOUS | Status: DC
  Filled 2023-08-16: qty 200

## 2023-08-16 MED ORDER — FENTANYL CITRATE (PF) 100 MCG/2ML IJ SOLN
INTRAMUSCULAR | Status: AC
  Filled 2023-08-16: qty 2

## 2023-08-16 MED ORDER — SODIUM CHLORIDE 0.9 % IV SOLN
0.2500 mg/kg/h | INTRAVENOUS | Status: AC
  Filled 2023-08-16: qty 250

## 2023-08-16 MED ORDER — FENTANYL CITRATE (PF) 100 MCG/2ML IJ SOLN
INTRAMUSCULAR | Status: DC | PRN
  Administered 2023-08-16: 50 ug via INTRAVENOUS

## 2023-08-16 MED ORDER — HYDROMORPHONE HCL 1 MG/ML IJ SOLN
INTRAMUSCULAR | Status: AC
  Administered 2023-08-16: 0.5 mg via INTRAVENOUS
  Filled 2023-08-16: qty 0.5

## 2023-08-16 MED ORDER — SODIUM CHLORIDE 0.9 % WEIGHT BASED INFUSION: 3.0000 mL/kg/h | INTRAVENOUS | Status: DC

## 2023-08-16 MED ORDER — SENNOSIDES-DOCUSATE SODIUM 8.6-50 MG PO TABS
1.0000 | ORAL_TABLET | Freq: Every day | ORAL | Status: DC | PRN
Start: 2023-08-16 — End: 2023-08-18

## 2023-08-16 MED ORDER — POLYETHYLENE GLYCOL 3350 17 G PO PACK: 17.0000 g | PACK | Freq: Every day | ORAL | Status: DC | PRN

## 2023-08-16 MED ORDER — ASPIRIN 81 MG PO CHEW
CHEWABLE_TABLET | ORAL | Status: AC
Start: 2023-08-16 — End: ?
  Filled 2023-08-16: qty 4

## 2023-08-16 MED ORDER — ASPIRIN 81 MG PO CHEW
81.0000 mg | CHEWABLE_TABLET | Freq: Every day | ORAL | Status: DC
  Administered 2023-08-17 – 2023-08-18 (×2): 81 mg via ORAL
  Filled 2023-08-16 (×2): qty 1

## 2023-08-16 MED ORDER — IOHEXOL 350 MG/ML SOLN
75.0000 mL | Freq: Once | INTRAVENOUS | Status: AC | PRN
  Administered 2023-08-16: 75 mL via INTRAVENOUS

## 2023-08-16 MED ORDER — SODIUM CHLORIDE 0.9 % IV SOLN: INTRAVENOUS | Status: DC

## 2023-08-16 MED ORDER — ONDANSETRON HCL 4 MG/2ML IJ SOLN: 4.0000 mg | Freq: Four times a day (QID) | INTRAMUSCULAR | Status: DC | PRN

## 2023-08-16 MED ORDER — SODIUM CHLORIDE 0.9 % IV SOLN: 250.0000 mL | INTRAVENOUS | Status: DC | PRN

## 2023-08-16 MED ORDER — HEPARIN (PORCINE) IN NACL 1000-0.9 UT/500ML-% IV SOLN
INTRAVENOUS | Status: DC | PRN
  Administered 2023-08-16: 500 mL

## 2023-08-16 MED ORDER — IOHEXOL 300 MG/ML  SOLN
INTRAMUSCULAR | Status: DC | PRN
  Administered 2023-08-16: 71 mL

## 2023-08-16 MED ORDER — TICAGRELOR 90 MG PO TABS
90.0000 mg | ORAL_TABLET | Freq: Two times a day (BID) | ORAL | Status: DC
  Administered 2023-08-16 – 2023-08-18 (×4): 90 mg via ORAL
  Filled 2023-08-16 (×5): qty 1

## 2023-08-16 MED ORDER — BIVALIRUDIN BOLUS VIA INFUSION - CUPID
INTRAVENOUS | Status: DC | PRN
  Administered 2023-08-16: 33.825 mg via INTRAVENOUS

## 2023-08-16 MED ORDER — MIDAZOLAM HCL 2 MG/2ML IJ SOLN
INTRAMUSCULAR | Status: AC
  Filled 2023-08-16: qty 2

## 2023-08-16 MED ORDER — SODIUM CHLORIDE 0.9 % WEIGHT BASED INFUSION
3.0000 mL/kg/h | INTRAVENOUS | Status: DC
  Administered 2023-08-16: 3 mL/kg/h via INTRAVENOUS

## 2023-08-16 MED ORDER — HYDROMORPHONE HCL 1 MG/ML IJ SOLN: 0.5000 mg | INTRAMUSCULAR | Status: DC | PRN

## 2023-08-16 MED ORDER — ASPIRIN 81 MG PO CHEW: 81.0000 mg | CHEWABLE_TABLET | ORAL | Status: DC

## 2023-08-16 MED ORDER — HEPARIN (PORCINE) IN NACL 1000-0.9 UT/500ML-% IV SOLN
INTRAVENOUS | Status: AC
Start: 2023-08-16 — End: ?
  Filled 2023-08-16: qty 1000

## 2023-08-16 MED ORDER — TICAGRELOR 90 MG PO TABS
ORAL_TABLET | ORAL | Status: AC
  Filled 2023-08-16: qty 2

## 2023-08-16 MED ORDER — LEMBOREXANT 10 MG PO TABS: 10.0000 mg | ORAL_TABLET | Freq: Every day | ORAL | Status: DC

## 2023-08-16 MED ORDER — GABAPENTIN 100 MG PO CAPS
100.0000 mg | ORAL_CAPSULE | Freq: Every day | ORAL | Status: DC
  Administered 2023-08-16 – 2023-08-17 (×2): 200 mg via ORAL
  Filled 2023-08-16 (×3): qty 2

## 2023-08-16 MED ORDER — SODIUM CHLORIDE 0.9% FLUSH
3.0000 mL | INTRAVENOUS | Status: DC | PRN
Start: 2023-08-16 — End: 2023-08-16

## 2023-08-16 MED ORDER — TICAGRELOR 90 MG PO TABS
ORAL_TABLET | ORAL | Status: DC | PRN
  Administered 2023-08-16: 180 mg via ORAL

## 2023-08-16 MED ORDER — MIDAZOLAM HCL 2 MG/2ML IJ SOLN
INTRAMUSCULAR | Status: DC | PRN
  Administered 2023-08-16: 1 mg via INTRAVENOUS

## 2023-08-16 MED ORDER — SODIUM CHLORIDE 0.9 % WEIGHT BASED INFUSION: 1.0000 mL/kg/h | INTRAVENOUS | Status: DC

## 2023-08-16 MED ORDER — SODIUM CHLORIDE 0.9% FLUSH: 3.0000 mL | Freq: Two times a day (BID) | INTRAVENOUS | Status: DC

## 2023-08-16 MED ORDER — ASPIRIN 81 MG PO CHEW
CHEWABLE_TABLET | ORAL | Status: DC | PRN
  Administered 2023-08-16: 324 mg via ORAL

## 2023-08-16 MED ORDER — IODIXANOL 320 MG/ML IV SOLN
INTRAVENOUS | Status: DC | PRN
  Administered 2023-08-16: 40 mL

## 2023-08-16 MED ORDER — BIVALIRUDIN TRIFLUOROACETATE 250 MG IV SOLR
INTRAVENOUS | Status: AC
  Filled 2023-08-16: qty 250

## 2023-08-16 MED ORDER — ROSUVASTATIN CALCIUM 10 MG PO TABS
20.0000 mg | ORAL_TABLET | Freq: Every day | ORAL | Status: DC
  Administered 2023-08-16 – 2023-08-17 (×2): 20 mg via ORAL
  Filled 2023-08-16 (×2): qty 2
  Filled 2023-08-16: qty 1

## 2023-08-16 MED ORDER — BUTALBITAL-APAP-CAFFEINE 50-325-40 MG PO TABS
1.0000 | ORAL_TABLET | Freq: Four times a day (QID) | ORAL | Status: DC | PRN
  Administered 2023-08-16 – 2023-08-17 (×3): 1 via ORAL
  Filled 2023-08-16 (×4): qty 1

## 2023-08-16 MED ORDER — OXYCODONE HCL 5 MG PO TABS
5.0000 mg | ORAL_TABLET | ORAL | Status: DC | PRN
  Administered 2023-08-18: 5 mg via ORAL
  Filled 2023-08-16: qty 1

## 2023-08-16 MED ORDER — CLONAZEPAM 1 MG PO TABS
1.0000 mg | ORAL_TABLET | Freq: Every day | ORAL | Status: DC
  Administered 2023-08-16: 2 mg via ORAL
  Filled 2023-08-16: qty 2

## 2023-08-16 MED ORDER — HEPARIN (PORCINE) IN NACL 1000-0.9 UT/500ML-% IV SOLN
INTRAVENOUS | Status: DC | PRN
  Administered 2023-08-16 (×2): 500 mL

## 2023-08-16 MED ORDER — LIDOCAINE-EPINEPHRINE (PF) 1 %-1:200000 IJ SOLN
INTRAMUSCULAR | Status: DC | PRN
  Administered 2023-08-16: 10 mL

## 2023-08-16 MED ORDER — IOHEXOL 300 MG/ML  SOLN
INTRAMUSCULAR | Status: DC | PRN
  Administered 2023-08-16: 50 mL

## 2023-08-16 MED ORDER — HEPARIN SODIUM (PORCINE) 1000 UNIT/ML IJ SOLN
INTRAMUSCULAR | Status: DC | PRN
  Administered 2023-08-16: 2500 [IU] via INTRAVENOUS

## 2023-08-16 MED ORDER — LORATADINE 10 MG PO TABS
10.0000 mg | ORAL_TABLET | Freq: Every day | ORAL | Status: DC
  Administered 2023-08-17 – 2023-08-18 (×2): 10 mg via ORAL
  Filled 2023-08-16 (×3): qty 1

## 2023-08-16 MED ORDER — SODIUM CHLORIDE 0.9% FLUSH: 3.0000 mL | INTRAVENOUS | Status: DC | PRN

## 2023-08-16 MED ORDER — SODIUM CHLORIDE 0.9 % IV SOLN
INTRAVENOUS | Status: DC | PRN
  Administered 2023-08-16: 1.75 mg/kg/h via INTRAVENOUS

## 2023-08-16 MED ORDER — HEPARIN SODIUM (PORCINE) 1000 UNIT/ML IJ SOLN
INTRAMUSCULAR | Status: AC
  Filled 2023-08-16: qty 10

## 2023-08-16 MED ORDER — LURASIDONE HCL 20 MG PO TABS
60.0000 mg | ORAL_TABLET | Freq: Every day | ORAL | Status: DC
  Administered 2023-08-17 – 2023-08-18 (×2): 60 mg via ORAL
  Filled 2023-08-16 (×2): qty 3

## 2023-08-16 MED ORDER — LACTULOSE 10 GM/15ML PO SOLN: 20.0000 g | Freq: Every day | ORAL | Status: DC | PRN

## 2023-08-16 SURGICAL SUPPLY — 19 items
BALLN TREK RX 3.0X15 (BALLOONS) ×2
BALLOON TREK RX 3.0X15 (BALLOONS) IMPLANT
CATH INFINITI 5FR MULTPACK ANG (CATHETERS) IMPLANT
CATH VISTA GUIDE 6FR JR4 (CATHETERS) ×2
CATH VISTA GUIDE 6FR JR4 ECOPK (CATHETERS) IMPLANT
DEVICE CLOSURE MYNXGRIP 6/7F (Vascular Products) IMPLANT
KIT ENCORE 26 ADVANTAGE (KITS) IMPLANT
NDL PERC 18GX7CM (NEEDLE) IMPLANT
NEEDLE PERC 18GX7CM (NEEDLE) ×2
PACK CARDIAC CATH (CUSTOM PROCEDURE TRAY) ×2 IMPLANT
PROTECTION STATION PRESSURIZED (MISCELLANEOUS) ×2
SET ATX-X65L (MISCELLANEOUS) IMPLANT
SHEATH AVANTI 5FR X 11CM (SHEATH) IMPLANT
SHEATH AVANTI 6FR X 11CM (SHEATH) IMPLANT
STATION PROTECTION PRESSURIZED (MISCELLANEOUS) IMPLANT
STENT ONYX FRONTIER 3.0X15 (Permanent Stent) IMPLANT
TUBING CIL FLEX 10 FLL-RA (TUBING) IMPLANT
WIRE G HI TQ BMW 190 (WIRE) IMPLANT
WIRE GUIDERIGHT .035X150 (WIRE) IMPLANT

## 2023-08-16 SURGICAL SUPPLY — 17 items
BALLN LUTONIX DCB 6X60X130 (BALLOONS)
BALLOON LUTONIX DCB 6X60X130 (BALLOONS) IMPLANT
CATH ANGIO 5F PIGTAIL 65CM (CATHETERS) IMPLANT
COVER PROBE ULTRASOUND 5X96 (MISCELLANEOUS) IMPLANT
DEVICE PRESTO INFLATION (MISCELLANEOUS) IMPLANT
DEVICE STARCLOSE SE CLOSURE (Vascular Products) IMPLANT
GLIDEWIRE ADV .035X260CM (WIRE) IMPLANT
INTRODUCER 7FR 23CM (INTRODUCER) IMPLANT
PACK ANGIOGRAPHY (CUSTOM PROCEDURE TRAY) ×1 IMPLANT
SHEATH BRITE TIP 5FRX11 (SHEATH) IMPLANT
SHEATH BRITE TIP 7FRX11 (SHEATH) IMPLANT
STENT VIABAHN 8X50X120 (Permanent Stent) IMPLANT
STENT VIABAHN5X120X8X (Permanent Stent) ×1 IMPLANT
SYR MEDRAD MARK 7 150ML (SYRINGE) IMPLANT
TUBING CONTRAST HIGH PRESS 72 (TUBING) IMPLANT
WIRE G V18X300CM (WIRE) IMPLANT
WIRE GUIDERIGHT .035X150 (WIRE) IMPLANT

## 2023-08-16 NOTE — Assessment & Plan Note (Signed)
-   Continue home Klonopin.  PDMP reviewed and appropriate

## 2023-08-16 NOTE — Assessment & Plan Note (Signed)
-   Lipid panel pending - Increase home Crestor to 20 mg daily

## 2023-08-16 NOTE — Consult Note (Signed)
[facility] Vascular Consult Note  MRN : [id number]  Brooke Jenkins is a 68 y.o. (1955-09-23) female who presents with chief complaint of No chief complaint on file. Marland Kitchen  History of Present Illness: I am asked to see the patient by Dr. Juliann Pares for evaluation of right femoral artery pseudoaneurysm.  The patient underwent coronary intervention earlier today.  Has been complaining of right groin pain and swelling.  Had a CT scan of the abdomen pelvis which I have independently reviewed.  This demonstrates a clear right femoral artery pseudoaneurysm arising from the anterior portion of the right common femoral artery about a centimeter and 1/2 to 2 cm above the femoral bifurcation.  There is significant surrounding hematoma.  The patient has remained hemodynamically stable but does have significant pain in the groin.  Current Facility-Administered Medications  Medication Dose Route Frequency Provider Last Rate Last Admin   0.9% sodium chloride infusion  1 mL/kg/hr Intravenous Continuous Callwood, Dwayne D, MD 45.1 mL/hr at 08/16/23 1500 1 mL/kg/hr at 08/16/23 1500   acetaminophen (TYLENOL) tablet 650 mg  650 mg Oral Q6H PRN Verdene Lennert, MD       [START ON 08/17/2023] aspirin chewable tablet 81 mg  81 mg Oral Daily Callwood, Dwayne D, MD       butalbital-acetaminophen-caffeine (FIORICET) 50-325-40 MG per tablet 1 tablet  1 tablet Oral Q6H PRN Verdene Lennert, MD       clonazePAM (KLONOPIN) tablet 1-2 mg  1-2 mg Oral QHS Verdene Lennert, MD       gabapentin (NEURONTIN) capsule 100-200 mg  100-200 mg Oral QHS Verdene Lennert, MD       HYDROmorphone (DILAUDID) injection 0.5 mg  0.5 mg Intravenous Q4H PRN Verdene Lennert, MD   0.5 mg at 08/16/23 1725   lactulose (CHRONULAC) 10 GM/15ML solution 20 g  20 g Oral Daily PRN Verdene Lennert, MD       Lemborexant TABS 10 mg  10 mg Oral QHS Verdene Lennert, MD       [START ON 08/17/2023] loratadine (CLARITIN) tablet 10 mg  10 mg Oral  Daily Verdene Lennert, MD       [START ON 08/17/2023] lurasidone (LATUDA) tablet 60 mg  60 mg Oral Daily Verdene Lennert, MD       ondansetron (ZOFRAN) injection 4 mg  4 mg Intravenous Q6H PRN Verdene Lennert, MD       oxyCODONE (Oxy IR/ROXICODONE) immediate release tablet 5 mg  5 mg Oral Q4H PRN Verdene Lennert, MD       polyethylene glycol (MIRALAX / GLYCOLAX) packet 17 g  17 g Oral Daily PRN Verdene Lennert, MD       And   senna-docusate (Senokot-S) tablet 1 tablet  1 tablet Oral Daily PRN Verdene Lennert, MD       rosuvastatin (CRESTOR) tablet 20 mg  20 mg Oral Daily Verdene Lennert, MD       ticagrelor (BRILINTA) tablet 90 mg  90 mg Oral BID Callwood, Dwayne D, MD       Facility-Administered Medications Ordered in Other Encounters  Medication Dose Route Frequency Provider Last Rate Last Admin   sodium chloride flush (NS) 0.9 % injection 3 mL  3 mL Intravenous Q12H Laurier Nancy, MD        Past Medical History:  Diagnosis Date   Anxiety    Arthritis    Cataracts, bilateral    Change in bowel habits 01/02/2023   Chronic hepatitis C (HCC) 04/25/2010  Depression    History of hepatitis    History of nephrolithiasis 03/28/2016   Insomnia    Kidney stones    Tinnitus     Past Surgical History:  Procedure Laterality Date   CHOLECYSTECTOMY     COLONOSCOPY WITH PROPOFOL N/A 05/19/2019   Procedure: COLONOSCOPY WITH PROPOFOL;  Surgeon: Toney Reil, MD;  Location: [facility] ENDOSCOPY;  Service: Gastroenterology;  Laterality: N/A;   ESOPHAGOGASTRODUODENOSCOPY (EGD) WITH PROPOFOL N/A 05/19/2019   Procedure: ESOPHAGOGASTRODUODENOSCOPY (EGD) WITH PROPOFOL;  Surgeon: Toney Reil, MD;  Location[facility] ENDOSCOPY;  Service: Gastroenterology;  Laterality: N/A;   EXTRACORPOREAL SHOCK WAVE LITHOTRIPSY Left 11/03/2021   Procedure: EXTRACORPOREAL SHOCK WAVE LITHOTRIPSY (ESWL);  Surgeon: Riki Altes, MD;  Location: [facility] ORS;  Service: Urology;  Laterality: Left;   EXTRACORPOREAL SHOCK  WAVE LITHOTRIPSY Left 10/27/2021   Procedure: EXTRACORPOREAL SHOCK WAVE LITHOTRIPSY (ESWL);  Surgeon: Vanna Scotland, MD;  Location: [facility] ORS;  Service: Urology;  Laterality: Left;   HEMORROIDECTOMY     KIDNEY STONE SURGERY     TEMPOROMANDIBULAR JOINT SURGERY       Social History   Tobacco Use   Smoking status: Every Day    Current packs/day: 0.25    Average packs/day: 0.3 packs/day for 40.0 years (10.0 ttl pk-yrs)    Types: Cigarettes   Smokeless tobacco: Never   Tobacco comments:    Pt smokes 3 cigarettes a day   Vaping Use   Vaping status: Every Day   Substances: Nicotine, Flavoring  Substance Use Topics   Alcohol use: No   Drug use: Not Currently     Family History  Problem Relation Age of Onset   Dementia Mother    Alzheimer's disease Mother    Parkinson's disease Mother    Migraines Mother    Other Mother        Back issues   Other Father        Back Issues   Dementia Father    Arthritis Maternal Grandmother    Migraines Maternal Grandfather     Allergies  Allergen Reactions   Amoxicillin-Pot Clavulanate Nausea And Vomiting   Bee Venom Anaphylaxis    As a child   Mirabegron Cough, Other (See Comments) and Shortness Of Breath    SOB, sore throat, cough, tremor, upset stomachs   Aspirin Other (See Comments)    Abdominal Pain   Ciprofloxacin Nausea And Vomiting   Tramadol Nausea And Vomiting   Vicodin [Hydrocodone-Acetaminophen] Itching     REVIEW OF SYSTEMS (Negative unless checked)  Constitutional: [] Weight loss  [] Fever  [] Chills Cardiac: [x] Chest pain   [] Chest pressure   [] Palpitations   [] Shortness of breath when laying flat   [] Shortness of breath at rest   [x] Shortness of breath with exertion. Vascular:  [] Pain in legs with walking   [] Pain in legs at rest   [] Pain in legs when laying flat   [] Claudication   [] Pain in feet when walking  [] Pain in feet at rest  [] Pain in feet when laying flat   [] History of DVT   [] Phlebitis   [x] Swelling in legs    [] Varicose veins   [] Non-healing ulcers Pulmonary:   [] Uses home oxygen   [] Productive cough   [] Hemoptysis   [] Wheeze  [] COPD   [] Asthma Neurologic:  [] Dizziness  [] Blackouts   [] Seizures   [] History of stroke   [] History of TIA  [] Aphasia   [] Temporary blindness   [] Dysphagia   [] Weakness or numbness in arms   [] Weakness or  numbness in legs Musculoskeletal:  [x] Arthritis   [] Joint swelling   [] Joint pain   [] Low back pain Hematologic:  [] Easy bruising  [] Easy bleeding   [] Hypercoagulable state   [] Anemic  [] Hepatitis Gastrointestinal:  [] Blood in stool   [] Vomiting blood  [] Gastroesophageal reflux/heartburn   [] Difficulty swallowing. Genitourinary:  [] Chronic kidney disease   [] Difficult urination  [] Frequent urination  [] Burning with urination   [] Blood in urine Skin:  [] Rashes   [] Ulcers   [] Wounds Psychological:  [x] History of anxiety   [x]  History of major depression.  Physical Examination  Vitals:   08/16/23 1759 08/16/23 1814 08/16/23 1829 08/16/23 1844  BP: 137/67 118/67 118/73 114/74  Pulse: 78 78 79 81  Resp: (!) 22 18 11 17   Temp:      SpO2: 97% 96% 98% 98%  Weight:      Height:       Body mass index is 19.43 kg/m. Gen:  Thin, NAD Head: [place]/AT, No temporalis wasting.  Ear/Nose/Throat: Hearing grossly intact, nares w/o erythema or drainage, oropharynx w/o Erythema/Exudate Eyes: Sclera non-icteric, conjunctiva clear Neck: Trachea midline.  No JVD.  Pulmonary:  Good air movement, respirations not labored, equal bilaterally.  Cardiac: RRR, normal S1, S2. Vascular:  Vessel Right Left  Radial Palpable Palpable   Musculoskeletal: M/S 5/5 throughout.  Extremities without ischemic changes.  No deformity or atrophy. No edema.  Tender swelling in the right groin with mild redness and some hematoma. Neurologic: Sensation grossly intact in extremities.  Symmetrical.  Speech is fluent. Motor exam as listed above. Psychiatric: Judgment intact, Mood & affect appropriate for pt's  clinical situation. Dermatologic: No rashes or ulcers noted.  No cellulitis or open wounds. Lymph : No Cervical, Axillary, or Inguinal lymphadenopathy.     CBC Lab Results  Component Value Date   WBC 8.4 08/16/2023   HGB 13.1 08/16/2023   HCT 40.7 08/16/2023   MCV 102.8 (H) 08/16/2023   PLT 214 08/16/2023    BMET    Component Value Date/Time   NA 143 08/09/2023 1152   NA 141 11/18/2012 2143   K 4.4 08/09/2023 1152   K 3.5 11/18/2012 2143   CL 104 08/09/2023 1152   CL 107 11/18/2012 2143   CO2 24 08/09/2023 1152   CO2 30 11/18/2012 2143   GLUCOSE 80 08/09/2023 1152   GLUCOSE 169 (H) 10/25/2021 1347   GLUCOSE 97 11/18/2012 2143   BUN 15 08/09/2023 1152   BUN 15 11/18/2012 2143   CREATININE 0.72 08/09/2023 1152   CREATININE 0.69 11/18/2012 2143   CALCIUM 9.9 08/09/2023 1152   CALCIUM 9.3 11/18/2012 2143   GFRNONAA >60 10/25/2021 1347   GFRNONAA >60 11/18/2012 2143   GFRAA >60 08/22/2019 1853   GFRAA >60 11/18/2012 2143   Estimated Creatinine Clearance: 49.2 mL/min (by C-G formula based on SCr of 0.72 mg/dL).  COAG Lab Results  Component Value Date   INR 1.0 01/18/2021    Radiology CARDIAC CATHETERIZATION Result Date: 08/16/2023   Prox RCA lesion is 99% stenosed.   A stent was successfully placed.   Post intervention, there is a 0% residual stenosis.   Anticipated discharge date to be determined.   Recommend uninterrupted dual antiplatelet therapy with Aspirin 81mg  daily and Ticagrelor 90mg  twice daily for a minimum of 12 months (ACS-Class I recommendation). Conclusion 99% mid RCA TIMI-3 flow 6 [place] JR4 guide BMW wire Angiomax for anticoagulation Aspirin Brilinta Predilatation with a 3.0  x 15 mm trek to 10 atm Deployment of a  3.0 x 15 mm frontier Onyx to 18 atm DES stent Reduced from 99 down to 0% TIMI-3 flow maintained throughout Patient continued on Angiomax at reduced rate for an additional 2 hours Mynx deployed to right femoral artery Patient was transferred to  specials recovery Anticipate discharge within 23 hours   CARDIAC CATHETERIZATION Result Date: 08/16/2023   Prox RCA lesion is 99% stenosed.   LV end diastolic pressure is normal.   The left ventricular ejection fraction is greater than 65% by visual estimate.   There is no aortic valve stenosis.   Recommend dual antiplatelet therapy. Had 99% lesion mid RCA needing PCI/Stent, normal EF on echo.   CT CORONARY MORPH W/CTA COR W/SCORE W/CA W/CM &/OR WO/CM Result Date: 08/09/2023 Images from the original result were not included. Reason for Visit CCTA Computed Tomographic Angiography: Cardiac multidetector CT was performed paying particular attention to the coronary arteries for the diagnosis of: I48.91- Atrial Fibrillation, R07.9 - Chest Pain. DIAGNOSTIC DRUGS: Administered iohexol (Omnipaque) through an antecubital vein and images from the examination analyzed for the presecence and extent of coronary artery disease, using 3D image processing software.  100 mL of non-ionic contrast (Omnipaque) was used. TEST CONCLUSIONS Quality of study: Fair 1. Calcium score: 173.8 2. Right dominant system 3. Right Coronary Artery Mid 95% disease.  Left Circumflex Artery and Right Coronary Artery normal. Advise Left Heart Cath as soon as possible. Adrian Blackwater, MD Interpreting Physician      Assessment/Plan 1.  Right femoral pseudoaneurysm.  CT scan clearly positive even though not yet read by radiology.  This is a moderate-sized pseudoaneurysm with significant surrounding hematoma and would recommend proceeding with repair tonight.  There is significant risk of continued bleeding if untreated.  The neck does not appear particularly narrow so we will avoid thrombin injection.  There appears to be a good landing point above the femoral bifurcation that we will allow covered stent placement.  This would be preferred to open surgical therapy if possible.  Risk and benefits were discussed with the patient and she is agreeable  to proceed. 2.  Coronary artery disease.  Status post coronary intervention earlier today.  Can continue all anticoagulants and antiplatelets.    Festus Barren, MD  08/16/2023 8:13 PM    This note was created with Dragon medical transcription system.  Any error is purely unintentional

## 2023-08-16 NOTE — Assessment & Plan Note (Signed)
-   Encouraged that she completely cease all nicotine use, including vaping

## 2023-08-16 NOTE — Assessment & Plan Note (Signed)
-   Continue home lurasidone

## 2023-08-16 NOTE — Assessment & Plan Note (Signed)
-   Hold home Ritalin

## 2023-08-16 NOTE — H&P (Signed)
History and Physical    Patient: Brooke Jenkins ASN:[id number] DOB: March 06, 1956 DOA: 08/16/2023 DOS: the patient was seen and examined on 08/16/2023 PCP: Miki Kins, FNP  Patient coming from: Home  Chief Complaint: No chief complaint on file.  HPI: Brooke Jenkins is a 68 y.o. female with medical history significant of hyperlipidemia, arthritis, depression, chronic hepatitis C, who presents to the hospital for scheduled left heart cath.  TRH consulted for post op admission.  Brooke Jenkins states that for the last 2 months, she has been experiencing chest pain daily with radiation down her arms and associated shortness of breath.  She notes that the chest pain seemed to be occurring more often than it was not.  She denies any lower extremity swelling.  At this time, she denies any chest pain, palpitations, shortness of breath, nausea, vomiting, diarrhea, abdominal pain.  She notes that she continues to smoke 3 cigarettes a day and is wondering if she should switch to vaping.  She notes that she continues to smoke as it helps with her constipation.   Hospital course: On arrival to the hospital, patient was normotensive at 126/85 with heart rate of 73.  She was saturating at 98% on room air.  She was afebrile at 98.4.  No lab work available today.EKG obtained demonstrating sinus rhythm.  Patient underwent LHC with subsequent DES to mid-RCA.    Review of Systems: As mentioned in the history of present illness. All other systems reviewed and are negative.  Past Medical History:  Diagnosis Date   Anxiety    Arthritis    Cataracts, bilateral    Change in bowel habits 01/02/2023   Chronic hepatitis C (HCC) 04/25/2010   Depression    History of hepatitis    History of nephrolithiasis 03/28/2016   Insomnia    Kidney stones    Tinnitus    Past Surgical History:  Procedure Laterality Date   CHOLECYSTECTOMY     COLONOSCOPY WITH PROPOFOL N/A 05/19/2019   Procedure: COLONOSCOPY WITH  PROPOFOL;  Surgeon: Toney Reil, MD;  Location: [facility] ENDOSCOPY;  Service: Gastroenterology;  Laterality: N/A;   ESOPHAGOGASTRODUODENOSCOPY (EGD) WITH PROPOFOL N/A 05/19/2019   Procedure: ESOPHAGOGASTRODUODENOSCOPY (EGD) WITH PROPOFOL;  Surgeon: Toney Reil, MD;  Location: [facility] ENDOSCOPY;  Service: Gastroenterology;  Laterality: N/A;   EXTRACORPOREAL SHOCK WAVE LITHOTRIPSY Left 11/03/2021   Procedure: EXTRACORPOREAL SHOCK WAVE LITHOTRIPSY (ESWL);  Surgeon: Riki Altes, MD;  Location: [facility] ORS;  Service: Urology;  Laterality: Left;   EXTRACORPOREAL SHOCK WAVE LITHOTRIPSY Left 10/27/2021   Procedure: EXTRACORPOREAL SHOCK WAVE LITHOTRIPSY (ESWL);  Surgeon: Vanna Scotland, MD;  Location: [facility] ORS;  Service: Urology;  Laterality: Left;   HEMORROIDECTOMY     KIDNEY STONE SURGERY     TEMPOROMANDIBULAR JOINT SURGERY     Social History:  reports that she has been smoking cigarettes. She has a 10 pack-year smoking history. She has never used smokeless tobacco. She reports that she does not currently use drugs. She reports that she does not drink alcohol.  Allergies  Allergen Reactions   Amoxicillin-Pot Clavulanate Nausea And Vomiting   Bee Venom Anaphylaxis    As a child   Mirabegron Cough, Other (See Comments) and Shortness Of Breath    SOB, sore throat, cough, tremor, upset stomachs   Aspirin Other (See Comments)    Abdominal Pain   Ciprofloxacin Nausea And Vomiting   Tramadol Nausea And Vomiting   Vicodin [Hydrocodone-Acetaminophen] Itching    Family History  Problem Relation  Age of Onset   Dementia Mother    Alzheimer's disease Mother    Parkinson's disease Mother    Migraines Mother    Other Mother        Back issues   Other Father        Back Issues   Dementia Father    Arthritis Maternal Grandmother    Migraines Maternal Grandfather     Prior to Admission medications   Medication Sig Start Date End Date Taking? Authorizing Provider  acetaminophen (TYLENOL)  500 MG tablet Take 1,000 mg by mouth in the morning.   Yes [provider]  butalbital-acetaminophen-caffeine (FIORICET) 50-325-40 MG tablet Take 1 tablet by mouth every 6 (six) hours as needed for migraine. 09/23/20  Yes [provider]  cetirizine (ZYRTEC) 10 MG tablet Take 10 mg by mouth daily. 08/04/23  Yes [provider]  clonazePAM (KLONOPIN) 1 MG tablet Take 1-2 mg by mouth at bedtime. 04/28/19  Yes [provider]  cloNIDine (CATAPRES) 0.2 MG tablet Take 0.2 mg by mouth daily as needed (hot flashes). 01/03/18  Yes [provider]  cyclobenzaprine (FLEXERIL) 5 MG tablet Take 1 tablet (5 mg total) by mouth 3 (three) times daily as needed. 06/07/23  Yes Miki Kins, FNP  DAYVIGO 10 MG TABS Take 10 mg by mouth at bedtime. 01/04/21  Yes [provider]  docusate sodium (COLACE) 100 MG capsule Take 100 mg by mouth daily.   Yes [provider]  gabapentin (NEURONTIN) 100 MG capsule Take 1 capsule (100 mg total) by mouth at bedtime. Patient taking differently: Take 100-200 mg by mouth at bedtime. 07/31/23  Yes Miki Kins, FNP  Lurasidone HCl 60 MG TABS Take 60 mg by mouth daily.   Yes [provider]  methylphenidate (RITALIN) 10 MG tablet Take 10 mg by mouth in the morning.   Yes [provider]  rosuvastatin (CRESTOR) 5 MG tablet Take 1 tablet (5 mg total) by mouth daily. 07/03/23 07/02/24 Yes Laurier Nancy, MD  Fezolinetant (VEOZAH) 45 MG TABS Take 1 tablet (45 mg total) by mouth daily. Patient not taking: Reported on 08/13/2023 06/21/23   Miki Kins, FNP  metoprolol tartrate (LOPRESSOR) 25 MG tablet Take 1 tab night before and 2 tab morning of test 90 minutes before arrival Patient not taking: Reported on 08/13/2023 07/20/23   Laurier Nancy, MD  OVER THE COUNTER MEDICATION Take 1 capsule by mouth daily. Nutrafol Patient not taking: Reported on 08/16/2023    [provider]    Physical  Exam: Vitals:   08/16/23 1453 08/16/23 1503 08/16/23 1515 08/16/23 1530  BP: (!) 151/103 136/74 128/78 121/69  Pulse: 71 75 76 74  Resp: 13  14 19   Temp:      SpO2: 98% 97% 96% 96%  Weight:      Height:       Physical Exam Vitals and nursing note reviewed.  Constitutional:      General: She is not in acute distress.    Appearance: She is normal weight.  HENT:     Mouth/Throat:     Mouth: Mucous membranes are dry.  Cardiovascular:     Rate and Rhythm: Normal rate and regular rhythm.     Heart sounds: No murmur heard.    No gallop.  Pulmonary:     Effort: Pulmonary effort is normal.     Breath sounds: Normal breath sounds.  Abdominal:     General: Bowel sounds are  normal.     Palpations: Abdomen is soft.     Tenderness: There is no abdominal tenderness.  Musculoskeletal:     Right lower leg: No edema.     Left lower leg: No edema.  Skin:    General: Skin is warm and dry.  Neurological:     General: No focal deficit present.     Mental Status: She is alert and oriented to person, place, and time. Mental status is at baseline.  Psychiatric:        Mood and Affect: Mood normal.        Behavior: Behavior normal.    Data Reviewed: CBC, BMP, lipid panel pending  EKG personally reviewed.  Sinus rhythm with a rate of 76.  Minimal Q waves present in inferior leads that does not meet criteria.  Otherwise no ischemic changes.  CARDIAC CATHETERIZATION Result Date: 08/16/2023   Prox RCA lesion is 99% stenosed.   LV end diastolic pressure is normal.   The left ventricular ejection fraction is greater than 65% by visual estimate.   There is no aortic valve stenosis.   Recommend dual antiplatelet therapy. Had 99% lesion mid RCA needing PCI/Stent, normal EF on echo.   CARDIAC CATHETERIZATION Result Date: 08/16/2023   Prox RCA lesion is 99% stenosed.   A stent was successfully placed.   Post intervention, there is a 0% residual stenosis.   Results are pending, will review when  available.  Assessment and Plan:  * Unstable angina (HCC) Patient was seen on 1/23 at which time she endorsed intermittent chest pain both at rest and with exertion, with associated left arm involvement and shortness of breath concerning for unstable angina.  CT coronary study concerning for RCA disease.  Patient scheduled for LHC today, with 99% blockage of mid RCA, s/p DES.  Echocardiogram obtained in December 2024 with preserved LVEF and G1DD.  - Cardiology consulted; appreciate their recommendations - Telemetry monitoring - Continue DAPT per cardiology - Lipid panel ordered - Increase home Crestor to high intensity dosing  Tobacco use - Encouraged that she completely cease all nicotine use, including vaping  Mixed hyperlipidemia - Lipid panel pending - Increase home Crestor to 20 mg daily  Insomnia - Continue home Dayvigo.  PDMP reviewed and appropriate  Depression, major, recurrent, in partial remission (HCC) - Continue home lurasidone  Chronic idiopathic constipation Patient stated that she continues to smoke especially in the mornings with her coffee to help her have a bowel movement.  - Trial of MiraLAX plus senna for mild constipation - Trial of lactulose for moderate to severe constipation  Anxiety disorder - Continue home Klonopin.  PDMP reviewed and appropriate  ADD (attention deficit disorder) - Hold home Ritalin  Advance Care Planning:   Code Status: Full Code   Consults: Cardiology  Family Communication: No family at bedside  Severity of Illness: The appropriate patient status for this patient is INPATIENT. Inpatient status is judged to be reasonable and necessary in order to provide the required intensity of service to ensure the patient's safety. The patient's presenting symptoms, physical exam findings, and initial radiographic and laboratory data in the context of their chronic comorbidities is felt to place them at high risk for further clinical  deterioration. Furthermore, it is not anticipated that the patient will be medically stable for discharge from the hospital within 2 midnights of admission.   * I certify that at the point of admission it is my clinical judgment that the patient will require  inpatient hospital care spanning beyond 2 midnights from the point of admission due to high intensity of service, high risk for further deterioration and high frequency of surveillance required.*  Author: Verdene Lennert, MD 08/16/2023 3:43 PM  For on call review [email or web address].

## 2023-08-16 NOTE — Assessment & Plan Note (Signed)
Patient was seen on 1/23 at which time she endorsed intermittent chest pain both at rest and with exertion, with associated left arm involvement and shortness of breath concerning for unstable angina.  CT coronary study concerning for RCA disease.  Patient scheduled for LHC today, with 99% blockage of mid RCA, s/p DES.  Echocardiogram obtained in December 2024 with preserved LVEF and G1DD.  - Cardiology consulted; appreciate their recommendations - Telemetry monitoring - Continue DAPT per cardiology - Lipid panel ordered - Increase home Crestor to high intensity dosing

## 2023-08-16 NOTE — CV Procedure (Signed)
Brief post PCI note Referred by Dr. Welton Flakes for intervention of mid right coronary artery 99% lesion TIMI-3 flow  Patient underwent PCI and stent DES 3.0 x 15 mm frontier Onyx up to18 atm  Lesion reduced from 99 down to 0%  TIMI-3  flow maintained throughout  Patient to maintain on Brilinta aspirin x 6 months  Angiomax at reduced rate for 2 hours  Anticipate discharge tomorrow  Cardiac care transferred back to Dr. Welton Flakes  Full PCI note to follow  [facility]

## 2023-08-16 NOTE — Assessment & Plan Note (Addendum)
-   Continue home Dayvigo.  PDMP reviewed and appropriate

## 2023-08-16 NOTE — Assessment & Plan Note (Signed)
Patient stated that she continues to smoke especially in the mornings with her coffee to help her have a bowel movement.  - Trial of MiraLAX plus senna for mild constipation - Trial of lactulose for moderate to severe constipation

## 2023-08-16 NOTE — Progress Notes (Signed)
Dr. Juliann Pares recently to bedside to assess pt. States he does not feel hematoma but due to discomfort, has ordered a CT for pt. This RN called CT who did not have an open room but will call back when one is availabe. 20G inserted above wrist for scan.   Also attempted foley cath with verbal from Dr. Huel Cote d/t pt not being able to void. No success with 1 attempt and pt was uncomfortable during attempt. Bladder scan shows in bladder. Dr. Huel Cote made aware.

## 2023-08-16 NOTE — Consult Note (Signed)
Brooke Jenkins is a 68 y.o. female  [id number]  Primary Cardiologist: Adrian Blackwater Reason for Consultation: Unstable angina with 99% mid RCA lesion requiring PCI and stenting.  HPI: This is a 68 year old white female with a past medical history of anxiety disorder kidney stones hepatitis who presented to my office for evaluation of chest pain which was pressure type associated with shortness of breath and diaphoresis.  Chest pain was waxing and waning and underwent nuclear stress test which was abnormal in the inferior wall and underwent CCTA.  CCTA revealed calcium score 173.8 with RCA revealing 95% lesion in the midportion and normal left circumflex and LAD.  She was set up for urgent cardiac catheterization and that revealed lesion to be more like 99%.  He is being admitted for overnight observation after PCI and stenting of the mid RCA.   Review of Systems: No orthopnea PND or leg swelling   Past Medical History:  Diagnosis Date   Anxiety    Arthritis    Cataracts, bilateral    Change in bowel habits 01/02/2023   Chronic hepatitis C (HCC) 04/25/2010   Depression    History of hepatitis    History of nephrolithiasis 03/28/2016   Insomnia    Kidney stones    Tinnitus     Medications Prior to Admission  Medication Sig Dispense Refill   acetaminophen (TYLENOL) 500 MG tablet Take 1,000 mg by mouth in the morning.     butalbital-acetaminophen-caffeine (FIORICET) 50-325-40 MG tablet Take 1 tablet by mouth every 6 (six) hours as needed for migraine.     cetirizine (ZYRTEC) 10 MG tablet Take 10 mg by mouth daily.     clonazePAM (KLONOPIN) 1 MG tablet Take 1-2 mg by mouth at bedtime.     cloNIDine (CATAPRES) 0.2 MG tablet Take 0.2 mg by mouth daily as needed (hot flashes).     cyclobenzaprine (FLEXERIL) 5 MG tablet Take 1 tablet (5 mg total) by mouth 3 (three) times daily as needed. 90 tablet 2   DAYVIGO 10 MG TABS Take 10 mg by mouth at bedtime.     docusate sodium (COLACE) 100  MG capsule Take 100 mg by mouth daily.     gabapentin (NEURONTIN) 100 MG capsule Take 1 capsule (100 mg total) by mouth at bedtime. (Patient taking differently: Take 100-200 mg by mouth at bedtime.) 30 capsule 0   Lurasidone HCl 60 MG TABS Take 60 mg by mouth daily.     methylphenidate (RITALIN) 10 MG tablet Take 10 mg by mouth in the morning.     rosuvastatin (CRESTOR) 5 MG tablet Take 1 tablet (5 mg total) by mouth daily. 30 tablet 11   Fezolinetant (VEOZAH) 45 MG TABS Take 1 tablet (45 mg total) by mouth daily. (Patient not taking: Reported on 08/13/2023) 30 tablet 4   metoprolol tartrate (LOPRESSOR) 25 MG tablet Take 1 tab night before and 2 tab morning of test 90 minutes before arrival (Patient not taking: Reported on 08/13/2023) 3 tablet 0   OVER THE COUNTER MEDICATION Take 1 capsule by mouth daily. Nutrafol (Patient not taking: Reported on 08/16/2023)        [START ON 08/17/2023] aspirin  81 mg Oral Pre-Cath   [START ON 08/17/2023] aspirin  81 mg Oral Pre-Cath   sodium chloride flush  3 mL Intravenous Q12H    Infusions:  sodium chloride     sodium chloride     [START ON 08/17/2023] sodium chloride     Followed  by   Melene Muller ON 08/17/2023] sodium chloride     [START ON 08/17/2023] sodium chloride 3 mL/kg/hr (08/16/23 1216)   Followed by   Melene Muller ON 08/17/2023] sodium chloride 1 mL/kg/hr (08/16/23 1319)   bivalirudin (ANGIOMAX) 250 mg in sodium chloride 0.9 % 50 mL (5 mg/mL) infusion 1.75 mg/kg/hr (08/16/23 1347)    Allergies  Allergen Reactions   Amoxicillin-Pot Clavulanate Nausea And Vomiting   Bee Venom Anaphylaxis    As a child   Mirabegron Cough, Other (See Comments) and Shortness Of Breath    SOB, sore throat, cough, tremor, upset stomachs   Aspirin Other (See Comments)    Abdominal Pain   Ciprofloxacin Nausea And Vomiting   Tramadol Nausea And Vomiting   Vicodin [Hydrocodone-Acetaminophen] Itching    Social History   Socioeconomic History   Marital status: Single     Spouse name: Not on file   Number of children: 0   Years of education: Not on file   Highest education level: Not on file  Occupational History   Not on file  Tobacco Use   Smoking status: Every Day    Current packs/day: 0.25    Average packs/day: 0.3 packs/day for 40.0 years (10.0 ttl pk-yrs)    Types: Cigarettes   Smokeless tobacco: Never   Tobacco comments:    Pt smokes 3 cigarettes a day   Vaping Use   Vaping status: Every Day   Substances: Nicotine, Flavoring  Substance and Sexual Activity   Alcohol use: No   Drug use: Not Currently   Sexual activity: Not Currently    Birth control/protection: Post-menopausal  Other Topics Concern   Not on file  Social History Narrative   Not on file   Social Drivers of Health   Financial Resource Strain: Low Risk  (10/18/2021)   Received from [facility]   Overall Financial Resource Strain (CARDIA)    Difficulty of Paying Living Expenses: Not hard at all  Food Insecurity: No Food Insecurity (10/18/2021)   Received from [facility]   Hunger Vital Sign    Worried About Running Out of Food in the Last Year: Never true    Ran Out of Food in the Last Year: Never true  Transportation Needs: No Transportation Needs (10/18/2021)   Received from [facility] - Transportation    In the past 12 months, has lack of transportation kept you from medical appointments or from getting medications?: No    Lack of Transportation (Non-Medical): No  Physical Activity: Inactive (03/08/2021)   Received from [facility]   Exercise Vital Sign    Days of Exercise per Week: 0 days    Minutes of Exercise per Session: 0 min  Stress: Stress Concern Present (03/15/2021)   Received from [facility] - Occupational Stress Questionnaire    Feeling of Stress : Very much  Social Connections: Socially Isolated (03/08/2021)    Received from [facility]   Social Connection and Isolation Panel [NHANES]    Frequency of Communication with Friends and Family: Never    Frequency of Social Gatherings with Friends and Family: Never    Attends Religious Services: 1 to 4 times per year    Active Member of [employer] or Organizations: No    Attends Banker Meetings: Never    Marital Status: Divorced  Catering manager Violence: Not on file  Family History  Problem Relation Age of Onset   Dementia Mother    Alzheimer's disease Mother    Parkinson's disease Mother    Migraines Mother    Other Mother        Back issues   Other Father        Back Issues   Dementia Father    Arthritis Maternal Grandmother    Migraines Maternal Grandfather     PHYSICAL EXAM: Vitals:   08/16/23 1214 08/16/23 1314  BP: 126/85   Pulse: 73   Resp: 15   Temp: 98.4 F (36.9 C)   SpO2: 98% 100%    No intake or output data in the 24 hours ending 08/16/23 1354  General:  Well appearing. No respiratory difficulty HEENT: normal Neck: supple. no JVD. Carotids 2+ bilat; no bruits. No lymphadenopathy or thryomegaly appreciated. Cor: PMI nondisplaced. Regular rate & rhythm. No rubs, gallops or murmurs. Lungs: clear Abdomen: soft, nontender, nondistended. No hepatosplenomegaly. No bruits or masses. Good bowel sounds. Extremities: no cyanosis, clubbing, rash, edema Neuro: alert & oriented x 3, cranial nerves grossly intact. moves all 4 extremities w/o difficulty. Affect pleasant.  ECG: Normal sinus rhythm no acute  No results found for this or any previous visit (from the past 24 hours). CARDIAC CATHETERIZATION Result Date: 08/16/2023   Prox RCA lesion is 99% stenosed.   LV end diastolic pressure is normal.   The left ventricular ejection fraction is greater than 65% by visual estimate.   There is no aortic valve stenosis.   Recommend dual antiplatelet therapy. Had 99% lesion mid RCA needing PCI/Stent, normal  EF on echo.     ASSESSMENT AND PLAN: Unstable angina with 99% mid RCA lesion and normal left circumflex and LAD and normal LVEF and normal LVEDP.  Patient will have PCI and stenting today and then be observed for overnight and can go home in the morning.  Patient will follow-up with me as usual as she has already an appointment.  Brooke Jenkins [place] Flakes

## 2023-08-16 NOTE — Op Note (Signed)
[facility]  Percutaneous Study/Intervention Procedural Note   Date of Surgery: 08/16/2023  Surgeon(s):Akeiba Axelson    Assistants:none  Pre-operative Diagnosis: Pseudoaneurysm right femoral artery after cardiac catheterization  Post-operative diagnosis:  Same  Procedure(s) Performed:             1.  Ultrasound guidance for vascular access left femoral artery             2.  Catheter placement into right common femoral artery from left femoral approach             3.  Aortogram and selective right lower extremity angiogram             4.  Covered stent placement to the right common femoral artery with 8 mm diameter by 5 cm length Viabahn stent             5.  StarClose closure device left femoral artery  EBL: 10 cc  Contrast: 40 cc  Fluoro Time: 2.1 minutes  Moderate Conscious Sedation Time: approximately 24 minutes using 2 mg of Versed               Indications:  Patient is a 68 y.o.female with a right femoral pseudoaneurysm after cardiac catheterization earlier today demonstrated with clinical appearance as well as CT scan.The patient is brought in for angiography urgently for further evaluation and potential treatment.  Due to the risk of life-threatening bleeding, repair is indicated.  Risks and benefits are discussed and informed consent is obtained.   Procedure:  The patient was identified and appropriate procedural time out was performed.  The patient was then placed supine on the table and prepped and draped in the usual sterile fashion. Moderate conscious sedation was administered during a face to face encounter with the patient throughout the procedure with my supervision of the RN administering medicines and monitoring the patient's vital signs, pulse oximetry, telemetry and mental status throughout from the start of the procedure until the patient was taken to the recovery room. Ultrasound was used to evaluate the left common femoral artery.  It was  patent .  A digital ultrasound image was acquired.  A Seldinger needle was used to access the left common femoral artery under direct ultrasound guidance and a permanent image was performed.  A 0.035 J wire was advanced without resistance and a 5Fr sheath was placed.  Pigtail catheter was placed into the aorta and an AP aortogram was performed. This demonstrated normal renal arteries and normal aorta and iliac segments without significant stenosis. I then crossed the aortic bifurcation and advanced to the right femoral head. Selective right lower extremity angiogram was then performed. This demonstrated no significant stenosis within the common femoral artery, profunda femoris artery, or superficial femoral artery.  There were 2 primary profunda femoris branches with the more superior of the two being the smaller of the two.  There was pseudoaneurysm demonstrated in the mid to distal right common femoral artery about a centimeter above the more superior profunda femoris branch.  This was best seen on a steep oblique projection. It was felt that it was in the patient's best interest to proceed with intervention after these images to avoid a second procedure and a larger amount of contrast and fluoroscopy based off of the findings from the initial angiogram. The patient was systemically heparinized and a 7 French 23 cm sheath was then placed over the Terumo Advantage wire. I then used a Kumpe catheter and the advantage  wire to cross the pseudoaneurysm get down into the superficial femoral artery with a catheter.  After intraluminal flow was confirmed, a V18 wire was placed and the catheter was removed.  Imaging through the sheath was performed in magnified oblique projections so that we can deploy the stent just at the top of the profunda femoris artery to avoid occluding any significant runoff but allow full coverage of the pseudoaneurysm.  An 8 mm diameter by 5 cm length Viabahn stent was deployed in the right  common femoral artery.  Completion imaging following stent placement showed successful exclusion of the pseudoaneurysm.  Both main profunda femoris artery branches were widely patent.  The SFA remained patent.  There was no significant residual stenosis in the common femoral artery after stent placement with successful exclusion of the pseudoaneurysm. I elected to terminate the procedure. The sheath was removed and StarClose closure device was deployed in the left femoral artery with excellent hemostatic result. The patient was taken to the recovery room in stable condition having tolerated the procedure well.  Findings:               Aortogram:  This demonstrated normal renal arteries and normal aorta and iliac segments without significant stenosis.             Right Lower Extremity:   This demonstrated no significant stenosis within the common femoral artery, profunda femoris artery, or superficial femoral artery.  There were 2 primary profunda femoris branches with the more superior of the two being the smaller of the two.  There was pseudoaneurysm demonstrated in the mid to distal right common femoral artery about a centimeter above the more superior profunda femoris branch.  This was best seen on a steep oblique projection   Disposition: Patient was taken to the recovery room in stable condition having tolerated the procedure well.  Complications: None  Festus Barren 08/16/2023 8:57 PM   This note was created with Dragon Medical transcription system. Any errors in dictation are purely unintentional.

## 2023-08-16 NOTE — Progress Notes (Signed)
Patient left the unit back to OR.

## 2023-08-17 ENCOUNTER — Encounter: Payer: Self-pay | Admitting: Cardiovascular Disease

## 2023-08-17 ENCOUNTER — Other Ambulatory Visit (HOSPITAL_COMMUNITY): Payer: Self-pay

## 2023-08-17 DIAGNOSIS — I200 Unstable angina: Secondary | ICD-10-CM | POA: Diagnosis not present

## 2023-08-17 LAB — URINALYSIS, COMPLETE (UACMP) WITH MICROSCOPIC
Bilirubin Urine: NEGATIVE
Glucose, UA: NEGATIVE mg/dL
Ketones, ur: NEGATIVE mg/dL
Leukocytes,Ua: NEGATIVE
Nitrite: NEGATIVE
Protein, ur: 100 mg/dL — AB
RBC / HPF: >50 RBC/hpf (ref 0–5)
Specific Gravity, Urine: 1.04 — ABNORMAL HIGH (ref 1.005–1.030)
Squamous Epithelial / HPF: 0 /[HPF] (ref 0–5)
WBC, UA: >50 WBC/hpf (ref 0–5)
pH: 6 (ref 5.0–8.0)

## 2023-08-17 LAB — CBC
HCT: 34.5 % — ABNORMAL LOW (ref 36.0–46.0)
Hemoglobin: 11.7 g/dL — ABNORMAL LOW (ref 12.0–15.0)
MCH: 33.2 pg (ref 26.0–34.0)
MCHC: 33.9 g/dL (ref 30.0–36.0)
MCV: 98 fL (ref 80.0–100.0)
Platelets: 221 10*3/uL (ref 150–400)
RBC: 3.52 MIL/uL — ABNORMAL LOW (ref 3.87–5.11)
RDW: 12.4 % (ref 11.5–15.5)
WBC: 6.9 10*3/uL (ref 4.0–10.5)
nRBC: 0 % (ref 0.0–0.2)

## 2023-08-17 LAB — BASIC METABOLIC PANEL
Anion gap: 9 (ref 5–15)
BUN: 13 mg/dL (ref 8–23)
CO2: 25 mmol/L (ref 22–32)
Calcium: 8.6 mg/dL — ABNORMAL LOW (ref 8.9–10.3)
Chloride: 104 mmol/L (ref 98–111)
Creatinine, Ser: 0.56 mg/dL (ref 0.44–1.00)
GFR, Estimated: >60 mL/min (ref >60)
Glucose, Bld: 108 mg/dL — ABNORMAL HIGH (ref 70–99)
Potassium: 3.8 mmol/L (ref 3.5–5.1)
Sodium: 138 mmol/L (ref 135–145)

## 2023-08-17 LAB — LIPID PANEL
Cholesterol: 133 mg/dL (ref 0–200)
HDL: 47 mg/dL (ref >40)
LDL Cholesterol: 64 mg/dL (ref 0–99)
Total CHOL/HDL Ratio: 2.8 {ratio}
Triglycerides: 112 mg/dL (ref <150)
VLDL: 22 mg/dL (ref 0–40)

## 2023-08-17 LAB — GLUCOSE, CAPILLARY: Glucose-Capillary: 185 mg/dL — ABNORMAL HIGH (ref 70–99)

## 2023-08-17 MED ORDER — CLONAZEPAM 1 MG PO TABS: 1.0000 mg | ORAL_TABLET | Freq: Two times a day (BID) | ORAL | Status: DC | PRN

## 2023-08-17 MED ORDER — POLYETHYLENE GLYCOL 3350 17 G PO PACK
17.0000 g | PACK | Freq: Two times a day (BID) | ORAL | Status: DC
  Administered 2023-08-17 – 2023-08-18 (×2): 17 g via ORAL
  Filled 2023-08-17 (×2): qty 1

## 2023-08-17 NOTE — TOC Benefit Eligibility Note (Signed)
Pharmacy Patient Advocate Encounter  Insurance verification completed.    The patient is insured through  [employer] Part D . Patient has Medicare and is not eligible for a copay card, but may be able to apply for patient assistance or Medicare RX Payment Plan (Patient Must reach out to their plan, if eligible for payment plan), if available.    Ran test claim for Brilinta 90mg  and the current 30 day co-pay is $0.   This test claim was processed through [employer]- copay amounts may vary at other pharmacies due to [employer], or as the patient moves through the different stages of their insurance plan.

## 2023-08-17 NOTE — Progress Notes (Signed)
SUBJECTIVE: Patient is sitting up eating breakfast and denies any chest pain.  Right groin little bit of soreness but no pain like last night.   Vitals:   08/17/23 0052 08/17/23 0200 08/17/23 0400 08/17/23 0829  BP: 110/70 120/71 99/65 109/68  Pulse: 80 87 88 95  Resp:      Temp:  98.5 F (36.9 C)  98.6 F (37 C)  TempSrc:  Oral  Oral  SpO2: 96% 97% 95% 96%  Weight:      Height:       No intake or output data in the 24 hours ending 08/17/23 1010  LABS: Basic Metabolic Panel: Recent Labs    08/17/23 0545  NA 138  K 3.8  CL 104  CO2 25  GLUCOSE 108*  BUN 13  CREATININE 0.56  CALCIUM 8.6*   Liver Function Tests: No results for input(s): "AST", "ALT", "ALKPHOS", "BILITOT", "PROT", "ALBUMIN" in the last 72 hours. No results for input(s): "LIPASE", "AMYLASE" in the last 72 hours. CBC: Recent Labs    08/16/23 1842 08/17/23 0545  WBC 8.4 6.9  HGB 13.1 11.7*  HCT 40.7 34.5*  MCV 102.8* 98.0  PLT 214 221   Cardiac Enzymes: No results for input(s): "CKTOTAL", "CKMB", "CKMBINDEX", "TROPONINI" in the last 72 hours. BNP: Invalid input(s): "POCBNP" D-Dimer: No results for input(s): "DDIMER" in the last 72 hours. Hemoglobin A1C: No results for input(s): "HGBA1C" in the last 72 hours. Fasting Lipid Panel: Recent Labs    08/17/23 0545  CHOL 133  HDL 47  LDLCALC 64  TRIG 112  CHOLHDL 2.8   Thyroid Function Tests: No results for input(s): "TSH", "T4TOTAL", "T3FREE", "THYROIDAB" in the last 72 hours.  Invalid input(s): "FREET3" Anemia Panel: No results for input(s): "VITAMINB12", "FOLATE", "FERRITIN", "TIBC", "IRON", "RETICCTPCT" in the last 72 hours.   PHYSICAL EXAM General: Well developed, well nourished, in no acute distress HEENT:  Normocephalic and atramatic Neck:  No JVD.  Lungs: Clear bilaterally to auscultation and percussion. Heart: HRRR . Normal S1 and S2 without gallops or murmurs.  Abdomen: Bowel sounds are positive, abdomen soft and non-tender   Msk:  Back normal, normal gait. Normal strength and tone for age. Extremities: No clubbing, cyanosis or edema.   Neuro: Alert and oriented X 3. Psych:  Good affect, responds appropriately  TELEMETRY: Sinus rhythm  ASSESSMENT AND PLAN: Unstable angina status post PCI and stenting of mid RCA from 99% to 0%.  Patient developed post PCI and cardiac cath pseudoaneurysm in the right groin which was stented by Dr. Wyn Quaker.  Both right and left groin have no hematoma and there is no signs bruit.  Patient can with assistance walk today and cardiac point of view can be discharged with follow-up in the office as scheduled on February 3.  Continue current medicines.   ICD-10-CM   1. Other chest pain  R07.89 CARDIAC CATHETERIZATION    CARDIAC CATHETERIZATION    CARDIAC CATHETERIZATION    CARDIAC CATHETERIZATION    DISCONTINUED: sodium chloride flush (NS) 0.9 % injection 3 mL    DISCONTINUED: 0.9% sodium chloride infusion    DISCONTINUED: 0.9% sodium chloride infusion    DISCONTINUED: sodium chloride flush (NS) 0.9 % injection 3 mL    DISCONTINUED: 0.9 %  sodium chloride infusion    DISCONTINUED: aspirin chewable tablet 81 mg    2. SOB (shortness of breath)  R06.02 CARDIAC CATHETERIZATION    CARDIAC CATHETERIZATION    CARDIAC CATHETERIZATION    CARDIAC CATHETERIZATION  3. Tobacco use disorder  F17.200 CARDIAC CATHETERIZATION    CARDIAC CATHETERIZATION    CARDIAC CATHETERIZATION    CARDIAC CATHETERIZATION    4. Chest pain due to myocardial ischemia, unspecified ischemic chest pain type  I25.9 CARDIAC CATHETERIZATION    CARDIAC CATHETERIZATION    CARDIAC CATHETERIZATION    CARDIAC CATHETERIZATION    5. Mixed hyperlipidemia  E78.2 CARDIAC CATHETERIZATION    CARDIAC CATHETERIZATION    CARDIAC CATHETERIZATION    CARDIAC CATHETERIZATION    6. Unstable angina (HCC)  I20.0 CARDIAC CATHETERIZATION    CARDIAC CATHETERIZATION    CARDIAC CATHETERIZATION    CARDIAC CATHETERIZATION    7. Atypical  chest pain  R07.89 DISCONTINUED: sodium chloride flush (NS) 0.9 % injection 3 mL    DISCONTINUED: 0.9% sodium chloride infusion    DISCONTINUED: 0.9% sodium chloride infusion    DISCONTINUED: sodium chloride flush (NS) 0.9 % injection 3 mL    DISCONTINUED: 0.9 %  sodium chloride infusion    DISCONTINUED: aspirin chewable tablet 81 mg    8. Abnormal nuclear stress test  R94.39 DISCONTINUED: sodium chloride flush (NS) 0.9 % injection 3 mL    DISCONTINUED: 0.9% sodium chloride infusion    DISCONTINUED: 0.9% sodium chloride infusion    DISCONTINUED: sodium chloride flush (NS) 0.9 % injection 3 mL    DISCONTINUED: 0.9 %  sodium chloride infusion    DISCONTINUED: aspirin chewable tablet 81 mg    DISCONTINUED: 0.9% sodium chloride infusion    DISCONTINUED: 0.9% sodium chloride infusion    DISCONTINUED: sodium chloride flush (NS) 0.9 % injection 3 mL    DISCONTINUED: 0.9 %  sodium chloride infusion    DISCONTINUED: aspirin chewable tablet 81 mg      Principal Problem:   Unstable angina (HCC) Active Problems:   ADD (attention deficit disorder)   Anxiety disorder   Chronic idiopathic constipation   Depression, major, recurrent, in partial remission (HCC)   Tobacco use disorder   Insomnia   Other chest pain   SOB (shortness of breath)   Chest pain due to myocardial ischemia   Mixed hyperlipidemia   S/P drug eluting coronary stent placement   Tobacco use    Adrian Blackwater, MD, [facility] 08/17/2023 10:10 AM

## 2023-08-17 NOTE — Plan of Care (Signed)
  Problem: Education: Goal: Understanding of CV disease, CV risk reduction, and recovery process will improve Outcome: Progressing Goal: Individualized Educational Video(s) Outcome: Progressing   Problem: Activity: Goal: Ability to return to baseline activity level will improve Outcome: Progressing   Problem: Cardiovascular: Goal: Ability to achieve and maintain adequate cardiovascular perfusion will improve Outcome: Progressing Goal: Vascular access site(s) Level 0-1 will be maintained Outcome: Progressing   Problem: Cardiovascular: Goal: Vascular access site(s) Level 0-1 will be maintained Outcome: Progressing

## 2023-08-17 NOTE — Progress Notes (Signed)
Brief follow-up note post PCI  Patient unfortunately developed a complication of a pseudoaneurysm after initial groin stick CT of the abdomen did not identify a retroperitoneal bleed but found hematoma tracking [place] consistent with pseudoaneurysm Graciously Dr. Wyn Quaker was able to correct the problem with a covered stent patient appears to be doing reasonably well hopefully should be ready for discharge within 23 hours Patient should maintain aspirin and Brilinta indefinitely for 6 months Cardiology care has been transferred back to Dr. Jule Economy

## 2023-08-17 NOTE — Progress Notes (Signed)
  PROGRESS NOTE    Brooke Jenkins  NWG:[id number] DOB: 1956-06-13 DOA: 08/16/2023 PCP: Miki Kins, FNP  258A/258A-AA  LOS: 1 day   Brief hospital course:   Assessment & Plan: Brooke Jenkins is a 68 y.o. female with medical history significant of hyperlipidemia, arthritis, depression, chronic hepatitis C, who presents to the hospital for scheduled left heart cath.  Post-cath, pt was admitted for observation and subsequently found to have pseudoaneurysm at the cath site.   * Unstable angina (HCC) CAD  status post PCI and stenting of mid RCA on 1/30 --cont ASA and Brilinta --cont statin --outpatient f/u with Dr. Welton Flakes on February 3   Right femoral artery pseudoaneurysm  S/p repair with covered stent placement on 1/30 --Follow up in 6-8 weeks with vascular surgery with ultrasound of right groin to assess pseudoaneurysm.   Tobacco use - Encouraged that she completely cease all nicotine use, including vaping  Mixed hyperlipidemia - cont statin   Insomnia - Continue home Dayvigo.  PDMP reviewed and appropriate  Depression, major, recurrent, in partial remission (HCC) - Continue home lurasidone  Chronic idiopathic constipation Patient stated that she continues to smoke especially in the mornings with her coffee to help her have a bowel movement. --miralax BID scheduled  Anxiety disorder - Continue home Klonopin as PRN.  PDMP reviewed and appropriate  ADD (attention deficit disorder) - Hold home Ritalin   DVT prophylaxis: SCD/Compression stockings Code Status: Full code  Family Communication:  Level of care: Progressive Dispo:   The patient is from: home Anticipated d/c is to: home Anticipated d/c date is: tomorrow   Subjective and Interval History:  Pt reported pain with initiation of urine stream.  Did have uncomfortable and unsuccessful Foley cath placement last night.  At baseline, pt said she has to strain to void.    Reported soreness over right  groin.   Objective: Vitals:   08/17/23 0052 08/17/23 0200 08/17/23 0400 08/17/23 0829  BP: 110/70 120/71 99/65 109/68  Pulse: 80 87 88 95  Resp:      Temp:  98.5 F (36.9 C)  98.6 F (37 C)  TempSrc:  Oral  Oral  SpO2: 96% 97% 95% 96%  Weight:      Height:       No intake or output data in the 24 hours ending 08/17/23 1913 Filed Weights   08/16/23 1214  Weight: 45.1 kg    Examination:   Constitutional: NAD, AAOx3 HEENT: conjunctivae and lids normal, EOMI CV: No cyanosis.   RESP: normal respiratory effort, on RA Neuro: II - XII grossly intact.   Psych: Normal mood and affect.  Appropriate judgement and reason   Data Reviewed: I have personally reviewed labs and imaging studies  Time spent: 50 minutes  Darlin Priestly, MD [facility] If 7PM-7AM, please contact night-coverage 08/17/2023, 7:13 PM

## 2023-08-17 NOTE — Care Management Obs Status (Signed)
MEDICARE OBSERVATION STATUS NOTIFICATION   Patient Details  Name: STEPHANNIE BRONER MRN: [id number] Date of Birth: [birth year]   Medicare Observation Status Notification Given:       Cristela Blue, CMA 08/17/2023, 2:58 PM

## 2023-08-17 NOTE — Progress Notes (Signed)
Progress Note    08/17/2023 11:58 AM 1 Day Post-Op  Subjective:  Brooke Jenkins is a 68 yo female now POD #1 from Aortogram with selective right lower extremity angiogram with covered stent placement to right common femoral artery to repair pseudoaneurysm post cardiac catheterization yesterday. Patient rests comfortably this afternoon in bed. She endorses soreness and erythema to right groin and left groin. No complaints overnight and vitals all remain stable.    Vitals:   08/17/23 0400 08/17/23 0829  BP: 99/65 109/68  Pulse: 88 95  Resp:    Temp:  98.6 F (37 C)  SpO2: 95% 96%   Physical Exam: Cardiac:  RRR, Normal S1, S2. No murmurs rubs clicks or gallops.  Lungs:  Normal respiratory effort. Clear to auscultation throughout. No rales, rhonchi or wheezing to note.  Incisions:  Left groin insertion site with dressing.  Clean dry and intact.  Extremities:  Bilateral lower extremities are warm to touch with palpable pulses.  Abdomen:  Positive bowel sounds throughout, soft, non tender and non distended.  Neurologic: AAOX3, answers questions and follows commands.   CBC    Component Value Date/Time   WBC 6.9 08/17/2023 0545   RBC 3.52 (L) 08/17/2023 0545   HGB 11.7 (L) 08/17/2023 0545   HGB 13.3 08/09/2023 1151   HCT 34.5 (L) 08/17/2023 0545   HCT 41.3 08/09/2023 1151   PLT 221 08/17/2023 0545   PLT 251 08/09/2023 1151   MCV 98.0 08/17/2023 0545   MCV 100 (H) 08/09/2023 1151   MCV 94 11/18/2012 2143   MCH 33.2 08/17/2023 0545   MCHC 33.9 08/17/2023 0545   RDW 12.4 08/17/2023 0545   RDW 11.5 (L) 08/09/2023 1151   RDW 13.2 11/18/2012 2143   LYMPHSABS 1.7 08/09/2023 1151   MONOABS 0.5 01/18/2021 1629   EOSABS 0.1 08/09/2023 1151   BASOSABS 0.1 08/09/2023 1151    BMET    Component Value Date/Time   NA 138 08/17/2023 0545   NA 143 08/09/2023 1152   NA 141 11/18/2012 2143   K 3.8 08/17/2023 0545   K 3.5 11/18/2012 2143   CL 104 08/17/2023 0545   CL 107 11/18/2012  2143   CO2 25 08/17/2023 0545   CO2 30 11/18/2012 2143   GLUCOSE 108 (H) 08/17/2023 0545   GLUCOSE 97 11/18/2012 2143   BUN 13 08/17/2023 0545   BUN 15 08/09/2023 1152   BUN 15 11/18/2012 2143   CREATININE 0.56 08/17/2023 0545   CREATININE 0.69 11/18/2012 2143   CALCIUM 8.6 (L) 08/17/2023 0545   CALCIUM 9.3 11/18/2012 2143   GFRNONAA >60 08/17/2023 0545   GFRNONAA >60 11/18/2012 2143   GFRAA >60 08/22/2019 1853   GFRAA >60 11/18/2012 2143    INR    Component Value Date/Time   INR 1.0 01/18/2021 1629    No intake or output data in the 24 hours ending 08/17/23 1158   Assessment/Plan:  68 y.o. female is s/p Aortogram with selective right lower extremity angiogram with covered stent placement to right common femoral artery to repair pseudoaneurysm post cardiac catheterization  1 Day Post-Op   PLAN Patient recovering as expected. No further complications to note. Pain medications as needed.  Okay to discharge when medically appropriate. Follow up in 6-8 weeks with vascular surgery with ultrasound of right groin to assess pseudoaneurysm.  Vascular Surgery to sign off case.   DVT prophylaxis:  ASA 81 mg daily and Brilinta 90 mg daily.   Marcie Bal [facility] 08/17/2023 11:58 AM

## 2023-08-17 NOTE — Progress Notes (Addendum)
Transition of Care [facility]) - Inpatient Brief Assessment   Patient Details  Name: Brooke Jenkins MRN: [id number] Date of Birth: Oct 03, 1955  Transition of Care [facility] CM/SW Contact:    Truddie Hidden, RN Phone Number: 08/17/2023, 12:46 PM   Clinical Narrative: TOC continuing to follow patient's progress throughout discharge planning.  2:44pm Patient given CODE 44. She is requesting explanation. She was advised a [facility] nurse will contact her.     Transition of Care Asessment: Insurance and Status: Insurance coverage has been reviewed Patient has primary care physician: Yes   Prior level of function:: Independent Prior/Current Home Services: No current home services Social Drivers of Health Review: SDOH reviewed no interventions necessary Readmission risk has been reviewed: Yes Transition of care needs: no transition of care needs at this time

## 2023-08-17 NOTE — Care Management CC44 (Signed)
Condition Code 44 Documentation Completed  Patient Details  Name: ANAIH BRANDER MRN: [id number] Date of Birth: [birth year]   Condition Code 44 given:  Yes Patient signature on Condition Code 44 notice:  Yes Documentation of 2 MD's agreement:  Yes Code 44 added to claim:  Yes    Truddie Hidden, RN 08/17/2023, 3:21 PM

## 2023-08-17 NOTE — Progress Notes (Signed)
   08/17/23 1200  Spiritual Encounters  Type of Visit Initial  Care provided to: Patient  Conversation partners present during encounter Nurse  Referral source Chaplain assessment  Reason for visit Routine spiritual support  OnCall Visit No  Spiritual Framework  Presenting Themes Courage hope and growth;Impactful experiences and emotions;Coping tools   Chaplain met with patient's sister in the elevator and she was concerned about her. Chaplain stopped by to talk to them both and to check on her progress. Patient shared that she wants to go home and misses her cats. Patient's sister shared different struggles that the patient has had medically. Chaplain offered them both words of hope and encouragement.

## 2023-08-18 DIAGNOSIS — F988 Other specified behavioral and emotional disorders with onset usually occurring in childhood and adolescence: Secondary | ICD-10-CM | POA: Diagnosis present

## 2023-08-18 DIAGNOSIS — Z87892 Personal history of anaphylaxis: Secondary | ICD-10-CM | POA: Diagnosis not present

## 2023-08-18 DIAGNOSIS — Z885 Allergy status to narcotic agent status: Secondary | ICD-10-CM | POA: Diagnosis not present

## 2023-08-18 DIAGNOSIS — K5904 Chronic idiopathic constipation: Secondary | ICD-10-CM | POA: Diagnosis present

## 2023-08-18 DIAGNOSIS — I724 Aneurysm of artery of lower extremity: Secondary | ICD-10-CM | POA: Diagnosis present

## 2023-08-18 DIAGNOSIS — Z886 Allergy status to analgesic agent status: Secondary | ICD-10-CM | POA: Diagnosis not present

## 2023-08-18 DIAGNOSIS — B182 Chronic viral hepatitis C: Secondary | ICD-10-CM | POA: Diagnosis present

## 2023-08-18 DIAGNOSIS — Z8261 Family history of arthritis: Secondary | ICD-10-CM | POA: Diagnosis not present

## 2023-08-18 DIAGNOSIS — Y840 Cardiac catheterization as the cause of abnormal reaction of the patient, or of later complication, without mention of misadventure at the time of the procedure: Secondary | ICD-10-CM | POA: Diagnosis present

## 2023-08-18 DIAGNOSIS — Z91030 Bee allergy status: Secondary | ICD-10-CM | POA: Diagnosis not present

## 2023-08-18 DIAGNOSIS — L7632 Postprocedural hematoma of skin and subcutaneous tissue following other procedure: Secondary | ICD-10-CM | POA: Diagnosis present

## 2023-08-18 DIAGNOSIS — Z7902 Long term (current) use of antithrombotics/antiplatelets: Secondary | ICD-10-CM | POA: Diagnosis not present

## 2023-08-18 DIAGNOSIS — F419 Anxiety disorder, unspecified: Secondary | ICD-10-CM | POA: Diagnosis present

## 2023-08-18 DIAGNOSIS — I25110 Atherosclerotic heart disease of native coronary artery with unstable angina pectoris: Secondary | ICD-10-CM | POA: Diagnosis present

## 2023-08-18 DIAGNOSIS — T81718A Complication of other artery following a procedure, not elsewhere classified, initial encounter: Secondary | ICD-10-CM | POA: Diagnosis present

## 2023-08-18 DIAGNOSIS — Z820 Family history of epilepsy and other diseases of the nervous system: Secondary | ICD-10-CM | POA: Diagnosis not present

## 2023-08-18 DIAGNOSIS — F17290 Nicotine dependence, other tobacco product, uncomplicated: Secondary | ICD-10-CM | POA: Diagnosis present

## 2023-08-18 DIAGNOSIS — F3341 Major depressive disorder, recurrent, in partial remission: Secondary | ICD-10-CM | POA: Diagnosis present

## 2023-08-18 DIAGNOSIS — Z79899 Other long term (current) drug therapy: Secondary | ICD-10-CM | POA: Diagnosis not present

## 2023-08-18 DIAGNOSIS — Z881 Allergy status to other antibiotic agents status: Secondary | ICD-10-CM | POA: Diagnosis not present

## 2023-08-18 DIAGNOSIS — I200 Unstable angina: Secondary | ICD-10-CM | POA: Diagnosis present

## 2023-08-18 DIAGNOSIS — F17210 Nicotine dependence, cigarettes, uncomplicated: Secondary | ICD-10-CM | POA: Diagnosis present

## 2023-08-18 DIAGNOSIS — E782 Mixed hyperlipidemia: Secondary | ICD-10-CM | POA: Diagnosis present

## 2023-08-18 DIAGNOSIS — Z87442 Personal history of urinary calculi: Secondary | ICD-10-CM | POA: Diagnosis not present

## 2023-08-18 DIAGNOSIS — G4700 Insomnia, unspecified: Secondary | ICD-10-CM | POA: Diagnosis present

## 2023-08-18 DIAGNOSIS — Z7982 Long term (current) use of aspirin: Secondary | ICD-10-CM | POA: Diagnosis not present

## 2023-08-18 LAB — BASIC METABOLIC PANEL
Anion gap: 10 (ref 5–15)
BUN: 12 mg/dL (ref 8–23)
CO2: 25 mmol/L (ref 22–32)
Calcium: 8.8 mg/dL — ABNORMAL LOW (ref 8.9–10.3)
Chloride: 106 mmol/L (ref 98–111)
Creatinine, Ser: 0.48 mg/dL (ref 0.44–1.00)
GFR, Estimated: >60 mL/min (ref >60)
Glucose, Bld: 109 mg/dL — ABNORMAL HIGH (ref 70–99)
Potassium: 3.9 mmol/L (ref 3.5–5.1)
Sodium: 141 mmol/L (ref 135–145)

## 2023-08-18 LAB — CBC
HCT: 34.2 % — ABNORMAL LOW (ref 36.0–46.0)
Hemoglobin: 11.4 g/dL — ABNORMAL LOW (ref 12.0–15.0)
MCH: 33.3 pg (ref 26.0–34.0)
MCHC: 33.3 g/dL (ref 30.0–36.0)
MCV: 100 fL (ref 80.0–100.0)
Platelets: 230 10*3/uL (ref 150–400)
RBC: 3.42 MIL/uL — ABNORMAL LOW (ref 3.87–5.11)
RDW: 12.3 % (ref 11.5–15.5)
WBC: 7 10*3/uL (ref 4.0–10.5)
nRBC: 0 % (ref 0.0–0.2)

## 2023-08-18 LAB — MAGNESIUM: Magnesium: 1.9 mg/dL (ref 1.7–2.4)

## 2023-08-18 MED ORDER — ASPIRIN 81 MG PO CHEW: 81.0000 mg | CHEWABLE_TABLET | Freq: Every day | ORAL | 11 refills | Status: DC

## 2023-08-18 MED ORDER — ROSUVASTATIN CALCIUM 20 MG PO TABS: 20.0000 mg | ORAL_TABLET | Freq: Every day | ORAL | 2 refills | Status: DC

## 2023-08-18 MED ORDER — TICAGRELOR 90 MG PO TABS: 90.0000 mg | ORAL_TABLET | Freq: Two times a day (BID) | ORAL | 11 refills | Status: AC

## 2023-08-18 NOTE — Progress Notes (Signed)
This RN provided discharge instructions and teaching to the patient. The patient verbalized and demonstrated understanding of the provided instructions. All outstanding questions resolved. R and L arm PIVs removed. Both cannulas intact. Pt tolerated well. All belongings packed and in tow.

## 2023-08-18 NOTE — Plan of Care (Signed)

## 2023-08-18 NOTE — Plan of Care (Signed)
  Problem: Education: Goal: Understanding of CV disease, CV risk reduction, and recovery process will improve 08/18/2023 1135 by Ansel Bong, RN Outcome: Adequate for Discharge 08/18/2023 1135 by Ansel Bong, RN Outcome: Progressing Goal: Individualized Educational Video(s) 08/18/2023 1135 by Ansel Bong, RN Outcome: Adequate for Discharge 08/18/2023 1135 by Ansel Bong, RN Outcome: Progressing   Problem: Activity: Goal: Ability to return to baseline activity level will improve 08/18/2023 1135 by Ansel Bong, RN Outcome: Adequate for Discharge 08/18/2023 1135 by Ansel Bong, RN Outcome: Progressing   Problem: Cardiovascular: Goal: Ability to achieve and maintain adequate cardiovascular perfusion will improve 08/18/2023 1135 by Ansel Bong, RN Outcome: Adequate for Discharge 08/18/2023 1135 by Ansel Bong, RN Outcome: Progressing Goal: Vascular access site(s) Level 0-1 will be maintained 08/18/2023 1135 by Ansel Bong, RN Outcome: Adequate for Discharge 08/18/2023 1135 by Ansel Bong, RN Outcome: Progressing

## 2023-08-18 NOTE — Discharge Summary (Incomplete)
Physician Discharge Summary   Brooke Jenkins  female DOB: 01-17-1956  UJW:[id number]  PCP: Miki Kins, FNP  Admit date: 08/16/2023 Discharge date: ***  Admitted From: *** Disposition:  {disposition:18248} Home Health: {yes/no:20286} CODE STATUS: {Palliative Code status:23503}  Discharge Instructions     AMB Referral to Cardiac Rehabilitation - Phase II   Complete by: As directed    Diagnosis: Coronary Stents   After initial evaluation and assessments completed: Virtual Based Care may be provided alone or in conjunction with Phase 2 Cardiac Rehab based on patient barriers.: Yes   Diet - low sodium heart healthy   Complete by: As directed    Discharge instructions   Complete by: As directed    Please take aspirin 81 mg and Brilinta together for 1 year without missing any dose.  Your Crestor has been increased from 5 mg to 20 mg daily.  For the right groin dressing, can remove after showering at home.  Then Bandaid over site for 3 days. [facility] Course:  For full details, please see H&P, progress notes, consult notes and ancillary notes.  Briefly,  ***  Unless noted above, medications under "STOP" list are ones pt was not taking PTA.  Discharge Diagnoses:  Principal Problem:   Unstable angina (HCC) Active Problems:   ADD (attention deficit disorder)   Anxiety disorder   Chronic idiopathic constipation   Depression, major, recurrent, in partial remission (HCC)   Tobacco use disorder   Insomnia   Other chest pain   SOB (shortness of breath)   Chest pain due to myocardial ischemia   Mixed hyperlipidemia   S/P drug eluting coronary stent placement   Tobacco use   Pseudoaneurysm of femoral artery [facility])     Discharge Instructions:  Allergies as of 08/18/2023       Reactions   Amoxicillin-pot Clavulanate Nausea And Vomiting   Bee Venom Anaphylaxis   As a child   Mirabegron Cough, Other (See Comments), Shortness Of Breath   SOB, sore  throat, cough, tremor, upset stomachs   Aspirin Other (See Comments)   Abdominal Pain   Ciprofloxacin Nausea And Vomiting   Tramadol Nausea And Vomiting   Vicodin [hydrocodone-acetaminophen] Itching        Medication List     STOP taking these medications    metoprolol tartrate 25 MG tablet Commonly known as: LOPRESSOR   OVER THE COUNTER MEDICATION   Veozah 45 MG Tabs Generic drug: Fezolinetant       TAKE these medications    acetaminophen 500 MG tablet Commonly known as: TYLENOL Take 1,000 mg by mouth in the morning.   aspirin 81 MG chewable tablet Chew 1 tablet (81 mg total) by mouth daily.   butalbital-acetaminophen-caffeine 50-325-40 MG tablet Commonly known as: FIORICET Take 1 tablet by mouth every 6 (six) hours as needed for migraine.   cetirizine 10 MG tablet Commonly known as: ZYRTEC Take 10 mg by mouth daily.   clonazePAM 1 MG tablet Commonly known as: KLONOPIN Take 1-2 mg by mouth at bedtime.   cloNIDine 0.2 MG tablet Commonly known as: CATAPRES Take 0.2 mg by mouth daily as needed (hot flashes).   cyclobenzaprine 5 MG tablet Commonly known as: FLEXERIL Take 1 tablet (5 mg total) by mouth 3 (three) times daily as needed.   DayVigo 10 MG Tabs Generic drug: Lemborexant Take 10 mg by mouth at bedtime.   docusate sodium 100 MG capsule Commonly known as: COLACE  Take 100 mg by mouth daily.   gabapentin 100 MG capsule Commonly known as: NEURONTIN Take 1 capsule (100 mg total) by mouth at bedtime. What changed: how much to take   Lurasidone HCl 60 MG Tabs Take 60 mg by mouth daily.   methylphenidate 10 MG tablet Commonly known as: RITALIN Take 10 mg by mouth in the morning.   rosuvastatin 20 MG tablet Commonly known as: CRESTOR Take 1 tablet (20 mg total) by mouth daily. Increased from 5 mg. What changed:  medication strength how much to take additional instructions   ticagrelor 90 MG Tabs tablet Commonly known as: BRILINTA Take 1  tablet (90 mg total) by mouth 2 (two) times daily.         Follow-up Information     Miki Kins, FNP Follow up in 1 week(s).   Specialty: Family Medicine Contact information: [address] [place] [address] [phone]                 Allergies  Allergen Reactions   Amoxicillin-Pot Clavulanate Nausea And Vomiting   Bee Venom Anaphylaxis    As a child   Mirabegron Cough, Other (See Comments) and Shortness Of Breath    SOB, sore throat, cough, tremor, upset stomachs   Aspirin Other (See Comments)    Abdominal Pain   Ciprofloxacin Nausea And Vomiting   Tramadol Nausea And Vomiting   Vicodin [Hydrocodone-Acetaminophen] Itching     The results of significant diagnostics from this hospitalization (including imaging, microbiology, ancillary and laboratory) are listed below for reference.   Consultations:   Procedures/Studies: CT Angio Abd/Pel w/ and/or w/o Result Date: 08/16/2023 CLINICAL DATA:  Recent cardiac catheterization with possible retroperitoneal hemorrhage EXAM: CTA ABDOMEN AND PELVIS WITHOUT AND WITH CONTRAST TECHNIQUE: Multidetector CT imaging of the abdomen and pelvis was performed using the standard protocol during bolus administration of intravenous contrast. Multiplanar reconstructed images and MIPs were obtained and reviewed to evaluate the vascular anatomy. RADIATION DOSE REDUCTION: This exam was performed according to the departmental dose-optimization program which includes automated exposure control, adjustment of the mA and/or kV according to patient size and/or use of iterative reconstruction technique. CONTRAST:  75mL OMNIPAQUE IOHEXOL 350 MG/ML SOLN COMPARISON:  10/25/2021 FINDINGS: VASCULAR Aorta: Abdominal aorta shows no aneurysmal dilatation or dissection. No significant atherosclerotic calcifications are noted. Celiac: Patent without evidence of aneurysm, dissection, vasculitis or significant stenosis. SMA: Patent without evidence of  aneurysm, dissection, vasculitis or significant stenosis. Renals: Both renal arteries are patent without evidence of aneurysm, dissection, vasculitis, fibromuscular dysplasia or significant stenosis. IMA: Patent without evidence of aneurysm, dissection, vasculitis or significant stenosis. Inflow: Iliacs are within normal limits. No focal stenosis is seen. The common femoral artery on the right demonstrates an area of extravasation along the anterior aspect likely related to the recent puncture site. This communicates with a small 1 cm collection of contrast material on the arterial phase images. This does not significantly enlarged on delayed venous phase images. This is highly suggestive of a small pseudoaneurysm. Ultrasound may be helpful for further evaluation. Veins: No specific venous abnormality is noted. Review of the MIP images confirms the above findings. NON-VASCULAR Lower chest: No acute abnormality. Hepatobiliary: No focal liver abnormality is seen. Status post cholecystectomy. No biliary dilatation. Pancreas: Unremarkable. No pancreatic ductal dilatation or surrounding inflammatory changes. Spleen: Normal in size without focal abnormality. Adrenals/Urinary Tract: Adrenal glands are within normal limits. Kidneys demonstrate a normal enhancement pattern bilaterally. The bladder is well distended with opacified and  unopacified contrast. Stomach/Bowel: Scattered diverticular change of the colon is noted without evidence of diverticulitis. No obstructive changes are seen. The appendix is well visualized and within normal limits. Small bowel and stomach are unremarkable. Lymphatic: No lymphadenopathy is noted. Reproductive: Uterus and bilateral adnexa are unremarkable. Other: In the right lower quadrant there are again changes consistent with extravasation from the common femoral artery with what appears to be a small pseudoaneurysm measuring 1 cm. Ultrasound may be helpful in this regard. Considerable  haziness of the surrounding soft tissues is noted consistent with localized hemorrhage. This extends superiorly along the right psoas muscle between the iliac bone and ascending colon. It also extends medially towards the bladder a definitive size is difficult to assess although at the level of the mid iliac wing it measures approximately 4.1 x 2.7 cm in greatest transverse and AP dimensions respectively and extends for several cm in craniocaudad projection. No free fluid is noted within the peritoneum. Musculoskeletal: No acute or significant osseous findings. IMPRESSION: VASCULAR Changes consistent with right common femoral artery pseudoaneurysm with evidence of retroperitoneal hemorrhage extending superiorly along the course of the right psoas muscle and right iliac bone as well as slightly inferiorly and medially towards the bladder. NON-VASCULAR Diverticulosis without diverticulitis. Critical report was not called at this time as the patient has already gone to the operating room for repair of the known pseudoaneurysm. Electronically Signed   By: Alcide Clever M.D.   On: 08/16/2023 22:59   PERIPHERAL VASCULAR CATHETERIZATION Result Date: 08/16/2023 See surgical note for result.  CARDIAC CATHETERIZATION Result Date: 08/16/2023   Prox RCA lesion is 99% stenosed.   A drug-eluting stent was successfully placed using a STENT ONYX FRONTIER 3.0X15.   Post intervention, there is a 0% residual stenosis.   Anticipated discharge date to be determined.   Recommend uninterrupted dual antiplatelet therapy with Aspirin 81mg  daily and Ticagrelor 90mg  twice daily for a minimum of 12 months (ACS-Class I recommendation). Conclusion 99% mid RCA TIMI-3 flow 6 [place] JR4 guide BMW wire Angiomax for anticoagulation Aspirin Brilinta Predilatation with a 3.0  x 15 mm trek to 10 atm Deployment of a 3.0 x 15 mm frontier Onyx to 18 atm DES stent Reduced from 99 down to 0% TIMI-3 flow maintained throughout Patient continued on  Angiomax at reduced rate for an additional 2 hours Mynx deployed to right femoral artery Patient was transferred to specials recovery Anticipate discharge within 23 hours   CARDIAC CATHETERIZATION Result Date: 08/16/2023   Prox RCA lesion is 99% stenosed.   LV end diastolic pressure is normal.   The left ventricular ejection fraction is greater than 65% by visual estimate.   There is no aortic valve stenosis.   Recommend dual antiplatelet therapy. Had 99% lesion mid RCA needing PCI/Stent, normal EF on echo.   CT CORONARY MORPH W/CTA COR W/SCORE W/CA W/CM &/OR WO/CM Result Date: 08/09/2023 Images from the original result were not included. Reason for Visit CCTA Computed Tomographic Angiography: Cardiac multidetector CT was performed paying particular attention to the coronary arteries for the diagnosis of: I48.91- Atrial Fibrillation, R07.9 - Chest Pain. DIAGNOSTIC DRUGS: Administered iohexol (Omnipaque) through an antecubital vein and images from the examination analyzed for the presecence and extent of coronary artery disease, using 3D image processing software.  100 mL of non-ionic contrast (Omnipaque) was used. TEST CONCLUSIONS Quality of study: Fair 1. Calcium score: 173.8 2. Right dominant system 3. Right Coronary Artery Mid 95% disease.  Left Circumflex Artery and Right  Coronary Artery normal. Advise Left Heart Cath as soon as possible. Adrian Blackwater, MD Interpreting Physician      Labs: BNP (last 3 results) No results for input(s): "BNP" in the last 8760 hours. Basic Metabolic Panel: Recent Labs  Lab 08/17/23 0545 08/18/23 0432  NA 138 141  K 3.8 3.9  CL 104 106  CO2 25 25  GLUCOSE 108* 109*  BUN 13 12  CREATININE 0.56 0.48  CALCIUM 8.6* 8.8*  MG  --  1.9   Liver Function Tests: No results for input(s): "AST", "ALT", "ALKPHOS", "BILITOT", "PROT", "ALBUMIN" in the last 168 hours. No results for input(s): "LIPASE", "AMYLASE" in the last 168 hours. No results for input(s):  "AMMONIA" in the last 168 hours. CBC: Recent Labs  Lab 08/16/23 1842 08/17/23 0545 08/18/23 0432  WBC 8.4 6.9 7.0  HGB 13.1 11.7* 11.4*  HCT 40.7 34.5* 34.2*  MCV 102.8* 98.0 100.0  PLT 214 221 230   Cardiac Enzymes: No results for input(s): "CKTOTAL", "CKMB", "CKMBINDEX", "TROPONINI" in the last 168 hours. BNP: Invalid input(s): "POCBNP" CBG: Recent Labs  Lab 08/17/23 1137  GLUCAP 185*   D-Dimer No results for input(s): "DDIMER" in the last 72 hours. Hgb A1c No results for input(s): "HGBA1C" in the last 72 hours. Lipid Profile Recent Labs    08/17/23 0545  CHOL 133  HDL 47  LDLCALC 64  TRIG 112  CHOLHDL 2.8   Thyroid function studies No results for input(s): "TSH", "T4TOTAL", "T3FREE", "THYROIDAB" in the last 72 hours.  Invalid input(s): "FREET3" Anemia work up No results for input(s): "VITAMINB12", "FOLATE", "FERRITIN", "TIBC", "IRON", "RETICCTPCT" in the last 72 hours. Urinalysis    Component Value Date/Time   COLORURINE YELLOW (A) 08/17/2023 1447   APPEARANCEUR CLOUDY (A) 08/17/2023 1447   APPEARANCEUR Clear 10/26/2021 1054   LABSPEC 1.040 (H) 08/17/2023 1447   LABSPEC 1.018 11/18/2012 2143   PHURINE 6.0 08/17/2023 1447   GLUCOSEU NEGATIVE 08/17/2023 1447   GLUCOSEU Negative 11/18/2012 2143   HGBUR LARGE (A) 08/17/2023 1447   BILIRUBINUR NEGATIVE 08/17/2023 1447   BILIRUBINUR Negative 10/26/2021 1054   BILIRUBINUR Negative 11/18/2012 2143   KETONESUR NEGATIVE 08/17/2023 1447   PROTEINUR 100 (A) 08/17/2023 1447   NITRITE NEGATIVE 08/17/2023 1447   LEUKOCYTESUR NEGATIVE 08/17/2023 1447   LEUKOCYTESUR 1+ 11/18/2012 2143   Sepsis Labs Recent Labs  Lab 08/16/23 1842 08/17/23 0545 08/18/23 0432  WBC 8.4 6.9 7.0   Microbiology No results found for this or any previous visit (from the past 240 hours).   Total time spend on discharging this patient, including the last patient exam, discussing the hospital stay, instructions for ongoing care as it  relates to all pertinent caregivers, as well as preparing the medical discharge records, prescriptions, and/or referrals as applicable, is *** minutes.    Darlin Priestly, MD  [facility] 08/18/2023, 9:20 AM

## 2023-08-18 NOTE — Progress Notes (Signed)
[facility]  SUBJECTIVE: Patient laying in bed, denies chest pain or shortness of breath, has residual numbness in right groin   Vitals:   08/17/23 2106 08/17/23 2319 08/18/23 0329 08/18/23 0817  BP: 116/72 121/78 114/64 123/66  Pulse: 91 79 85 92  Resp:  17 17 18   Temp: 98 F (36.7 C) 97.9 F (36.6 C) 98.6 F (37 C) 98.4 F (36.9 C)  TempSrc: Oral     SpO2: 98% 99% 98% 96%  Weight:      Height:         Intake/Output Summary (Last 24 hours) at 08/18/2023 1610 Last data filed at 08/17/2023 1900 Gross per 24 hour  Intake 120 ml  Output --  Net 120 ml      PHYSICAL EXAM  General: Well developed, well nourished, in no acute distress HEENT:  Normocephalic and atramatic Neck:  No JVD.  Lungs: Clear bilaterally to auscultation and percussion. Heart: HRRR . Normal S1 and S2 without gallops or murmurs.  Abdomen: Bowel sounds are positive, abdomen soft and non-tender  Msk:  Back normal, normal gait. Normal strength and tone for age. Extremities: No clubbing, cyanosis or edema.   Neuro: Alert and oriented X 3. Psych:  Good affect, responds appropriately   LABS: Basic Metabolic Panel: Recent Labs    08/17/23 0545 08/18/23 0432  NA 138 141  K 3.8 3.9  CL 104 106  CO2 25 25  GLUCOSE 108* 109*  BUN 13 12  CREATININE 0.56 0.48  CALCIUM 8.6* 8.8*  MG  --  1.9   Liver Function Tests: No results for input(s): "AST", "ALT", "ALKPHOS", "BILITOT", "PROT", "ALBUMIN" in the last 72 hours. No results for input(s): "LIPASE", "AMYLASE" in the last 72 hours. CBC: Recent Labs    08/17/23 0545 08/18/23 0432  WBC 6.9 7.0  HGB 11.7* 11.4*  HCT 34.5* 34.2*  MCV 98.0 100.0  PLT 221 230   Cardiac Enzymes: No results for input(s): "CKTOTAL", "CKMB", "CKMBINDEX", "TROPONINI" in the last 72 hours. BNP: Invalid input(s): "POCBNP" D-Dimer: No results for input(s): "DDIMER" in the last 72 hours. Hemoglobin A1C: No results for input(s): "HGBA1C" in the last 72 hours. Fasting  Lipid Panel: Recent Labs    08/17/23 0545  CHOL 133  HDL 47  LDLCALC 64  TRIG 112  CHOLHDL 2.8   Thyroid Function Tests: No results for input(s): "TSH", "T4TOTAL", "T3FREE", "THYROIDAB" in the last 72 hours.  Invalid input(s): "FREET3" Anemia Panel: No results for input(s): "VITAMINB12", "FOLATE", "FERRITIN", "TIBC", "IRON", "RETICCTPCT" in the last 72 hours.  CT Angio Abd/Pel w/ and/or w/o Result Date: 08/16/2023 CLINICAL DATA:  Recent cardiac catheterization with possible retroperitoneal hemorrhage EXAM: CTA ABDOMEN AND PELVIS WITHOUT AND WITH CONTRAST TECHNIQUE: Multidetector CT imaging of the abdomen and pelvis was performed using the standard protocol during bolus administration of intravenous contrast. Multiplanar reconstructed images and MIPs were obtained and reviewed to evaluate the vascular anatomy. RADIATION DOSE REDUCTION: This exam was performed according to the departmental dose-optimization program which includes automated exposure control, adjustment of the mA and/or kV according to patient size and/or use of iterative reconstruction technique. CONTRAST:  75mL OMNIPAQUE IOHEXOL 350 MG/ML SOLN COMPARISON:  10/25/2021 FINDINGS: VASCULAR Aorta: Abdominal aorta shows no aneurysmal dilatation or dissection. No significant atherosclerotic calcifications are noted. Celiac: Patent without evidence of aneurysm, dissection, vasculitis or significant stenosis. SMA: Patent without evidence of aneurysm, dissection, vasculitis or significant stenosis. Renals: Both renal arteries are patent without evidence of aneurysm, dissection, vasculitis, fibromuscular dysplasia  or significant stenosis. IMA: Patent without evidence of aneurysm, dissection, vasculitis or significant stenosis. Inflow: Iliacs are within normal limits. No focal stenosis is seen. The common femoral artery on the right demonstrates an area of extravasation along the anterior aspect likely related to the recent puncture site. This  communicates with a small 1 cm collection of contrast material on the arterial phase images. This does not significantly enlarged on delayed venous phase images. This is highly suggestive of a small pseudoaneurysm. Ultrasound may be helpful for further evaluation. Veins: No specific venous abnormality is noted. Review of the MIP images confirms the above findings. NON-VASCULAR Lower chest: No acute abnormality. Hepatobiliary: No focal liver abnormality is seen. Status post cholecystectomy. No biliary dilatation. Pancreas: Unremarkable. No pancreatic ductal dilatation or surrounding inflammatory changes. Spleen: Normal in size without focal abnormality. Adrenals/Urinary Tract: Adrenal glands are within normal limits. Kidneys demonstrate a normal enhancement pattern bilaterally. The bladder is well distended with opacified and unopacified contrast. Stomach/Bowel: Scattered diverticular change of the colon is noted without evidence of diverticulitis. No obstructive changes are seen. The appendix is well visualized and within normal limits. Small bowel and stomach are unremarkable. Lymphatic: No lymphadenopathy is noted. Reproductive: Uterus and bilateral adnexa are unremarkable. Other: In the right lower quadrant there are again changes consistent with extravasation from the common femoral artery with what appears to be a small pseudoaneurysm measuring 1 cm. Ultrasound may be helpful in this regard. Considerable haziness of the surrounding soft tissues is noted consistent with localized hemorrhage. This extends superiorly along the right psoas muscle between the iliac bone and ascending colon. It also extends medially towards the bladder a definitive size is difficult to assess although at the level of the mid iliac wing it measures approximately 4.1 x 2.7 cm in greatest transverse and AP dimensions respectively and extends for several cm in craniocaudad projection. No free fluid is noted within the peritoneum.  Musculoskeletal: No acute or significant osseous findings. IMPRESSION: VASCULAR Changes consistent with right common femoral artery pseudoaneurysm with evidence of retroperitoneal hemorrhage extending superiorly along the course of the right psoas muscle and right iliac bone as well as slightly inferiorly and medially towards the bladder. NON-VASCULAR Diverticulosis without diverticulitis. Critical report was not called at this time as the patient has already gone to the operating room for repair of the known pseudoaneurysm. Electronically Signed   By: Alcide Clever M.D.   On: 08/16/2023 22:59   PERIPHERAL VASCULAR CATHETERIZATION Result Date: 08/16/2023 See surgical note for result.  CARDIAC CATHETERIZATION Result Date: 08/16/2023   Prox RCA lesion is 99% stenosed.   A drug-eluting stent was successfully placed using a STENT ONYX FRONTIER 3.0X15.   Post intervention, there is a 0% residual stenosis.   Anticipated discharge date to be determined.   Recommend uninterrupted dual antiplatelet therapy with Aspirin 81mg  daily and Ticagrelor 90mg  twice daily for a minimum of 12 months (ACS-Class I recommendation). Conclusion 99% mid RCA TIMI-3 flow 6 [place] JR4 guide BMW wire Angiomax for anticoagulation Aspirin Brilinta Predilatation with a 3.0  x 15 mm trek to 10 atm Deployment of a 3.0 x 15 mm frontier Onyx to 18 atm DES stent Reduced from 99 down to 0% TIMI-3 flow maintained throughout Patient continued on Angiomax at reduced rate for an additional 2 hours Mynx deployed to right femoral artery Patient was transferred to specials recovery Anticipate discharge within 23 hours   CARDIAC CATHETERIZATION Result Date: 08/16/2023   Prox RCA lesion is 99% stenosed.  LV end diastolic pressure is normal.   The left ventricular ejection fraction is greater than 65% by visual estimate.   There is no aortic valve stenosis.   Recommend dual antiplatelet therapy. Had 99% lesion mid RCA needing PCI/Stent, normal EF on echo.      Echo   TELEMETRY: Sinus rhythm:  ASSESSMENT AND PLAN:  Principal Problem:   Unstable angina (HCC) Active Problems:   ADD (attention deficit disorder)   Anxiety disorder   Chronic idiopathic constipation   Depression, major, recurrent, in partial remission (HCC)   Tobacco use disorder   Insomnia   Other chest pain   SOB (shortness of breath)   Chest pain due to myocardial ischemia   Mixed hyperlipidemia   S/P drug eluting coronary stent placement   Tobacco use   Pseudoaneurysm of femoral artery (HCC)    1.  CAD, status post 3.0 x 15 mm Onyx Frontier DES proximal RCA via right femoral access 08/16/2023, complicated by pseudoaneurysm 2.  Right femoral pseudoaneurysm repair with covered stent to right common femoral artery by Dr. Wyn Quaker 08/16/2023  Recommendations  1.  Agree with current therapy 2.  Continue dual antiplatelet therapy uninterrupted x 1 year 3.  Continue rosuvastatin 4.  Patient cleared from cardiology perspective for discharge home after evaluation by vascular surgery   Marcina Millard, MD, PhD, [facility] 08/18/2023 9:22 AM

## 2023-08-19 LAB — LIPOPROTEIN A (LPA): Lipoprotein (a): 115.2 nmol/L — ABNORMAL HIGH (ref <75.0)

## 2023-08-20 ENCOUNTER — Other Ambulatory Visit: Payer: Self-pay | Admitting: *Deleted

## 2023-08-20 ENCOUNTER — Ambulatory Visit: Payer: 59 | Admitting: Cardiovascular Disease

## 2023-08-21 ENCOUNTER — Ambulatory Visit: Payer: 59 | Admitting: Physical Therapy

## 2023-08-23 ENCOUNTER — Ambulatory Visit: Payer: 59 | Admitting: Cardiovascular Disease

## 2023-08-27 NOTE — Progress Notes (Deleted)
 08/29/2023 2:15 PM   Brooke Jenkins 10/21/1955 [id number]  Referring provider: Miki Kins, FNP [address] [place],  [place] [address]  Urological history: 1.  Nephrolithiasis -[facility] (2007)  -URS (2018)  -scheduled for ESWL (2023) - but spontaneously passed stone  2. OAB  -Contributing factors of age, GSM, smoking, anxiety, depression, ADD, pelvic floor dysfunction, degenerative disc disease and constipation  No chief complaint on file.  HPI: Brooke Jenkins is a 68 y.o. female who presents today for hematuria.  Previous records reviewed.   She called her cardiology's office on August 20, 2023 for the complaints of gross hematuria.  Her urinalysis was light brown cloudy, specific gravity 1.007, pH 6.5, trace protein, 3+ blood, 7 WBCs and 105 RBCs.  Urine culture grew out mixed urogenital flora.  She underwent cardiac cath and experienced a pseudoaneurysm in her right femoral artery with a retroperitoneal hemorrhage extending superiorly, close in the right psoas muscle on the right iliac bone as well as slightly inferiorly and medially toward the bladder.  She also had a Foley placed during her admission two weeks ago.    UA ***      PMH: Past Medical History:  Diagnosis Date   Anxiety    Arthritis    Cataracts, bilateral    Change in bowel habits 01/02/2023   Chronic hepatitis C (HCC) 04/25/2010   Depression    History of hepatitis    History of nephrolithiasis 03/28/2016   Insomnia    Kidney stones    Tinnitus     Surgical History: Past Surgical History:  Procedure Laterality Date   CHOLECYSTECTOMY     COLONOSCOPY WITH PROPOFOL N/A 05/19/2019   Procedure: COLONOSCOPY WITH PROPOFOL;  Surgeon: Toney Reil, MD;  Location: [facility] ENDOSCOPY;  Service: Gastroenterology;  Laterality: N/A;   CORONARY STENT INTERVENTION N/A 08/16/2023   Procedure: CORONARY STENT INTERVENTION;  Surgeon: Alwyn Pea, MD;  Location: [facility] INVASIVE CV LAB;   Service: Cardiovascular;  Laterality: N/A;   ESOPHAGOGASTRODUODENOSCOPY (EGD) WITH PROPOFOL N/A 05/19/2019   Procedure: ESOPHAGOGASTRODUODENOSCOPY (EGD) WITH PROPOFOL;  Surgeon: Toney Reil, MD;  Location: [facility] ENDOSCOPY;  Service: Gastroenterology;  Laterality: N/A;   EXTRACORPOREAL SHOCK WAVE LITHOTRIPSY Left 11/03/2021   Procedure: EXTRACORPOREAL SHOCK WAVE LITHOTRIPSY (ESWL);  Surgeon: Riki Altes, MD;  Location: [facility] ORS;  Service: Urology;  Laterality: Left;   EXTRACORPOREAL SHOCK WAVE LITHOTRIPSY Left 10/27/2021   Procedure: EXTRACORPOREAL SHOCK WAVE LITHOTRIPSY (ESWL);  Surgeon: Vanna Scotland, MD;  Location: [facility] ORS;  Service: Urology;  Laterality: Left;   HEMORROIDECTOMY     KIDNEY STONE SURGERY     LEFT HEART CATH AND CORONARY ANGIOGRAPHY Left 08/16/2023   Procedure: LEFT HEART CATH AND CORONARY ANGIOGRAPHY;  Surgeon: Laurier Nancy, MD;  Location: [facility] INVASIVE CV LAB;  Service: Cardiovascular;  Laterality: Left;   LOWER EXTREMITY ANGIOGRAPHY Right 08/16/2023   Procedure: Lower Extremity Angiography;  Surgeon: Annice Needy, MD;  Location: [facility] INVASIVE CV LAB;  Service: Cardiovascular;  Laterality: Right;   TEMPOROMANDIBULAR JOINT SURGERY      Home Medications:  Allergies as of 08/29/2023       Reactions   Amoxicillin-pot Clavulanate Nausea And Vomiting   Bee Venom Anaphylaxis   As a child   Mirabegron Cough, Other (See Comments), Shortness Of Breath   SOB, sore throat, cough, tremor, upset stomachs   Aspirin Other (See Comments)   Abdominal Pain   Ciprofloxacin Nausea And Vomiting   Tramadol Nausea And Vomiting  Vicodin [hydrocodone-acetaminophen] Itching        Medication List        Accurate as of August 27, 2023  2:15 PM. If you have any questions, ask your nurse or doctor.          acetaminophen 500 MG tablet Commonly known as: TYLENOL Take 1,000 mg by mouth in the morning.   aspirin 81 MG chewable tablet Chew 1 tablet (81 mg total) by  mouth daily.   butalbital-acetaminophen-caffeine 50-325-40 MG tablet Commonly known as: FIORICET Take 1 tablet by mouth every 6 (six) hours as needed for migraine.   cetirizine 10 MG tablet Commonly known as: ZYRTEC Take 10 mg by mouth daily.   clonazePAM 1 MG tablet Commonly known as: KLONOPIN Take 1-2 mg by mouth at bedtime.   cloNIDine 0.2 MG tablet Commonly known as: CATAPRES Take 0.2 mg by mouth daily as needed (hot flashes).   cyclobenzaprine 5 MG tablet Commonly known as: FLEXERIL Take 1 tablet (5 mg total) by mouth 3 (three) times daily as needed.   DayVigo 10 MG Tabs Generic drug: Lemborexant Take 10 mg by mouth at bedtime.   docusate sodium 100 MG capsule Commonly known as: COLACE Take 100 mg by mouth daily.   gabapentin 100 MG capsule Commonly known as: NEURONTIN Take 1 capsule (100 mg total) by mouth at bedtime. What changed: how much to take   Lurasidone HCl 60 MG Tabs Take 60 mg by mouth daily.   methylphenidate 10 MG tablet Commonly known as: RITALIN Take 10 mg by mouth in the morning.   rosuvastatin 20 MG tablet Commonly known as: CRESTOR Take 1 tablet (20 mg total) by mouth daily. Increased from 5 mg.   ticagrelor 90 MG Tabs tablet Commonly known as: BRILINTA Take 1 tablet (90 mg total) by mouth 2 (two) times daily.        Allergies:  Allergies  Allergen Reactions   Amoxicillin-Pot Clavulanate Nausea And Vomiting   Bee Venom Anaphylaxis    As a child   Mirabegron Cough, Other (See Comments) and Shortness Of Breath    SOB, sore throat, cough, tremor, upset stomachs   Aspirin Other (See Comments)    Abdominal Pain   Ciprofloxacin Nausea And Vomiting   Tramadol Nausea And Vomiting   Vicodin [Hydrocodone-Acetaminophen] Itching    Family History: Family History  Problem Relation Age of Onset   Dementia Mother    Alzheimer's disease Mother    Parkinson's disease Mother    Migraines Mother    Other Mother        Back issues    Other Father        Back Issues   Dementia Father    Arthritis Maternal Grandmother    Migraines Maternal Grandfather     Social History:  reports that she has been smoking cigarettes. She has a 10 pack-year smoking history. She has never used smokeless tobacco. She reports that she does not currently use drugs. She reports that she does not drink alcohol.  ROS: Pertinent ROS in HPI  Physical Exam: There were no vitals taken for this visit.  Constitutional:  Well nourished. Alert and oriented, No acute distress. HEENT: [place] AT, moist mucus membranes.  Trachea midline, no masses. Cardiovascular: No clubbing, cyanosis, or edema. Respiratory: Normal respiratory effort, no increased work of breathing. GU: No CVA tenderness.  No bladder fullness or masses.  Recession of labia minora, dry, pale vulvar vaginal mucosa and loss of mucosal ridges and folds.  Normal urethral meatus, no lesions, no prolapse, no discharge.   No urethral masses, tenderness and/or tenderness. No bladder fullness, tenderness or masses. *** vagina mucosa, *** estrogen effect, no discharge, no lesions, *** pelvic support, *** cystocele and *** rectocele noted.  No cervical motion tenderness.  Uterus is freely mobile and non-fixed.  No adnexal/parametria masses or tenderness noted.  Anus and perineum are without rashes or lesions.   ***  Neurologic: Grossly intact, no focal deficits, moving all 4 extremities. Psychiatric: Normal mood and affect.    Laboratory Data: Lab Results  Component Value Date   WBC 7.0 08/18/2023   HGB 11.4 (L) 08/18/2023   HCT 34.2 (L) 08/18/2023   MCV 100.0 08/18/2023   PLT 230 08/18/2023    Lab Results  Component Value Date   CREATININE 0.48 08/18/2023    Lab Results  Component Value Date   HGBA1C 5.8 (H) 06/07/2023    Lab Results  Component Value Date   TSH 2.360 06/07/2023       Component Value Date/Time   CHOL 133 08/17/2023 0545   CHOL 188 06/07/2023 1221   HDL 47  08/17/2023 0545   HDL 60 06/07/2023 1221   CHOLHDL 2.8 08/17/2023 0545   VLDL 22 08/17/2023 0545   LDLCALC 64 08/17/2023 0545   LDLCALC 110 (H) 06/07/2023 1221    Lab Results  Component Value Date   AST 24 08/09/2023   Lab Results  Component Value Date   ALT 28 08/09/2023   Urinalysis See EPIC and HPI  I have reviewed the labs.   Pertinent Imaging: N/A  Assessment & Plan:  ***  1. Gross hematuria -May still be the result of the pseudoaneurysm associated with the retroperitoneal hemorrhage and Foley catheter trauma - we discussed that there are a number of causes that can be associated with blood in the urine, such as stones, UTI's, damage to the urinary tract and/or cancer.   Sometimes, we do not find a cause or source of the hematuria.   -we discussed that new guidelines place individuals into risk categories of low, intermediate and high risk categories.  These factors are based on age, smoking history and degree of blood in urine.   -we discussed that at this time, they are in the high risk stratification  -we discussed that the recommended protocol for further work up are are CT urogram and cysto -we discussed that for a CT urogram a contrast material will be injected into a vein and that in rare instances, an allergic reaction can result and may even life threatening (1:100,000)  The patient denies any allergies to contrast, iodine and/or seafood and is not taking metformin. - we discussed that following the imaging study,  a cystoscopy is performed  -we discussed that a cystoscopy is a procedure that consists of passing a camera up their urethra after administering lidocaine to anesthetize and that after the procedure a minor amount of blood in the urine and/or burning which usually resolves in 24 to 48 hours may occur  -the patient had the opportunity to ask questions which were answered. Based upon this discussion, the patient is willing to proceed. Therefore, I've ordered:  a CT Urogram and cystoscopy  - The patient will return following all of the above for discussion of the results.  - UA *** - Urine culture      No follow-ups on file.  These notes generated with voice recognition software. I apologize for typographical errors.  Biagio Snelson, PA-C  [facility] [address]  [address] [place], [place] [address] [phone]

## 2023-08-29 ENCOUNTER — Ambulatory Visit: Payer: 59 | Admitting: Urology

## 2023-08-29 DIAGNOSIS — R310 Gross hematuria: Secondary | ICD-10-CM

## 2023-08-29 DIAGNOSIS — N3281 Overactive bladder: Secondary | ICD-10-CM

## 2023-09-13 ENCOUNTER — Telehealth: Payer: Self-pay | Admitting: Physical Therapy

## 2023-09-13 ENCOUNTER — Ambulatory Visit: Payer: 59 | Attending: Obstetrics and Gynecology | Admitting: Physical Therapy

## 2023-09-13 NOTE — Telephone Encounter (Signed)
 Physical therapist called pt re: missed appt today.  We would be grateful if pt can please call to confirm whether they wish to keep or cancel remaining appts [phone].

## 2023-09-16 ENCOUNTER — Encounter: Payer: Self-pay | Admitting: Family

## 2023-09-16 NOTE — Assessment & Plan Note (Signed)
 Checking labs today.  Continue current therapy for lipid control. Will modify as needed based on labwork results.

## 2023-09-16 NOTE — Assessment & Plan Note (Signed)
 Patient is seen by Psychiatry, who manage this condition.  She is well controlled with current therapy.   Will defer to them for further changes to plan of care.

## 2023-09-16 NOTE — Progress Notes (Signed)
 Established Patient Office Visit  Subjective:  Patient ID: Brooke Jenkins, female    DOB: [birth year]  Age: 68 y.o. MRN: [id number]  Chief Complaint  Patient presents with   Follow-up    2 week    Pt. Here today for 2 week n/p follow up.   Had labs done at that time, so we will review in detail today.   Labs: Good overall, reviewed results, patient questions answered.  No other concerns today.     Past Medical History:  Diagnosis Date   Acute upper respiratory infection 03/03/2012   Anxiety    Arthritis    Cataracts, bilateral    Change in bowel habits 01/02/2023   Chronic hepatitis C (HCC) 04/25/2010   Depression    History of hepatitis    History of nephrolithiasis 03/28/2016   Insomnia    Kidney stones    Tinnitus     Past Surgical History:  Procedure Laterality Date   CHOLECYSTECTOMY     COLONOSCOPY WITH PROPOFOL N/A 05/19/2019   Procedure: COLONOSCOPY WITH PROPOFOL;  Surgeon: Toney Reil, MD;  Location: [facility] ENDOS[facility];  Service: Gastroenterology;  Laterality: N/A;   CORONARY STENT INTERVENTION N/A 08/16/2023   Procedure: CORONARY STENT INTERVENTION;  Surgeon: Alwyn Pea, MD;  Location: [facility];  Service: Cardiovascular;  Laterality: N/A;   ESOPHAGOGASTRODUODENOSCOPY (EGD) WITH PROPOFOL N/A 05/19/2019   Procedure: ESOPHAGOGASTRODUODENOSCOPY (EGD) WITH PROPOFOL;  Surgeon: Toney Reil, MD;  Location: [facility] ENDOSCOPY;  Service: Gastroenterology;  Laterality: N/A;   EXTRACORPOREAL SHOCK WAVE LITHOTRIPSY Left 11/03/2021   Procedure: EXTRACORPOREAL SHOCK WAVE LITHOTRIPSY (ESWL);  Surgeon: Riki Altes, MD;  Location: [facility] ORS;  Service: Urology;  Laterality: Left;   EXTRACORPOREAL SHOCK WAVE LITHOTRIPSY Left 10/27/2021   Procedure: EXTRACORPOREAL SHOCK WAVE LITHOTRIPSY (ESWL);  Surgeon: Vanna Scotland, MD;  Location: [facility] ORS;  Service: Urology;  Laterality: Left;   HEMORROIDECTOMY     KIDNEY STONE SURGERY     LEFT HEART CATH  AND CORONARY ANGIOGRAPHY Left 08/16/2023   Procedure: LEFT HEART CATH AND CORONARY ANGIOGRAPHY;  Surgeon: Laurier Nancy, MD;  Location: [facility];  Service: Cardiovascular;  Laterality: Left;   LOWER EXTREMITY ANGIOGRAPHY Right 08/16/2023   Procedure: Lower Extremity Angiography;  Surgeon: Annice Needy, MD;  Location: [facility];  Service: Cardiovascular;  Laterality: Right;   TEMPOROMANDIBULAR JOINT SURGERY      Social History   Socioeconomic History   Marital status: Single    Spouse name: Not on file   Number of children: 0   Years of education: Not on file   Highest education level: Not on file  Occupational History   Not on file  Tobacco Use   Smoking status: Every Day    Current packs/day: 0.25    Average packs/day: 0.3 packs/day for 40.0 years (10.0 ttl pk-yrs)    Types: Cigarettes   Smokeless tobacco: Never   Tobacco comments:    Pt smokes 3 cigarettes a day   Vaping Use   Vaping status: Every Day   Substances: Nicotine, Flavoring  Substance and Sexual Activity   Alcohol use: No   Drug use: Not Currently   Sexual activity: Not Currently    Birth control/protection: Post-menopausal  Other Topics Concern   Not on file  Social History Narrative   Not on file   Social Drivers of Health   Financial Resource Strain: Low Risk  (09/14/2023)   Received from [facility]  Overall Financial Resource Strain (CARDIA)    Difficulty of Paying Living Expenses: Not hard at all  Food Insecurity: Food Insecurity Present (09/14/2023)   Received from [facility]   Hunger Vital Sign    Worried About Running Out of Food in the Last Year: Sometimes true    Ran Out of Food in the Last Year: Never true  Transportation Needs: No Transportation Needs (09/14/2023)   Received from [facility] - Transportation    In the past 12 months, has lack of transportation kept you from medical appointments or from  getting medications?: No    Lack of Transportation (Non-Medical): No  Physical Activity: Inactive (03/08/2021)   Received from [facility]   Exercise Vital Sign    Days of Exercise per Week: 0 days    Minutes of Exercise per Session: 0 min  Stress: Stress Concern Present (03/15/2021)   Received from [facility] - Occupational Stress Questionnaire    Feeling of Stress : Very much  Social Connections: Unknown (08/17/2023)   Social Connection and Isolation Panel [NHANES]    Frequency of Communication with Friends and Family: Never    Frequency of Social Gatherings with Friends and Family: Never    Attends Religious Services: Never    Database administrator or Organizations: No    Attends Banker Meetings: Never    Marital Status: Patient declined  Catering manager Violence: Not At Risk (08/17/2023)   Humiliation, Afraid, Rape, and Kick questionnaire    Fear of Current or Ex-Partner: No    Emotionally Abused: No    Physically Abused: No    Sexually Abused: No    Family History  Problem Relation Age of Onset   Dementia Mother    Alzheimer's disease Mother    Parkinson's disease Mother    Migraines Mother    Other Mother        Back issues   Other Father        Back Issues   Dementia Father    Arthritis Maternal Grandmother    Migraines Maternal Grandfather     Allergies  Allergen Reactions   Amoxicillin-Pot Clavulanate Nausea And Vomiting   Bee Venom Anaphylaxis    As a child   Mirabegron Cough, Other (See Comments) and Shortness Of Breath    SOB, sore throat, cough, tremor, upset stomachs   Aspirin Other (See Comments)    Abdominal Pain   Ciprofloxacin Nausea And Vomiting   Tramadol Nausea And Vomiting   Vicodin [Hydrocodone-Acetaminophen] Itching    Review of Systems  All other systems reviewed and are negative.      Objective:   BP 118/70   Pulse 100   Ht 5' (1.524  m)   Wt 100 lb 6.4 oz (45.5 kg)   SpO2 96%   BMI 19.61 kg/m   Vitals:   06/21/23 1127  BP: 118/70  Pulse: 100  Height: 5' (1.524 m)  Weight: 100 lb 6.4 oz (45.5 kg)  SpO2: 96%  BMI (Calculated): 19.61    Physical Exam Vitals and nursing note reviewed.  Constitutional:      Appearance: Normal appearance. She is normal weight.  HENT:     Head: Normocephalic.  Eyes:     Extraocular Movements: Extraocular movements intact.     Conjunctiva/sclera: Conjunctivae normal.     Pupils: Pupils are equal, round, and reactive to light.  Cardiovascular:     Rate and Rhythm: Normal rate.  Pulmonary:     Effort: Pulmonary effort is normal.  Neurological:     General: No focal deficit present.     Mental Status: She is alert and oriented to person, place, and time. Mental status is at baseline.  Psychiatric:        Attention and Perception: Perception normal. She is inattentive.        Mood and Affect: Mood is anxious. Affect is flat.        Speech: Speech normal.        Behavior: Behavior normal. Behavior is cooperative.        Thought Content: Thought content normal.      No results found for any visits on 06/21/23.  Recent Results (from the past 2160 hours)  CBC with Differential/Platelet     Status: Abnormal   Collection Time: 08/09/23 11:51 AM  Result Value Ref Range   WBC 5.6 3.4 - 10.8 x10E3/uL   RBC 4.13 3.77 - 5.28 x10E6/uL   Hemoglobin 13.3 11.1 - 15.9 g/dL   Hematocrit 78.2 95.6 - 46.6 %   MCV 100 (H) 79 - 97 fL   MCH 32.2 26.6 - 33.0 pg   MCHC 32.2 31.5 - 35.7 g/dL   RDW 21.3 (L) 08.6 - 57.8 %   Platelets 251 150 - 450 x10E3/uL   Neutrophils 61 Not Estab. %   Lymphs 30 Not Estab. %   Monocytes 7 Not Estab. %   Eos 1 Not Estab. %   Basos 1 Not Estab. %   Neutrophils Absolute 3.4 1.4 - 7.0 x10E3/uL   Lymphocytes Absolute 1.7 0.7 - 3.1 x10E3/uL   Monocytes Absolute 0.4 0.1 - 0.9 x10E3/uL   EOS (ABSOLUTE) 0.1 0.0 - 0.4 x10E3/uL   Basophils Absolute 0.1 0.0 -  0.2 x10E3/uL   Immature Granulocytes 0 Not Estab. %   Immature Grans (Abs) 0.0 0.0 - 0.1 x10E3/uL  Comprehensive metabolic panel     Status: None   Collection Time: 08/09/23 11:52 AM  Result Value Ref Range   Glucose 80 70 - 99 mg/dL   BUN 15 8 - 27 mg/dL   Creatinine, Ser 4.69 0.57 - 1.00 mg/dL   eGFR 92 >62 XB/MWU/1.32   BUN/Creatinine Ratio 21 12 - 28   Sodium 143 134 - 144 mmol/L   Potassium 4.4 3.5 - 5.2 mmol/L   Chloride 104 96 - 106 mmol/L   CO2 24 20 - 29 mmol/L   Calcium 9.9 8.7 - 10.3 mg/dL   Total Protein 7.2 6.0 - 8.5 g/dL   Albumin 4.6 3.9 - 4.9 g/dL   Globulin, Total 2.6 1.5 - 4.5 g/dL   Bilirubin Total 0.2 0.0 - 1.2 mg/dL   Alkaline Phosphatase 94 44 - 121 IU/L   AST 24 0 - 40 IU/L   ALT 28 0 - 32 IU/L  POCT Activated clotting time     Status: None   Collection Time: 08/16/23  2:19 PM  Result Value Ref Range   Activated Clotting Time 302 seconds    Comment: Reference range 74-137 seconds for patients not on anticoagulant therapy.  CBC     Status: Abnormal   Collection Time: 08/16/23  6:42 PM  Result Value Ref Range   WBC 8.4 4.0 - 10.5 K/uL   RBC 3.96 3.87 - 5.11 MIL/uL   Hemoglobin 13.1 12.0 - 15.0 g/dL   HCT 44.0 10.2 - 72.5 %   MCV 102.8 (  H) 80.0 - 100.0 fL   MCH 33.1 26.0 - 34.0 pg   MCHC 32.2 30.0 - 36.0 g/dL   RDW 86.5 78.4 - 69.6 %   Platelets 214 150 - 400 K/uL   nRBC 0.0 0.0 - 0.2 %    Comment: Performed at [facility], [address] [place], [place] [address]  Basic metabolic panel     Status: Abnormal   Collection Time: 08/17/23  5:45 AM  Result Value Ref Range   Sodium 138 135 - 145 mmol/L   Potassium 3.8 3.5 - 5.1 mmol/L   Chloride 104 98 - 111 mmol/L   CO2 25 22 - 32 mmol/L   Glucose, Bld 108 (H) 70 - 99 mg/dL    Comment: Glucose reference range applies only to samples taken after fasting for at least 8 hours.   BUN 13 8 - 23 mg/dL   Creatinine, Ser 4.13 0.44 - 1.00 mg/dL   Calcium 8.6 (L) 8.9 - 10.3 mg/dL   GFR,  Estimated >24 >40 mL/min    Comment: (NOTE) Calculated using the CKD-EPI Creatinine Equation (2021)    Anion gap 9 5 - 15    Comment: Performed at [facility], [address] [place], [place] [address]  CBC     Status: Abnormal   Collection Time: 08/17/23  5:45 AM  Result Value Ref Range   WBC 6.9 4.0 - 10.5 K/uL   RBC 3.52 (L) 3.87 - 5.11 MIL/uL   Hemoglobin 11.7 (L) 12.0 - 15.0 g/dL   HCT 53.6 (L) 64.4 - 03.4 %   MCV 98.0 80.0 - 100.0 fL   MCH 33.2 26.0 - 34.0 pg   MCHC 33.9 30.0 - 36.0 g/dL   RDW 74.2 59.5 - 63.8 %   Platelets 221 150 - 400 K/uL   nRBC 0.0 0.0 - 0.2 %    Comment: Performed at [facility], [address] [place], [place] [address]  Lipoprotein A (LPA)     Status: Abnormal   Collection Time: 08/17/23  5:45 AM  Result Value Ref Range   Lipoprotein (a) 115.2 (H) <75.0 nmol/L    Comment: (NOTE) Note:  Values greater than or equal to 75.0 nmol/L may       indicate an independent risk factor for CHD,       but must be evaluated with caution when applied       to non-Caucasian populations due to the       influence of genetic factors on Lp(a) across       ethnicities. Performed At: [facility] [address] [place], [place] [id number] Jolene Schimke MD [id number]   Lipid panel     Status: None   Collection Time: 08/17/23  5:45 AM  Result Value Ref Range   Cholesterol 133 0 - 200 mg/dL   Triglycerides 323 <557 mg/dL   HDL 47 >32 mg/dL   Total CHOL/HDL Ratio 2.8 RATIO   VLDL 22 0 - 40 mg/dL   LDL Cholesterol 64 0 - 99 mg/dL    Comment:        Total Cholesterol/HDL:CHD Risk Coronary Heart Disease Risk Table                     Men   Women  1/2 Average Risk   3.4   3.3  Average Risk       5.0   4.4  2 X Average Risk   9.6  7.1  3 X Average Risk  23.4   11.0        Use the calculated Patient Ratio above and the CHD Risk Table to determine the patient's CHD Risk.        ATP III CLASSIFICATION (LDL):  <100     mg/dL    Optimal  161-096  mg/dL   Near or Above                    Optimal  130-159  mg/dL   Borderline  045-409  mg/dL   High  >811     mg/dL   Very High Performed at [facility], [address] [place], [place] [address]   Glucose, capillary     Status: Abnormal   Collection Time: 08/17/23 11:37 AM  Result Value Ref Range   Glucose-Capillary 185 (H) 70 - 99 mg/dL    Comment: Glucose reference range applies only to samples taken after fasting for at least 8 hours.  Urinalysis, Complete w Microscopic -Urine, Clean Catch     Status: Abnormal   Collection Time: 08/17/23  2:47 PM  Result Value Ref Range   Color, Urine YELLOW (A) YELLOW   APPearance CLOUDY (A) CLEAR   Specific Gravity, Urine 1.040 (H) 1.005 - 1.030   pH 6.0 5.0 - 8.0   Glucose, UA NEGATIVE NEGATIVE mg/dL   Hgb urine dipstick LARGE (A) NEGATIVE   Bilirubin Urine NEGATIVE NEGATIVE   Ketones, ur NEGATIVE NEGATIVE mg/dL   Protein, ur 295 (A) NEGATIVE mg/dL   Nitrite NEGATIVE NEGATIVE   Leukocytes,Ua NEGATIVE NEGATIVE   RBC / HPF >50 0 - 5 RBC/hpf   WBC, UA >50 0 - 5 WBC/hpf   Bacteria, UA FEW (A) NONE SEEN   Squamous Epithelial / HPF 0 0 - 5 /HPF   Mucus PRESENT     Comment: Performed at [facility], [address], [place], [place] [address]  Basic metabolic panel     Status: Abnormal   Collection Time: 08/18/23  4:32 AM  Result Value Ref Range   Sodium 141 135 - 145 mmol/L   Potassium 3.9 3.5 - 5.1 mmol/L   Chloride 106 98 - 111 mmol/L   CO2 25 22 - 32 mmol/L   Glucose, Bld 109 (H) 70 - 99 mg/dL    Comment: Glucose reference range applies only to samples taken after fasting for at least 8 hours.   BUN 12 8 - 23 mg/dL   Creatinine, Ser 8.65 0.44 - 1.00 mg/dL   Calcium 8.8 (L) 8.9 - 10.3 mg/dL   GFR, Estimated >78 >46 mL/min    Comment: (NOTE) Calculated using the CKD-EPI Creatinine Equation (2021)    Anion gap 10 5 - 15    Comment: Performed at [facility], [address]  [place], [place] [address]  CBC     Status: Abnormal   Collection Time: 08/18/23  4:32 AM  Result Value Ref Range   WBC 7.0 4.0 - 10.5 K/uL   RBC 3.42 (L) 3.87 - 5.11 MIL/uL   Hemoglobin 11.4 (L) 12.0 - 15.0 g/dL   HCT 28.4 (L) 13.2 - 44.0 %   MCV 100.0 80.0 - 100.0 fL   MCH 33.3 26.0 - 34.0 pg   MCHC 33.3 30.0 - 36.0 g/dL   RDW 10.2 72.5 - 36.6 %   Platelets 230 150 - 400 K/uL   nRBC 0.0 0.0 - 0.2 %    Comment: Performed at  [facility], [address] [place], [place] [address]  Magnesium     Status: None   Collection Time: 08/18/23  4:32 AM  Result Value Ref Range   Magnesium 1.9 1.7 - 2.4 mg/dL    Comment: Performed at [facility], [address] [place], [place] [address]       Assessment & Plan:   Problem List Items Addressed This Visit       Digestive   GERD (gastroesophageal reflux disease)     Other   Anxiety disorder   Depression, major, recurrent, in partial remission (HCC)   Patient is seen by Psychiatry, who manage this condition.  She is well controlled with current therapy.   Will defer to them for further changes to plan of care.       Mixed hyperlipidemia - Primary    Return in about 1 month (around 07/22/2023).   Total time spent: 20 minutes  Miki Kins, FNP  06/21/2023   This document may have been prepared by [facility] Voice Recognition software and as such may include unintentional dictation errors.

## 2023-09-17 ENCOUNTER — Ambulatory Visit: Payer: 59 | Attending: Obstetrics and Gynecology | Admitting: Physical Therapy

## 2023-09-17 ENCOUNTER — Ambulatory Visit: Payer: 59 | Admitting: Physician Assistant

## 2023-09-17 DIAGNOSIS — I724 Aneurysm of artery of lower extremity: Secondary | ICD-10-CM | POA: Diagnosis not present

## 2023-09-17 DIAGNOSIS — E782 Mixed hyperlipidemia: Secondary | ICD-10-CM | POA: Diagnosis not present

## 2023-09-17 DIAGNOSIS — Z955 Presence of coronary angioplasty implant and graft: Secondary | ICD-10-CM | POA: Diagnosis not present

## 2023-09-17 DIAGNOSIS — F17200 Nicotine dependence, unspecified, uncomplicated: Secondary | ICD-10-CM | POA: Diagnosis not present

## 2023-09-17 DIAGNOSIS — I2510 Atherosclerotic heart disease of native coronary artery without angina pectoris: Secondary | ICD-10-CM | POA: Diagnosis not present

## 2023-09-17 DIAGNOSIS — K219 Gastro-esophageal reflux disease without esophagitis: Secondary | ICD-10-CM | POA: Diagnosis not present

## 2023-09-19 ENCOUNTER — Ambulatory Visit: Admitting: Physician Assistant

## 2023-10-03 DIAGNOSIS — M7061 Trochanteric bursitis, right hip: Secondary | ICD-10-CM | POA: Diagnosis not present

## 2023-10-03 DIAGNOSIS — M7918 Myalgia, other site: Secondary | ICD-10-CM | POA: Diagnosis not present

## 2023-10-03 DIAGNOSIS — M170 Bilateral primary osteoarthritis of knee: Secondary | ICD-10-CM | POA: Diagnosis not present

## 2023-10-03 DIAGNOSIS — M5416 Radiculopathy, lumbar region: Secondary | ICD-10-CM | POA: Diagnosis not present

## 2023-11-05 ENCOUNTER — Ambulatory Visit (INDEPENDENT_AMBULATORY_CARE_PROVIDER_SITE_OTHER): Admitting: Physician Assistant

## 2023-11-05 VITALS — BP 124/84 | HR 89 | Ht 60.0 in | Wt 98.0 lb

## 2023-11-05 DIAGNOSIS — R310 Gross hematuria: Secondary | ICD-10-CM | POA: Diagnosis not present

## 2023-11-05 LAB — URINALYSIS, COMPLETE
Bilirubin, UA: NEGATIVE
Glucose, UA: NEGATIVE
Ketones, UA: NEGATIVE
Nitrite, UA: NEGATIVE
Protein,UA: NEGATIVE
Specific Gravity, UA: 1.015 (ref 1.005–1.030)
Urobilinogen, Ur: 0.2 mg/dL (ref 0.2–1.0)
pH, UA: 6.5 (ref 5.0–7.5)

## 2023-11-05 LAB — MICROSCOPIC EXAMINATION: RBC, Urine: >30 /HPF — AB (ref 0–2)

## 2023-11-05 NOTE — Patient Instructions (Addendum)
 CT scan scheduling: [phone]  Cystoscopy Cystoscopy is a procedure that is used to help diagnose and sometimes treat conditions that affect the lower urinary tract. The lower urinary tract includes the bladder and the urethra. The urethra is the tube that drains urine from the bladder. Cystoscopy is done using a thin, tube-shaped instrument with a light and camera at the end (cystoscope). The cystoscope may be hard or flexible, depending on the goal of the procedure. The cystoscope is inserted through the urethra, into the bladder. Cystoscopy may be recommended if you have: Urinary tract infections that keep coming back. Blood in the urine (hematuria). An inability to control when you urinate (urinary incontinence) or an overactive bladder. Unusual cells found in a urine sample. A blockage in the urethra, such as a urinary stone. Painful urination. An abnormality in the bladder found during an intravenous pyelogram (IVP) or CT scan. What are the risks? Generally, this is a safe procedure. However, problems may occur, including: Infection. Bleeding.  What happens during the procedure?  You will be given one or more of the following: A medicine to numb the area (local anesthetic). The area around the opening of your urethra will be cleaned. The cystoscope will be passed through your urethra into your bladder. Germ-free (sterile) fluid will flow through the cystoscope to fill your bladder. The fluid will stretch your bladder so that your health care provider can clearly examine your bladder walls. Your doctor will look at the urethra and bladder. The cystoscope will be removed The procedure may vary among health care providers  What can I expect after the procedure? After the procedure, it is common to have: Some soreness or pain in your urethra. Urinary symptoms. These include: Mild pain or burning when you urinate. Pain should stop within a few minutes after you urinate. This may  last for up to a few days after the procedure. A small amount of blood in your urine for several days. Feeling like you need to urinate but producing only a small amount of urine. Follow these instructions at home: General instructions Return to your normal activities as told by your health care provider.  Drink plenty of fluids after the procedure. Keep all follow-up visits as told by your health care provider. This is important. Contact a health care provider if you: Have pain that gets worse or does not get better with medicine, especially pain when you urinate lasting longer than 72 hours after the procedure. Have trouble urinating. Get help right away if you: Have blood clots in your urine. Have a fever or chills. Are unable to urinate. Summary Cystoscopy is a procedure that is used to help diagnose and sometimes treat conditions that affect the lower urinary tract. Cystoscopy is done using a thin, tube-shaped instrument with a light and camera at the end. After the procedure, it is common to have some soreness or pain in your urethra. It is normal to have blood in your urine after the procedure.  If you were prescribed an antibiotic medicine, take it as told by your health care provider.  This information is not intended to replace advice given to you by your health care provider. Make sure you discuss any questions you have with your health care provider. Document Revised: 06/25/2018 Document Reviewed: 06/25/2018 Elsevier Patient Education  2020 [employer].

## 2023-11-05 NOTE — Progress Notes (Signed)
 11/05/2023 3:04 PM   Brooke Jenkins [birth year] [id number]  CC: Chief Complaint  Patient presents with   Hematuria   HPI: Brooke Jenkins is a 68 y.o. 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   Today she reports intermittent gross hematuria since her cardiac procedure at the end of January.  Notably, she is now on Brilinta .  She denies dysuria or flank pain.  She strains to void but admits that this is chronic.  She was referred for pelvic floor PT last year but discontinued treatment after 5 visits.  She states today that she did not find it helpful.  She is a current smoker and uses a vape.  Estimated 30-pack-year smoking history.  In-office UA today positive for 3+ blood and trace leukocytes; urine microscopy with 6-10 WBCs/HPF, >30 RBCs/HPF, and moderate bacteria.  PMH: Past Medical History:  Diagnosis Date   Acute upper respiratory infection 03/03/2012   Anxiety    Arthritis    Cataracts, bilateral    Change in bowel habits 01/02/2023   Chronic hepatitis C (HCC) 04/25/2010   Depression    History of hepatitis    History of nephrolithiasis 03/28/2016   Insomnia    Kidney stones    Tinnitus     Surgical History: Past Surgical History:  Procedure Laterality Date   CHOLECYSTECTOMY     COLONOSCOPY WITH PROPOFOL  N/A 05/19/2019   Procedure: COLONOSCOPY WITH PROPOFOL ;  Surgeon: Selena Daily, MD;  Location: [facility];  Service: Gastroenterology;  Laterality: N/A;   CORONARY STENT INTERVENTION N/A 08/16/2023   Procedure: CORONARY STENT INTERVENTION;  Surgeon: Antonette Batters, MD;  Location: [facility];  Service: Cardiovascular;  Laterality: N/A;   ESOPHAGOGASTRODUODENOSCOPY (EGD) WITH PROPOFOL  N/A 05/19/2019   Procedure: ESOPHAGOGASTRODUODENOSCOPY (EGD) WITH PROPOFOL ;  Surgeon: Selena Daily, MD;   Location: [facility];  Service: Gastroenterology;  Laterality: N/A;   EXTRACORPOREAL SHOCK WAVE LITHOTRIPSY Left 11/03/2021   Procedure: EXTRACORPOREAL SHOCK WAVE LITHOTRIPSY (ESWL);  Surgeon: Geraline Knapp, MD;  Location: [facility];  Service: Urology;  Laterality: Left;   EXTRACORPOREAL SHOCK WAVE LITHOTRIPSY Left 10/27/2021   Procedure: EXTRACORPOREAL SHOCK WAVE LITHOTRIPSY (ESWL);  Surgeon: Dustin Gimenez, MD;  Location: [facility];  Service: Urology;  Laterality: Left;   HEMORROIDECTOMY     KIDNEY STONE SURGERY     LEFT HEART CATH AND CORONARY ANGIOGRAPHY Left 08/16/2023   Procedure: LEFT HEART CATH AND CORONARY ANGIOGRAPHY;  Surgeon: Cherrie Cornwall, MD;  Location: [facility];  Service: Cardiovascular;  Laterality: Left;   LOWER EXTREMITY ANGIOGRAPHY Right 08/16/2023   Procedure: Lower Extremity Angiography;  Surgeon: Celso College, MD;  Location: [facility];  Service: Cardiovascular;  Laterality: Right;   TEMPOROMANDIBULAR JOINT SURGERY      Home Medications:  Allergies as of 11/05/2023       Reactions   Amoxicillin-pot Clavulanate Nausea And Vomiting   Bee Venom Anaphylaxis   As a child   Mirabegron  Cough, Other (See Comments), Shortness Of Breath   SOB, sore throat, cough, tremor, upset stomachs   Aspirin  Other (See Comments)   Abdominal Pain   Ciprofloxacin Nausea And Vomiting   Tramadol Nausea And Vomiting   Vicodin [hydrocodone-acetaminophen ] Itching        Medication List        Accurate as of November 05, 2023  3:04 PM.  If you have any questions, ask your nurse or doctor.          STOP taking these medications    aspirin  81 MG chewable tablet Stopped by: Kathreen Pare   docusate sodium  100 MG capsule Commonly known as: COLACE Stopped by: Chace Klippel       TAKE these medications    acetaminophen  500 MG tablet Commonly known as: TYLENOL  Take 1,000 mg by mouth in the morning.   Austedo 6 MG Tabs Generic drug:  Deutetrabenazine Take 1 tablet by mouth 2 (two) times daily.   butalbital -acetaminophen -caffeine  50-325-40 MG tablet Commonly known as: FIORICET  Take 1 tablet by mouth every 6 (six) hours as needed for migraine.   cetirizine 10 MG tablet Commonly known as: ZYRTEC Take 10 mg by mouth daily.   clonazePAM  1 MG tablet Commonly known as: KLONOPIN  Take 1-2 mg by mouth at bedtime.   cloNIDine 0.2 MG tablet Commonly known as: CATAPRES Take 0.2 mg by mouth daily as needed (hot flashes).   cyclobenzaprine  5 MG tablet Commonly known as: FLEXERIL  Take 1 tablet (5 mg total) by mouth 3 (three) times daily as needed.   DayVigo  10 MG Tabs Generic drug: Lemborexant  Take 10 mg by mouth at bedtime.   gabapentin  100 MG capsule Commonly known as: NEURONTIN  Take 1 capsule (100 mg total) by mouth at bedtime. What changed: how much to take   Lurasidone  HCl 60 MG Tabs Take 60 mg by mouth daily.   methylphenidate 10 MG tablet Commonly known as: RITALIN Take 10 mg by mouth in the morning.   rosuvastatin  20 MG tablet Commonly known as: CRESTOR  Take 1 tablet (20 mg total) by mouth daily. Increased from 5 mg.   ticagrelor  90 MG Tabs tablet Commonly known as: BRILINTA  Take 1 tablet (90 mg total) by mouth 2 (two) times daily.        Allergies:  Allergies  Allergen Reactions   Amoxicillin-Pot Clavulanate Nausea And Vomiting   Bee Venom Anaphylaxis    As a child   Mirabegron  Cough, Other (See Comments) and Shortness Of Breath    SOB, sore throat, cough, tremor, upset stomachs   Aspirin  Other (See Comments)    Abdominal Pain   Ciprofloxacin Nausea And Vomiting   Tramadol Nausea And Vomiting   Vicodin [Hydrocodone-Acetaminophen ] Itching    Family History: Family History  Problem Relation Age of Onset   Dementia Mother    Alzheimer's disease Mother    Parkinson's disease Mother    Migraines Mother    Other Mother        Back issues   Other Father        Back Issues   Dementia  Father    Arthritis Maternal Grandmother    Migraines Maternal Grandfather     Social History:   reports that she has been smoking cigarettes. She has a 10 pack-year smoking history. She has never used smokeless tobacco. She reports that she does not currently use drugs. She reports that she does not drink alcohol.  Physical Exam: BP 124/84   Pulse 89   Ht 5' (1.524 m)   Wt 98 lb (44.5 kg)   BMI 19.14 kg/m   Constitutional:  Alert and oriented, no acute distress, nontoxic appearing HEENT: [place], AT Cardiovascular: No clubbing, cyanosis, or edema Respiratory: Normal respiratory effort, no increased work of breathing Skin: No rashes, bruises or suspicious lesions Neurologic: Grossly intact, no focal deficits, moving all 4 extremities Psychiatric: Normal mood and affect  Laboratory  Data: Results for orders placed or performed in visit on 11/05/23  Microscopic Examination   Collection Time: 11/05/23  2:06 PM   Urine  Result Value Ref Range   WBC, UA 6-10 (A) 0 - 5 /hpf   RBC, Urine >30 (A) 0 - 2 /hpf   Epithelial Cells (non renal) 0-10 0 - 10 /hpf   Crystals Present (A) N/A   Crystal Type Amorphous Sediment N/A   Mucus, UA Present (A) Not Estab.   Bacteria, UA Moderate (A) None seen/Few  Urinalysis, Complete   Collection Time: 11/05/23  2:06 PM  Result Value Ref Range   Specific Gravity, UA 1.015 1.005 - 1.030   pH, UA 6.5 5.0 - 7.5   Color, UA Yellow Yellow   Appearance Ur Hazy (A) Clear   Leukocytes,UA Trace (A) Negative   Protein,UA Negative Negative/Trace   Glucose, UA Negative Negative   Ketones, UA Negative Negative   RBC, UA 3+ (A) Negative   Bilirubin, UA Negative Negative   Urobilinogen, Ur 0.2 0.2 - 1.0 mg/dL   Nitrite, UA Negative Negative   Microscopic Examination See below:    Assessment & Plan:   1. Gross hematuria (Primary) 3 months of intermittent painless gross hematuria since undergoing cardiac PCI and starting on Brilinta .  History notable for  approximate 30-pack-year smoking history and nephrolithiasis.  I had a lengthy conversation today with the patient regarding the etiology of blood in the urine.  I explained that blood in the urine can be caused by a myriad of factors, including but not limited to infection, stones, cysts, anticoagulation, and urinary tract malignancies.  I explained that the recommended work-up for blood in the urine is twofold and includes a CT urogram for evaluation of the upper urinary tract including kidneys and ureters as well as a cystoscopy for evaluation of the urethra and bladder.  I explained that these two studies complement one another in reviewing the entire urinary tract for possible causes of bleeding.  I recommended that we proceed with this at this time.  Patient agreed; CTU ordered and follow-up cysto with CTU results scheduled.  - Urinalysis, Complete - CULTURE, URINE COMPREHENSIVE - CT HEMATURIA WORKUP; Future   Return in about 4 weeks (around 12/03/2023) for Cysto and CTU results.  Kathreen Pare, PA-C  [facility] [place] [address], [address] [place], [place] [address] [phone]

## 2023-11-08 LAB — CULTURE, URINE COMPREHENSIVE

## 2023-11-14 DIAGNOSIS — M25561 Pain in right knee: Secondary | ICD-10-CM | POA: Diagnosis not present

## 2023-11-14 DIAGNOSIS — M170 Bilateral primary osteoarthritis of knee: Secondary | ICD-10-CM | POA: Diagnosis not present

## 2023-11-14 DIAGNOSIS — M25562 Pain in left knee: Secondary | ICD-10-CM | POA: Diagnosis not present

## 2023-11-15 ENCOUNTER — Encounter: Payer: Self-pay | Admitting: Family

## 2023-11-15 ENCOUNTER — Ambulatory Visit: Admitting: Family

## 2023-11-15 VITALS — BP 128/72 | HR 106 | Ht 60.0 in | Wt 97.4 lb

## 2023-11-15 DIAGNOSIS — E538 Deficiency of other specified B group vitamins: Secondary | ICD-10-CM | POA: Diagnosis not present

## 2023-11-15 DIAGNOSIS — Z0130 Encounter for examination of blood pressure without abnormal findings: Secondary | ICD-10-CM

## 2023-11-15 DIAGNOSIS — R0602 Shortness of breath: Secondary | ICD-10-CM | POA: Diagnosis not present

## 2023-11-15 DIAGNOSIS — E559 Vitamin D deficiency, unspecified: Secondary | ICD-10-CM | POA: Diagnosis not present

## 2023-11-15 DIAGNOSIS — E782 Mixed hyperlipidemia: Secondary | ICD-10-CM

## 2023-11-15 DIAGNOSIS — R7303 Prediabetes: Secondary | ICD-10-CM

## 2023-11-15 DIAGNOSIS — R0789 Other chest pain: Secondary | ICD-10-CM | POA: Diagnosis not present

## 2023-11-15 DIAGNOSIS — F17200 Nicotine dependence, unspecified, uncomplicated: Secondary | ICD-10-CM | POA: Diagnosis not present

## 2023-11-15 DIAGNOSIS — R5383 Other fatigue: Secondary | ICD-10-CM

## 2023-11-15 DIAGNOSIS — N951 Menopausal and female climacteric states: Secondary | ICD-10-CM

## 2023-11-15 MED ORDER — ROSUVASTATIN CALCIUM 20 MG PO TABS: 20.0000 mg | ORAL_TABLET | Freq: Every day | ORAL | 2 refills | Status: DC

## 2023-11-15 NOTE — Progress Notes (Signed)
 Established Patient Office Visit  Subjective:  Patient ID: Brooke Jenkins, female    DOB: 03-09-1956  Age: 68 y.o. MRN: [id number]  Chief Complaint  Patient presents with   Follow-up    Patient is here today for her follow up.  She has been feeling fairly well since last appointment.   She does not have additional concerns to discuss today.  Labs are due today.  She needs refills.   I have reviewed her active problem list, medication list, allergies, notes from last encounter, lab results for her appointment today.      No other concerns at this time.   Past Medical History:  Diagnosis Date   Acute upper respiratory infection 03/03/2012   Anxiety    Arthritis    Cataracts, bilateral    Change in bowel habits 01/02/2023   Chronic hepatitis C (HCC) 04/25/2010   Depression    History of hepatitis    History of nephrolithiasis 03/28/2016   Insomnia    Kidney stones    Tinnitus     Past Surgical History:  Procedure Laterality Date   CHOLECYSTECTOMY     COLONOSCOPY WITH PROPOFOL  N/A 05/19/2019   Procedure: COLONOSCOPY WITH PROPOFOL ;  Surgeon: Unk Corinn Skiff, MD;  Location: [facility];  Service: Gastroenterology;  Laterality: N/A;   CORONARY STENT INTERVENTION N/A 08/16/2023   Procedure: CORONARY STENT INTERVENTION;  Surgeon: Florencio Cara BIRCH, MD;  Location: [facility];  Service: Cardiovascular;  Laterality: N/A;   ESOPHAGOGASTRODUODENOSCOPY (EGD) WITH PROPOFOL  N/A 05/19/2019   Procedure: ESOPHAGOGASTRODUODENOSCOPY (EGD) WITH PROPOFOL ;  Surgeon: Unk Corinn Skiff, MD;  Location: [facility];  Service: Gastroenterology;  Laterality: N/A;   EXTRACORPOREAL SHOCK WAVE LITHOTRIPSY Left 11/03/2021   Procedure: EXTRACORPOREAL SHOCK WAVE LITHOTRIPSY (ESWL);  Surgeon: Twylla Glendia BROCKS, MD;  Location: [facility];  Service: Urology;  Laterality: Left;   EXTRACORPOREAL SHOCK WAVE LITHOTRIPSY Left 10/27/2021   Procedure: EXTRACORPOREAL SHOCK WAVE LITHOTRIPSY  (ESWL);  Surgeon: Penne Knee, MD;  Location: [facility];  Service: Urology;  Laterality: Left;   HEMORROIDECTOMY     KIDNEY STONE SURGERY     LEFT HEART CATH AND CORONARY ANGIOGRAPHY Left 08/16/2023   Procedure: LEFT HEART CATH AND CORONARY ANGIOGRAPHY;  Surgeon: Fernand Denyse DELENA, MD;  Location: [facility];  Service: Cardiovascular;  Laterality: Left;   LOWER EXTREMITY ANGIOGRAPHY Right 08/16/2023   Procedure: Lower Extremity Angiography;  Surgeon: Marea Selinda RAMAN, MD;  Location: [facility];  Service: Cardiovascular;  Laterality: Right;   TEMPOROMANDIBULAR JOINT SURGERY      Social History   Socioeconomic History   Marital status: Single    Spouse name: Not on file   Number of children: 0   Years of education: Not on file   Highest education level: Not on file  Occupational History   Not on file  Tobacco Use   Smoking status: Every Day    Current packs/day: 0.25    Average packs/day: 0.3 packs/day for 40.0 years (10.0 ttl pk-yrs)    Types: Cigarettes   Smokeless tobacco: Never   Tobacco comments:    Pt smokes 3 cigarettes a day   Vaping Use   Vaping status: Every Day   Substances: Nicotine, Flavoring  Substance and Sexual Activity   Alcohol use: No   Drug use: Not Currently   Sexual activity: Not Currently    Birth control/protection: Post-menopausal  Other Topics Concern   Not on file  Social History Narrative   Not on  file   Social Drivers of Health   Financial Resource Strain: Low Risk  (09/14/2023)   Received from [facility]   Overall Financial Resource Strain (CARDIA)    Difficulty of Paying Living Expenses: Not hard at all  Food Insecurity: Food Insecurity Present (09/14/2023)   Received from [facility]   Hunger Vital Sign    Worried About Running Out of Food in the Last Year: Sometimes true    Ran Out of Food in the Last Year: Never true  Transportation Needs: No Transportation Needs (09/14/2023)    Received from [facility] - Transportation    In the past 12 months, has lack of transportation kept you from medical appointments or from getting medications?: No    Lack of Transportation (Non-Medical): No  Physical Activity: Inactive (03/08/2021)   Received from [facility]   Exercise Vital Sign    Days of Exercise per Week: 0 days    Minutes of Exercise per Session: 0 min  Stress: Stress Concern Present (03/15/2021)   Received from [facility] - Occupational Stress Questionnaire    Feeling of Stress : Very much  Social Connections: Unknown (08/17/2023)   Social Connection and Isolation Panel [NHANES]    Frequency of Communication with Friends and Family: Never    Frequency of Social Gatherings with Friends and Family: Never    Attends Religious Services: Never    Database administrator or Organizations: No    Attends Banker Meetings: Never    Marital Status: Patient declined  Catering manager Violence: Not At Risk (08/17/2023)   Humiliation, Afraid, Rape, and Kick questionnaire    Fear of Current or Ex-Partner: No    Emotionally Abused: No    Physically Abused: No    Sexually Abused: No    Family History  Problem Relation Age of Onset   Dementia Mother    Alzheimer's disease Mother    Parkinson's disease Mother    Migraines Mother    Other Mother        Back issues   Other Father        Back Issues   Dementia Father    Arthritis Maternal Grandmother    Migraines Maternal Grandfather     Allergies  Allergen Reactions   Amoxicillin-Pot Clavulanate Nausea And Vomiting   Bee Venom Anaphylaxis    As a child   Mirabegron  Cough, Other (See Comments) and Shortness Of Breath    SOB, sore throat, cough, tremor, upset stomachs   Aspirin  Other (See Comments)    Abdominal Pain   Ciprofloxacin Nausea And Vomiting   Tramadol Nausea And Vomiting   Vicodin  [Hydrocodone-Acetaminophen ] Itching    Review of Systems  All other systems reviewed and are negative.      Objective:   BP 128/72   Pulse (!) 106   Ht 5' (1.524 m)   Wt 97 lb 6.4 oz (44.2 kg)   SpO2 96%   BMI 19.02 kg/m   Vitals:   11/15/23 1125  BP: 128/72  Pulse: (!) 106  Height: 5' (1.524 m)  Weight: 97 lb 6.4 oz (44.2 kg)  SpO2: 96%  BMI (Calculated): 19.02    Physical Exam Vitals and nursing note reviewed.  Constitutional:      Appearance: Normal appearance. She is normal weight.  HENT:     Head: Normocephalic.  Eyes:  Extraocular Movements: Extraocular movements intact.     Conjunctiva/sclera: Conjunctivae normal.     Pupils: Pupils are equal, round, and reactive to light.  Cardiovascular:     Rate and Rhythm: Normal rate.  Pulmonary:     Effort: Pulmonary effort is normal.  Neurological:     General: No focal deficit present.     Mental Status: She is alert and oriented to person, place, and time. Mental status is at baseline.  Psychiatric:        Mood and Affect: Mood normal.        Behavior: Behavior normal.        Thought Content: Thought content normal.      No results found for any visits on 11/15/23.  Recent Results (from the past 2160 hours)  Basic metabolic panel     Status: Abnormal   Collection Time: 08/18/23  4:32 AM  Result Value Ref Range   Sodium 141 135 - 145 mmol/L   Potassium 3.9 3.5 - 5.1 mmol/L   Chloride 106 98 - 111 mmol/L   CO2 25 22 - 32 mmol/L   Glucose, Bld 109 (H) 70 - 99 mg/dL    Comment: Glucose reference range applies only to samples taken after fasting for at least 8 hours.   BUN 12 8 - 23 mg/dL   Creatinine, Ser 9.51 0.44 - 1.00 mg/dL   Calcium  8.8 (L) 8.9 - 10.3 mg/dL   GFR, Estimated >39 >39 mL/min    Comment: (NOTE) Calculated using the CKD-EPI Creatinine Equation (2021)    Anion gap 10 5 - 15    Comment: Performed at [facility], [address] [place], [place] [address]  CBC      Status: Abnormal   Collection Time: 08/18/23  4:32 AM  Result Value Ref Range   WBC 7.0 4.0 - 10.5 K/uL   RBC 3.42 (L) 3.87 - 5.11 MIL/uL   Hemoglobin 11.4 (L) 12.0 - 15.0 g/dL   HCT 65.7 (L) 63.9 - 53.9 %   MCV 100.0 80.0 - 100.0 fL   MCH 33.3 26.0 - 34.0 pg   MCHC 33.3 30.0 - 36.0 g/dL   RDW 87.6 88.4 - 84.4 %   Platelets 230 150 - 400 K/uL   nRBC 0.0 0.0 - 0.2 %    Comment: Performed at [facility], [address] [place], [place] [address]  Magnesium     Status: None   Collection Time: 08/18/23  4:32 AM  Result Value Ref Range   Magnesium 1.9 1.7 - 2.4 mg/dL    Comment: Performed at [facility], [address] [place], [place] [address]  Urinalysis, Complete     Status: Abnormal   Collection Time: 11/05/23  2:06 PM  Result Value Ref Range   Specific Gravity, UA 1.015 1.005 - 1.030   pH, UA 6.5 5.0 - 7.5   Color, UA Yellow Yellow   Appearance Ur Hazy (A) Clear   Leukocytes,UA Trace (A) Negative   Protein,UA Negative Negative/Trace   Glucose, UA Negative Negative   Ketones, UA Negative Negative   RBC, UA 3+ (A) Negative   Bilirubin, UA Negative Negative   Urobilinogen, Ur 0.2 0.2 - 1.0 mg/dL   Nitrite, UA Negative Negative   Microscopic Examination See below:   Microscopic Examination     Status: Abnormal   Collection Time: 11/05/23  2:06 PM   Urine  Result Value Ref Range   WBC, UA 6-10 (A) 0 - 5 /  hpf   RBC, Urine >30 (A) 0 - 2 /hpf   Epithelial Cells (non renal) 0-10 0 - 10 /hpf   Crystals Present (A) N/A   Crystal Type Amorphous Sediment N/A   Mucus, UA Present (A) Not Estab.   Bacteria, UA Moderate (A) None seen/Few  CULTURE, URINE COMPREHENSIVE     Status: None   Collection Time: 11/05/23  3:47 PM   Specimen: Urine   UR  Result Value Ref Range   Urine Culture, Comprehensive Final report    Organism ID, Bacteria Comment     Comment: Mixed urogenital flora 25,000-50,000 colony forming units per mL        Assessment &  Plan Menopausal hot flushes Patient stable.  Well controlled with current therapy.   Continue current meds.   Tobacco use disorder Other chest pain SOB (shortness of breath) Patient stable.  Well controlled with current therapy.   Continue current meds.   Mixed hyperlipidemia Checking labs today.  Continue current therapy for lipid control. Will modify as needed based on labwork results.   -CMP w/eGFR -Lipid Panel  Vitamin D  deficiency, unspecified Other fatigue B12 deficiency due to diet Checking labs today.  Will continue supplements as needed.   - Vitamin D  - Vitamin B12 - TSH  Prediabetes A1C Continues to be in prediabetic ranges.  Will reassess at follow up after next lab check.  Patient counseled on dietary choices and verbalized understanding.   -CBC w/Diff -CMP w/eGFR -Hemoglobin A1C    Return in about 3 months (around 02/15/2024).   Total time spent: 20 minutes  ALAN CHRISTELLA ARRANT, FNP  11/15/2023   This document may have been prepared by [facility] Voice Recognition software and as such may include unintentional dictation errors.

## 2023-11-16 LAB — CMP14+EGFR
ALT: 44 IU/L — ABNORMAL HIGH (ref 0–32)
AST: 38 IU/L (ref 0–40)
Albumin: 4.8 g/dL (ref 3.9–4.9)
Alkaline Phosphatase: 92 IU/L (ref 44–121)
BUN/Creatinine Ratio: 14 (ref 12–28)
BUN: 11 mg/dL (ref 8–27)
Bilirubin Total: <0.2 mg/dL (ref 0.0–1.2)
CO2: 25 mmol/L (ref 20–29)
Calcium: 10 mg/dL (ref 8.7–10.3)
Chloride: 101 mmol/L (ref 96–106)
Creatinine, Ser: 0.8 mg/dL (ref 0.57–1.00)
Globulin, Total: 2.5 g/dL (ref 1.5–4.5)
Glucose: 79 mg/dL (ref 70–99)
Potassium: 4.3 mmol/L (ref 3.5–5.2)
Sodium: 141 mmol/L (ref 134–144)
Total Protein: 7.3 g/dL (ref 6.0–8.5)
eGFR: 81 mL/min/{1.73_m2} (ref >59)

## 2023-11-16 LAB — HEMOGLOBIN A1C
Est. average glucose Bld gHb Est-mCnc: 123 mg/dL
Hgb A1c MFr Bld: 5.9 % — ABNORMAL HIGH (ref 4.8–5.6)

## 2023-11-16 LAB — CBC WITH DIFFERENTIAL/PLATELET
Basophils Absolute: 0.1 10*3/uL (ref 0.0–0.2)
Basos: 1 %
EOS (ABSOLUTE): 0.1 10*3/uL (ref 0.0–0.4)
Eos: 2 %
Hematocrit: 47.7 % — ABNORMAL HIGH (ref 34.0–46.6)
Hemoglobin: 15 g/dL (ref 11.1–15.9)
Immature Grans (Abs): 0 10*3/uL (ref 0.0–0.1)
Immature Granulocytes: 0 %
Lymphocytes Absolute: 1.9 10*3/uL (ref 0.7–3.1)
Lymphs: 29 %
MCH: 31 pg (ref 26.6–33.0)
MCHC: 31.4 g/dL — ABNORMAL LOW (ref 31.5–35.7)
MCV: 99 fL — ABNORMAL HIGH (ref 79–97)
Monocytes Absolute: 0.6 10*3/uL (ref 0.1–0.9)
Monocytes: 9 %
Neutrophils Absolute: 4 10*3/uL (ref 1.4–7.0)
Neutrophils: 59 %
Platelets: 271 10*3/uL (ref 150–450)
RBC: 4.84 x10E6/uL (ref 3.77–5.28)
RDW: 11.3 % — ABNORMAL LOW (ref 11.7–15.4)
WBC: 6.7 10*3/uL (ref 3.4–10.8)

## 2023-11-16 LAB — LIPID PANEL
Chol/HDL Ratio: 2.1 ratio (ref 0.0–4.4)
Cholesterol, Total: 149 mg/dL (ref 100–199)
HDL: 71 mg/dL (ref >39)
LDL Chol Calc (NIH): 62 mg/dL (ref 0–99)
Triglycerides: 84 mg/dL (ref 0–149)
VLDL Cholesterol Cal: 16 mg/dL (ref 5–40)

## 2023-11-16 LAB — TSH: TSH: 1.79 u[IU]/mL (ref 0.450–4.500)

## 2023-11-16 LAB — IRON,TIBC AND FERRITIN PANEL
Ferritin: 95 ng/mL (ref 15–150)
Iron Saturation: 34 % (ref 15–55)
Iron: 100 ug/dL (ref 27–139)
Total Iron Binding Capacity: 295 ug/dL (ref 250–450)
UIBC: 195 ug/dL (ref 118–369)

## 2023-11-16 LAB — VITAMIN B12: Vitamin B-12: 1514 pg/mL — ABNORMAL HIGH (ref 232–1245)

## 2023-11-16 LAB — VITAMIN D 25 HYDROXY (VIT D DEFICIENCY, FRACTURES): Vit D, 25-Hydroxy: 48.7 ng/mL (ref 30.0–100.0)

## 2023-11-19 ENCOUNTER — Ambulatory Visit
Admission: RE | Admit: 2023-11-19 | Discharge: 2023-11-19 | Disposition: A | Discharge status: Home / Self Care | Source: Ambulatory Visit | Attending: Physician Assistant | Admitting: Physician Assistant

## 2023-11-19 DIAGNOSIS — N200 Calculus of kidney: Secondary | ICD-10-CM | POA: Diagnosis not present

## 2023-11-19 DIAGNOSIS — R310 Gross hematuria: Secondary | ICD-10-CM | POA: Insufficient documentation

## 2023-11-19 MED ORDER — IOHEXOL 300 MG/ML  SOLN
100.0000 mL | Freq: Once | INTRAMUSCULAR | Status: AC | PRN
  Administered 2023-11-19: 100 mL via INTRAVENOUS

## 2023-11-21 ENCOUNTER — Other Ambulatory Visit: Payer: Self-pay | Admitting: Physical Medicine and Rehabilitation

## 2023-11-21 DIAGNOSIS — M5416 Radiculopathy, lumbar region: Secondary | ICD-10-CM

## 2023-11-26 DIAGNOSIS — R5383 Other fatigue: Secondary | ICD-10-CM | POA: Diagnosis not present

## 2023-11-26 DIAGNOSIS — D649 Anemia, unspecified: Secondary | ICD-10-CM | POA: Diagnosis not present

## 2023-11-26 DIAGNOSIS — I2510 Atherosclerotic heart disease of native coronary artery without angina pectoris: Secondary | ICD-10-CM | POA: Diagnosis not present

## 2023-11-26 DIAGNOSIS — Z720 Tobacco use: Secondary | ICD-10-CM | POA: Diagnosis not present

## 2023-11-26 DIAGNOSIS — Z955 Presence of coronary angioplasty implant and graft: Secondary | ICD-10-CM | POA: Diagnosis not present

## 2023-11-26 DIAGNOSIS — K219 Gastro-esophageal reflux disease without esophagitis: Secondary | ICD-10-CM | POA: Diagnosis not present

## 2023-11-26 DIAGNOSIS — F17200 Nicotine dependence, unspecified, uncomplicated: Secondary | ICD-10-CM | POA: Diagnosis not present

## 2023-11-26 DIAGNOSIS — E782 Mixed hyperlipidemia: Secondary | ICD-10-CM | POA: Diagnosis not present

## 2023-12-06 ENCOUNTER — Ambulatory Visit (INDEPENDENT_AMBULATORY_CARE_PROVIDER_SITE_OTHER): Admitting: Urology

## 2023-12-06 VITALS — BP 136/87 | HR 102 | Ht 60.0 in | Wt 97.1 lb

## 2023-12-06 DIAGNOSIS — R310 Gross hematuria: Secondary | ICD-10-CM

## 2023-12-06 DIAGNOSIS — N952 Postmenopausal atrophic vaginitis: Secondary | ICD-10-CM

## 2023-12-06 LAB — URINALYSIS, COMPLETE
Bilirubin, UA: NEGATIVE
Glucose, UA: NEGATIVE
Ketones, UA: NEGATIVE
Nitrite, UA: NEGATIVE
Protein,UA: NEGATIVE
Specific Gravity, UA: <1.005 — ABNORMAL LOW (ref 1.005–1.030)
Urobilinogen, Ur: 0.2 mg/dL (ref 0.2–1.0)
pH, UA: 6 (ref 5.0–7.5)

## 2023-12-06 LAB — MICROSCOPIC EXAMINATION

## 2023-12-06 MED ORDER — ESTRADIOL 0.1 MG/GM VA CREA: TOPICAL_CREAM | VAGINAL | 12 refills | Status: DC

## 2023-12-06 NOTE — Patient Instructions (Signed)
Estrogen Cream Instruction  Discard applicator  Apply pea sized amount to tip of finger to urethra before bed. Wash hands well after application. Use Monday, Wednesday and Friday  

## 2023-12-07 NOTE — Progress Notes (Signed)
   12/06/23  CC:  Chief Complaint  Patient presents with   Cysto    HPI: 68 year old female with episodes of gross hematuria who presents today for follow-up.  She reports that when she was in the hospital, nurses had a difficult time finding her urethral meatus.  Please see previous notes for details.  She has not had an episode of bleeding in a few weeks.  She did undergo just a CT urogram on 11/19/2023 which showed only a small nonobstructing stones bilaterally.  Blood pressure 136/87, pulse (!) 102, height 5' (1.524 m), weight 97 lb 1 oz (44 kg). NED. A&Ox3.   No respiratory distress   Abd soft, NT, ND Normal external genitalia.  Vaginal/introital stenosis limiting visualization of the urethra.  There is also significant atrophic changes appreciated.  Cystoscopy Procedure Note  Patient identification was confirmed, informed consent was obtained, and patient was prepped using Betadine solution.  Lidocaine  jelly was administered per urethral meatus.    Procedure: - Flexible cystoscope introduced, without any difficulty.   - Thorough search of the bladder revealed:    normal urethral meatus    normal urothelium    no stones    no ulcers     no tumors    no urethral polyps    no trabeculation  - Ureteral orifices were normal in position and appearance.  Post-Procedure: - Patient tolerated the procedure well  Assessment/ Plan:  1. Gross hematuria (Primary) No obvious underlying etiology.  Cystoscopy today along with CT urogram both unremarkable - Urinalysis, Complete  2. Vaginal atrophy Would likely benefit from topical estrogen cream, discussed risk and benefits.  She would like to pursue this.  Pea-sized amount per urethral meatus on a on a 3x weekly basis. - estradiol (ESTRACE) 0.1 MG/GM vaginal cream; Estrogen Cream Instruction Discard applicator Apply pea sized amount to tip of finger to urethra before bed. Wash hands well after application. Use Monday, Wednesday  and Friday  Dispense: 42.5 g; Refill: 12  F/u annually or sooner as needed  Dustin Gimenez, MD

## 2023-12-13 ENCOUNTER — Ambulatory Visit
Admission: RE | Admit: 2023-12-13 | Discharge: 2023-12-13 | Disposition: A | Discharge status: Home / Self Care | Source: Ambulatory Visit | Attending: Physical Medicine and Rehabilitation | Admitting: Physical Medicine and Rehabilitation

## 2023-12-13 DIAGNOSIS — M5416 Radiculopathy, lumbar region: Secondary | ICD-10-CM

## 2023-12-13 DIAGNOSIS — M47816 Spondylosis without myelopathy or radiculopathy, lumbar region: Secondary | ICD-10-CM | POA: Diagnosis not present

## 2023-12-15 ENCOUNTER — Other Ambulatory Visit: Payer: Self-pay

## 2023-12-15 ENCOUNTER — Emergency Department
Admission: EM | Admit: 2023-12-15 | Discharge: 2023-12-15 | Disposition: A | Discharge status: Home / Self Care | Attending: Emergency Medicine | Admitting: Emergency Medicine

## 2023-12-15 DIAGNOSIS — N3090 Cystitis, unspecified without hematuria: Secondary | ICD-10-CM | POA: Insufficient documentation

## 2023-12-15 DIAGNOSIS — Z87442 Personal history of urinary calculi: Secondary | ICD-10-CM | POA: Insufficient documentation

## 2023-12-15 DIAGNOSIS — R300 Dysuria: Secondary | ICD-10-CM | POA: Diagnosis present

## 2023-12-15 LAB — COMPREHENSIVE METABOLIC PANEL WITH GFR
ALT: 40 U/L (ref 0–44)
AST: 30 U/L (ref 15–41)
Albumin: 4 g/dL (ref 3.5–5.0)
Alkaline Phosphatase: 62 U/L (ref 38–126)
Anion gap: 7 (ref 5–15)
BUN: 21 mg/dL (ref 8–23)
CO2: 25 mmol/L (ref 22–32)
Calcium: 9.6 mg/dL (ref 8.9–10.3)
Chloride: 104 mmol/L (ref 98–111)
Creatinine, Ser: 0.71 mg/dL (ref 0.44–1.00)
GFR, Estimated: >60 mL/min (ref >60)
Glucose, Bld: 98 mg/dL (ref 70–99)
Potassium: 4 mmol/L (ref 3.5–5.1)
Sodium: 136 mmol/L (ref 135–145)
Total Bilirubin: 0.4 mg/dL (ref 0.0–1.2)
Total Protein: 7.3 g/dL (ref 6.5–8.1)

## 2023-12-15 LAB — CBC
HCT: 44.2 % (ref 36.0–46.0)
Hemoglobin: 14.4 g/dL (ref 12.0–15.0)
MCH: 31.5 pg (ref 26.0–34.0)
MCHC: 32.6 g/dL (ref 30.0–36.0)
MCV: 96.7 fL (ref 80.0–100.0)
Platelets: 269 10*3/uL (ref 150–400)
RBC: 4.57 MIL/uL (ref 3.87–5.11)
RDW: 12.7 % (ref 11.5–15.5)
WBC: 7.6 10*3/uL (ref 4.0–10.5)
nRBC: 0 % (ref 0.0–0.2)

## 2023-12-15 LAB — URINALYSIS, ROUTINE W REFLEX MICROSCOPIC
Bilirubin Urine: NEGATIVE
Glucose, UA: NEGATIVE mg/dL
Ketones, ur: NEGATIVE mg/dL
Nitrite: NEGATIVE
Protein, ur: 30 mg/dL — AB
Specific Gravity, Urine: 1.011 (ref 1.005–1.030)
Squamous Epithelial / HPF: 0 /HPF (ref 0–5)
WBC, UA: >50 WBC/hpf (ref 0–5)
pH: 5 (ref 5.0–8.0)

## 2023-12-15 MED ORDER — CEFTRIAXONE SODIUM 1 G IJ SOLR
1.0000 g | Freq: Once | INTRAMUSCULAR | Status: AC
  Administered 2023-12-15: 1 g via INTRAMUSCULAR
  Filled 2023-12-15: qty 10

## 2023-12-15 MED ORDER — CEPHALEXIN 500 MG PO CAPS: 500.0000 mg | ORAL_CAPSULE | Freq: Two times a day (BID) | ORAL | 0 refills | Status: DC

## 2023-12-15 MED ORDER — LIDOCAINE HCL (PF) 1 % IJ SOLN
INTRAMUSCULAR | Status: AC
  Administered 2023-12-15: 2.1 mL
  Filled 2023-12-15: qty 5

## 2023-12-15 NOTE — ED Provider Notes (Signed)
 [facility] Provider Note    Event Date/Time   First MD Initiated Contact with Patient 12/15/23 1544     (approximate)   History   Chief Complaint: Dysuria   HPI  Brooke Jenkins is a 68 y.o. female with a history of kidney stones who comes ED complaining of dysuria, urinary frequency for the past 2 weeks.  Reports that on the 15th she had a cystoscopy which was uneventful.  3 days later she started having dysuria and hematuria, and symptoms have persisted and become worse recently.  Denies fever or chills, no flank pain or abdominal pain.  No vomiting.        Past Medical History:  Diagnosis Date   Acute upper respiratory infection 03/03/2012   Anxiety    Arthritis    Cataracts, bilateral    Change in bowel habits 01/02/2023   Chronic hepatitis C (HCC) 04/25/2010   Depression    History of hepatitis    History of nephrolithiasis 03/28/2016   Insomnia    Kidney stones    Tinnitus     Current Outpatient Rx   Order #: [id number] Class: Normal   Order #: [id number] Class: Historical Med   Order #: [id number] Class: Historical Med   Order #: [id number] Class: Historical Med   Order #: [id number] Class: Historical Med   Order #: [id number] Class: Historical Med   Order #: [id number] Class: Historical Med   Order #: [id number] Class: Normal   Order #: [id number] Class: Historical Med   Order #: [id number] Class: Normal   Order #: [id number] Class: Historical Med   Order #: [id number] Class: Historical Med   Order #: [id number] Class: Historical Med   Order #: [id number] Class: Historical Med   Order #: [id number] Class: Normal   Order #: [id number] Class: Normal   Order #: [id number] Class: Historical Med    Past Surgical History:  Procedure Laterality Date   CHOLECYSTECTOMY     COLONOSCOPY WITH PROPOFOL  N/A 05/19/2019   Procedure: COLONOSCOPY WITH PROPOFOL ;  Surgeon: Selena Daily, MD;  Location: [facility];  Service: Gastroenterology;  Laterality: N/A;    CORONARY STENT INTERVENTION N/A 08/16/2023   Procedure: CORONARY STENT INTERVENTION;  Surgeon: Antonette Batters, MD;  Location: [facility] INVASIVE CV LAB;  Service: Cardiovascular;  Laterality: N/A;   ESOPHAGOGASTRODUODENOSCOPY (EGD) WITH PROPOFOL  N/A 05/19/2019   Procedure: ESOPHAGOGASTRODUODENOSCOPY (EGD) WITH PROPOFOL ;  Surgeon: Selena Daily, MD;  Location: [facility] ENDOSCOPY;  Service: Gastroenterology;  Laterality: N/A;   EXTRACORPOREAL SHOCK WAVE LITHOTRIPSY Left 11/03/2021   Procedure: EXTRACORPOREAL SHOCK WAVE LITHOTRIPSY (ESWL);  Surgeon: Geraline Knapp, MD;  Location: [facility] ORS;  Service: Urology;  Laterality: Left;   EXTRACORPOREAL SHOCK WAVE LITHOTRIPSY Left 10/27/2021   Procedure: EXTRACORPOREAL SHOCK WAVE LITHOTRIPSY (ESWL);  Surgeon: Dustin Gimenez, MD;  Location: [facility] ORS;  Service: Urology;  Laterality: Left;   HEMORROIDECTOMY     KIDNEY STONE SURGERY     LEFT HEART CATH AND CORONARY ANGIOGRAPHY Left 08/16/2023   Procedure: LEFT HEART CATH AND CORONARY ANGIOGRAPHY;  Surgeon: Cherrie Cornwall, MD;  Location: [facility] INVASIVE CV LAB;  Service: Cardiovascular;  Laterality: Left;   LOWER EXTREMITY ANGIOGRAPHY Right 08/16/2023   Procedure: Lower Extremity Angiography;  Surgeon: Celso College, MD;  Location: [facility] INV[facility] CV [facility];  Service: Cardiovascular;  Laterality: Right;   TEMPOROMANDIBULAR JOINT SURGERY      Physical Exam   Triage Vital Signs: ED Triage Vitals  Encounter Vitals Group     BP 12/15/23 1437 123/78     Systolic BP Percentile --  Diastolic BP Percentile --      Pulse Rate 12/15/23 1437 88     Resp 12/15/23 1437 20     Temp 12/15/23 1437 98.8 F (37.1 C)     Temp src --      SpO2 12/15/23 1437 97 %     Weight --      Height --      Head Circumference --      Peak Flow --      Pain Score 12/15/23 1444 9     Pain Loc --      Pain Education --      Exclude from Growth Chart --     Most recent vital signs: Vitals:   12/15/23 1437  BP: 123/78   Pulse: 88  Resp: 20  Temp: 98.8 F (37.1 C)  SpO2: 97%    General: Awake, no distress.  CV:  Good peripheral perfusion.  Regular rate Resp:  Normal effort.  Abd:  No distention.  Soft with suprapubic tenderness, no CVA tenderness Other:  Moist oral mucosa   ED Results / Procedures / Treatments   Labs (all labs ordered are listed, but only abnormal results are displayed) Labs Reviewed  URINALYSIS, ROUTINE W REFLEX MICROSCOPIC - Abnormal; Notable for the following components:      Result Value   Color, Urine YELLOW (*)    APPearance TURBID (*)    Hgb urine dipstick MODERATE (*)    Protein, ur 30 (*)    Leukocytes,Ua LARGE (*)    Bacteria, UA MANY (*)    Non Squamous Epithelial PRESENT (*)    All other components within normal limits  URINE CULTURE  COMPREHENSIVE METABOLIC PANEL WITH GFR  CBC     EKG    RADIOLOGY    PROCEDURES:  Procedures   MEDICATIONS ORDERED IN ED: Medications  cefTRIAXone (ROCEPHIN) injection 1 g (1 g Intramuscular Given 12/15/23 1629)  lidocaine  (PF) (XYLOCAINE ) 1 % injection (2.1 mLs  Given 12/15/23 1629)     IMPRESSION / MDM / ASSESSMENT AND PLAN / ED COURSE  I reviewed the triage vital signs and the nursing notes.  DDx: UTI, AKI, electrolyte derangement, postprocedural pain,  Patient's presentation is most consistent with acute presentation with potential threat to life or bodily function.  Patient presents with symptoms of urethritis and cystitis after recent cystoscopy.  Vital signs are normal, she is nontoxic, not septic.  Doubt pyelonephritis or obstructing kidney stone.  Serum labs unremarkable.  Urinalysis consistent with UTI.  Will start on course of antibiotics.       FINAL CLINICAL IMPRESSION(S) / ED DIAGNOSES   Final diagnoses:  Cystitis     Rx / DC Orders   ED Discharge Orders          Ordered    cephALEXin  (KEFLEX ) 500 MG capsule  2 times daily        12/15/23 1642             Note:  This  document was prepared using Dragon voice recognition software and may include unintentional dictation errors.   Jacquie Maudlin, MD 12/15/23 [phone]

## 2023-12-15 NOTE — ED Notes (Signed)
 Pharmacist consulted about patient's allergies and medication order.

## 2023-12-15 NOTE — ED Triage Notes (Signed)
 Pt comes in vis pov with complaints of painful urination, frequent urination, and cloudy urine. Pt states that she had a cystoscopy done on May 15th. Pt had blood in urine right after, but no blood since. Pt has a history of kidney stones and was told by her urologist that she has multiple small kidney stones the week before last. Pt is alert and oriented x4, with no signs of acute distress at this time.

## 2023-12-15 NOTE — ED Notes (Signed)
 Patient declined discharge vital signs.

## 2023-12-17 LAB — URINE CULTURE: Culture: >=100000 — AB

## 2023-12-19 ENCOUNTER — Telehealth: Payer: Self-pay | Admitting: Physician Assistant

## 2023-12-19 NOTE — Telephone Encounter (Signed)
 PT called and left Vm stating that she wanted you to know that after her cysto she ended up in the ER with a UTI.

## 2023-12-20 NOTE — Telephone Encounter (Signed)
 I am sorry to hear this.  I looked at her chart, and it looks like the Keflex  she was prescribed was culture appropriate based on the bacteria she grew.  I do not need to see her in clinic unless her symptoms have persisted despite antibiotics.

## 2023-12-21 NOTE — Telephone Encounter (Signed)
 Pt informed. Pt states she is feeling better but is still having symptoms. Pt states her last day on the antibiotic is tomorrow. Pt was advise to finish the course of antibiotic and if she's still having symptoms to go to urgent care or call us  on Monday. Pt voiced understanding.

## 2024-01-02 DIAGNOSIS — M170 Bilateral primary osteoarthritis of knee: Secondary | ICD-10-CM | POA: Diagnosis not present

## 2024-02-03 NOTE — Assessment & Plan Note (Signed)
 Checking labs today.  Continue current therapy for lipid control. Will modify as needed based on labwork results.   -CMP w/eGFR -Lipid Panel

## 2024-02-03 NOTE — Assessment & Plan Note (Signed)
 Patient stable.  Well controlled with current therapy.   Continue current meds.

## 2024-02-07 DIAGNOSIS — M5380 Other specified dorsopathies, site unspecified: Secondary | ICD-10-CM | POA: Diagnosis not present

## 2024-02-07 DIAGNOSIS — M170 Bilateral primary osteoarthritis of knee: Secondary | ICD-10-CM | POA: Diagnosis not present

## 2024-02-07 DIAGNOSIS — M47816 Spondylosis without myelopathy or radiculopathy, lumbar region: Secondary | ICD-10-CM | POA: Diagnosis not present

## 2024-02-07 DIAGNOSIS — M5416 Radiculopathy, lumbar region: Secondary | ICD-10-CM | POA: Diagnosis not present

## 2024-02-07 DIAGNOSIS — M62830 Muscle spasm of back: Secondary | ICD-10-CM | POA: Diagnosis not present

## 2024-02-07 DIAGNOSIS — M7061 Trochanteric bursitis, right hip: Secondary | ICD-10-CM | POA: Diagnosis not present

## 2024-02-11 ENCOUNTER — Other Ambulatory Visit: Payer: Self-pay | Admitting: Family

## 2024-02-11 DIAGNOSIS — N951 Menopausal and female climacteric states: Secondary | ICD-10-CM

## 2024-02-11 DIAGNOSIS — E782 Mixed hyperlipidemia: Secondary | ICD-10-CM

## 2024-02-11 DIAGNOSIS — F17200 Nicotine dependence, unspecified, uncomplicated: Secondary | ICD-10-CM

## 2024-02-11 DIAGNOSIS — R0602 Shortness of breath: Secondary | ICD-10-CM

## 2024-02-11 DIAGNOSIS — R0789 Other chest pain: Secondary | ICD-10-CM

## 2024-03-12 DIAGNOSIS — H2513 Age-related nuclear cataract, bilateral: Secondary | ICD-10-CM | POA: Diagnosis not present

## 2024-03-12 DIAGNOSIS — H40033 Anatomical narrow angle, bilateral: Secondary | ICD-10-CM | POA: Diagnosis not present

## 2024-03-13 DIAGNOSIS — M797 Fibromyalgia: Secondary | ICD-10-CM | POA: Diagnosis not present

## 2024-03-13 DIAGNOSIS — G8929 Other chronic pain: Secondary | ICD-10-CM | POA: Diagnosis not present

## 2024-03-13 DIAGNOSIS — K5909 Other constipation: Secondary | ICD-10-CM | POA: Diagnosis not present

## 2024-03-13 DIAGNOSIS — M6289 Other specified disorders of muscle: Secondary | ICD-10-CM | POA: Diagnosis not present

## 2024-03-13 DIAGNOSIS — D126 Benign neoplasm of colon, unspecified: Secondary | ICD-10-CM | POA: Diagnosis not present

## 2024-03-13 DIAGNOSIS — K644 Residual hemorrhoidal skin tags: Secondary | ICD-10-CM | POA: Diagnosis not present

## 2024-03-13 DIAGNOSIS — M545 Low back pain, unspecified: Secondary | ICD-10-CM | POA: Diagnosis not present

## 2024-03-14 ENCOUNTER — Other Ambulatory Visit: Payer: Self-pay

## 2024-03-14 DIAGNOSIS — F17200 Nicotine dependence, unspecified, uncomplicated: Secondary | ICD-10-CM

## 2024-03-14 DIAGNOSIS — E782 Mixed hyperlipidemia: Secondary | ICD-10-CM

## 2024-03-14 DIAGNOSIS — N951 Menopausal and female climacteric states: Secondary | ICD-10-CM

## 2024-03-14 DIAGNOSIS — R0602 Shortness of breath: Secondary | ICD-10-CM

## 2024-03-14 DIAGNOSIS — R0789 Other chest pain: Secondary | ICD-10-CM

## 2024-03-14 MED ORDER — ROSUVASTATIN CALCIUM 20 MG PO TABS: 20.0000 mg | ORAL_TABLET | Freq: Every day | ORAL | 1 refills | Status: DC

## 2024-04-15 ENCOUNTER — Ambulatory Visit (HOSPITAL_BASED_OUTPATIENT_CLINIC_OR_DEPARTMENT_OTHER): Admitting: Student in an Organized Health Care Education/Training Program

## 2024-04-15 ENCOUNTER — Telehealth: Payer: Self-pay

## 2024-04-15 ENCOUNTER — Encounter: Payer: Self-pay | Admitting: Student in an Organized Health Care Education/Training Program

## 2024-04-15 ENCOUNTER — Other Ambulatory Visit: Payer: Self-pay | Admitting: Student in an Organized Health Care Education/Training Program

## 2024-04-15 ENCOUNTER — Ambulatory Visit
Admission: RE | Admit: 2024-04-15 | Discharge: 2024-04-15 | Disposition: A | Discharge status: Home / Self Care | Source: Ambulatory Visit | Attending: Student in an Organized Health Care Education/Training Program | Admitting: Student in an Organized Health Care Education/Training Program

## 2024-04-15 VITALS — BP 104/70 | HR 77 | Temp 98.2°F | Ht 60.0 in | Wt 98.0 lb

## 2024-04-15 DIAGNOSIS — R52 Pain, unspecified: Secondary | ICD-10-CM

## 2024-04-15 DIAGNOSIS — G8929 Other chronic pain: Secondary | ICD-10-CM

## 2024-04-15 DIAGNOSIS — G894 Chronic pain syndrome: Secondary | ICD-10-CM | POA: Insufficient documentation

## 2024-04-15 DIAGNOSIS — M5416 Radiculopathy, lumbar region: Secondary | ICD-10-CM | POA: Insufficient documentation

## 2024-04-15 DIAGNOSIS — M47816 Spondylosis without myelopathy or radiculopathy, lumbar region: Secondary | ICD-10-CM

## 2024-04-15 DIAGNOSIS — M4726 Other spondylosis with radiculopathy, lumbar region: Secondary | ICD-10-CM | POA: Diagnosis not present

## 2024-04-15 DIAGNOSIS — M47812 Spondylosis without myelopathy or radiculopathy, cervical region: Secondary | ICD-10-CM | POA: Insufficient documentation

## 2024-04-15 NOTE — Progress Notes (Signed)
 Safety precautions to be maintained throughout the outpatient stay will include: orient to surroundings, keep bed in low position, maintain call bell within reach at all times, provide assistance with transfer out of bed and ambulation.

## 2024-04-15 NOTE — Telephone Encounter (Signed)
 Dr Marcelino would like cardiac clearance to stop Brilinta  5-7 days prior to Peripheral nerve stimulator implant. Okay to bridge with 81 mg aspirin ?  Thank you, Nathanel

## 2024-04-15 NOTE — Progress Notes (Signed)
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 of alcohol use. Tobacco:  reports that she has quit smoking. Her smoking use included cigarettes. She has a 10 pack-year smoking history. She has never used smokeless tobacco. Medical:  has a past medical history of Acute upper respiratory infection (03/03/2012), Anxiety, Arthritis, Cataracts, bilateral, Change in bowel habits (01/02/2023), Chronic hepatitis C (HCC) (04/25/2010), Depression, History of hepatitis, History of nephrolithiasis (03/28/2016), Insomnia, Kidney stones, and Tinnitus. Family: family history includes Alzheimer's disease in her mother; Arthritis in her maternal grandmother; Dementia in her  father and mother; Migraines in her maternal grandfather and mother; Other in her father and mother; Parkinson's disease in her mother.  Past Surgical History:  Procedure Laterality Date   CHOLECYSTECTOMY     COLONOSCOPY WITH PROPOFOL  N/A 05/19/2019   Procedure: COLONOSCOPY WITH PROPOFOL ;  Surgeon: Unk Corinn Skiff, MD;  Location: [facility];  Service: Gastroenterology;  Laterality: N/A;   CORONARY STENT INTERVENTION N/A 08/16/2023   Procedure: CORONARY STENT INTERVENTION;  Surgeon: Florencio Cara BIRCH, MD;  Location: [facility] INVASIVE CV LAB;  Service: Cardiovascular;  Laterality: N/A;   ESOPHAGOGASTRODUODENOSCOPY (EGD) WITH PROPOFOL  N/A 05/19/2019   Procedure: ESOPHAGOGASTRODUODENOSCOPY (EGD) WITH PROPOFOL ;  Surgeon: Unk Corinn Skiff, MD;  Location: [facility] ENDOSCOPY;  Service: Gastroenterology;  Laterality: N/A;   EXTRACORPOREAL SHOCK WAVE LITHOTRIPSY Left 11/03/2021   Procedure: EXTRACORPOREAL SHOCK WAVE LITHOTRIPSY (ESWL);  Surgeon: Twylla Glendia BROCKS, MD;  Location: [facility] ORS;  Service: Urology;  Laterality: Left;   EXTRACORPOREAL SHOCK WAVE LITHOTRIPSY Left 10/27/2021   Procedure: EXTRACORPOREAL SHOCK WAVE LITHOTRIPSY (ESWL);  Surgeon: Penne Knee, MD;  Location: [facility] ORS;  Service: Urology;  Laterality: Left;   HEMORROIDECTOMY     KIDNEY STONE SURGERY  LEFT HEART CATH AND CORONARY ANGIOGRAPHY Left 08/16/2023   Procedure: LEFT HEART CATH AND CORONARY ANGIOGRAPHY;  Surgeon: Fernand Denyse LABOR, MD;  Location: [facility] IN[facility] CV [facility];  Service: Cardiovascular;  Laterality: Left;   LOWER EXTREMITY ANGIOGRAPHY Right 08/16/2023   Procedure: Lower Extremity Angiography;  Surgeon: Marea Selinda RAMAN, MD;  Location: [facility];  Service: Cardiovascular;  Laterality: Right;   TEMPOROMANDIBULAR JOINT SURGERY     Active Ambulatory Problems    Diagnosis Date Noted   Acute midline low back pain without sciatica 04/11/2011   ADD (attention deficit disorder) 12/10/2015   Anxiety disorder  12/26/2013   Chondromalacia of knee 11/06/2017   Chronic idiopathic constipation 12/07/2015   Depression, major, recurrent, in partial remission 12/07/2015   Depressive disorder 04/25/2010   DDD (degenerative disc disease), cervical 01/12/2016   GERD (gastroesophageal reflux disease) 12/07/2015   Complete rupture of rotator cuff 10/06/2010   Menopausal hot flushes 05/08/2013   Postmenopausal HRT (hormone replacement therapy) 05/08/2013   Neck pain 04/11/2011   Nephrolithiasis 03/28/2016   Nicotine dependence, uncomplicated 12/07/2015   Osteoarthritis, hand 02/12/2013   Post-menopausal atrophic vaginitis 10/18/2018   Seasonal allergic rhinitis due to pollen 11/13/2017   Tinnitus, bilateral 01/12/2016   Tobacco use disorder 11/05/2018   Nausea    Weight loss 02/26/2020   Hx of colonic polyps    Insomnia 12/07/2015   Migraine without aura and without status migrainosus, not intractable 08/28/2019   Chronic midline thoracic back pain 10/29/2017   Hemorrhoids, external without complications 01/02/2023   Pelvic floor dysfunction 01/02/2023   Other chest pain 08/09/2023   SOB (shortness of breath) 08/09/2023   Chest pain due to myocardial ischemia 08/09/2023   Mixed hyperlipidemia 08/09/2023   Unstable angina (HCC) 08/09/2023   S/P drug eluting coronary stent placement 08/16/2023   Tobacco use 08/16/2023   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   Resolved Ambulatory Problems    Diagnosis Date Noted   Acute upper respiratory infection 03/03/2012   Chronic hepatitis C (HCC) 04/25/2010   Lower urinary tract infectious disease 05/08/2013   Change in bowel habits 01/02/2023   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 In no apparent acute distress Vitals:   04/15/24 1304  BP: 104/70  Pulse: 77  Temp: 98.2 F (36.8 C)  SpO2: 98%  Weight: 98 lb (44.5 kg)  Height: 5' (1.524 m)   BMI Assessment: Estimated body mass index is 19.14 kg/m as calculated from the following:   Height as of this encounter: 5' (1.524 m).   Weight as of this encounter: 98 lb (44.5 kg).  BMI interpretation table: BMI level Category Range association with higher incidence of chronic pain  <18 kg/m2 Underweight   18.5-24.9 kg/m2 Ideal body weight   25-29.9 kg/m2 Overweight Increased incidence by 20%  30-34.9 kg/m2 Obese (Class I) Increased incidence by 68%  35-39.9 kg/m2 Severe obesity (Class II) Increased incidence by 136%  >40 kg/m2 Extreme obesity (Class III) Increased incidence by 254%   Patient's current BMI Ideal Body weight  Body mass index is 19.14 kg/m.  Female patients must weigh at least 45.5 kg to calculate ideal body weight   BMI Readings from Last 4 Encounters:  04/15/24 19.14 kg/m  12/06/23 18.96 kg/m  11/15/23 19.02 kg/m  11/05/23 19.14 kg/m   Wt Readings from Last 4 Encounters:  04/15/24 98 lb (44.5 kg)  12/06/23 97 lb 1 oz (44 kg)  11/15/23 97 lb 6.4 oz (44.2 kg)  11/05/23 98 lb (44.5 kg)    Psych/Mental status: Alert, oriented x 3 (person, place, & time)       Eyes: PERLA Respiratory: No evidence of acute respiratory distress  Lumbar Spine Area Exam  Skin & Axial Inspection: No masses, redness, or swelling Alignment: Symmetrical Functional ROM: Pain restricted ROM affecting both sides Stability: No instability detected Muscle Tone/Strength: Functionally intact. No obvious neuro-muscular anomalies detected. Sensory (Neurological): Musculoskeletal pain pattern Palpation: Complains of area being tender to palpation       Provocative Tests:  Lumbar quadrant test (Kemp's  test): (+) bilaterally for facet joint pain.  Ambulation: Unassisted Gait: Relatively normal for age and body habitus Posture: WNL  Lower Extremity Exam    Side: Right lower extremity  Side: Left lower extremity  Stability: No instability observed          Stability: No instability observed          Skin & Extremity Inspection: Skin color, temperature, and hair growth are WNL. No peripheral edema or cyanosis. No masses, redness, swelling, asymmetry, or associated skin lesions. No contractures.  Skin & Extremity Inspection: Skin color, temperature, and hair growth are WNL. No peripheral edema or cyanosis. No masses, redness, swelling, asymmetry, or associated skin lesions. No contractures.  Functional ROM: Unrestricted ROM                  Functional ROM: Unrestricted ROM                  Muscle Tone/Strength: Functionally intact. No obvious neuro-muscular anomalies detected.  Muscle Tone/Strength: Functionally intact. No obvious neuro-muscular anomalies detected.  Sensory (Neurological): Unimpaired        Sensory (Neurological): Unimpaired        DTR: Patellar: deferred today Achilles: deferred today Plantar: deferred today  DTR: Patellar: deferred today Achilles: deferred today Plantar: deferred today  Palpation: No palpable anomalies  Palpation: No palpable anomalies    Assessment  Primary Diagnosis & Pertinent Problem List: The primary encounter diagnosis was Lumbar facet arthropathy. Diagnoses of Lumbar spondylosis, Chronic radicular lumbar pain, Lumbar radiculopathy, Chronic pain syndrome, and Cervical spondylosis were also pertinent to this visit.  Visit Diagnosis (New problems to examiner): 1. Lumbar facet arthropathy   2. Lumbar spondylosis   3. Chronic radicular lumbar pain   4. Lumbar radiculopathy   5. Chronic pain syndrome   6. Cervical spondylosis    Plan of Care (Initial workup plan)  The patient presents with chronic low back pain attributed to lumbar facet  arthropathy. She has previously undergone lumbar medial branch nerve blocks, lumbar radiofrequency ablation, and lumbar epidural steroid injections with only limited and diminishing benefit over time. Despite these interventions, she continues to experience persistent pain and functional impairment that significantly impacts her quality of life. Given the lack of durable response to conventional injections and ablation, we discussed advanced neuromodulation options. At this time, she is an appropriate candidate for Sprint peripheral nerve stimulation (PNS) targeting the L4 lumbar medial branch distribution. This modality offers a minimally invasive, time-limited treatment that can provide meaningful improvement in pain  and function for patients with refractory facet-mediated pain.  I explained to pt how Sprint peripheral nerve stimulation (PNS)  is typically considered for patients with chronic, localized pain that is not responding to conservative treatments such as medications, physical therapy, or injections. See patients significant injection hx above.   The SPRINT peripheral nerve stimulator is designed for short-term, percutaneous use (approximately 60 days) to modulate pain through targeted nerve stimulation. Unlike traditional permanent implants, SPRINT is temporary but can lead to long-lasting pain relief by altering pain signals.  We discussed the risks and benefits of peripheral nerve stimulation. Benefits: minimally invasive, does not require permanent implantation, can offer significant pain relief, improving function and quality of life, may reduce the need for long-term opioid use. The risks/challenges include (but not limited to):  infection or irritation at the stimulation site, discomfort from electrode placement, risk of incomplete pain relief or temporary relief post-removal, limited to short-term therapy, which may be a disadvantage in chronic, refractory cases  Of note, patient is on  Brilinta  and we will need cardiac clearance to discontinue it 3 days prior to her procedure.  She can take 81 aspirin  instead.   Referral Orders         Ambulatory referral to Psychology     Procedure Orders         Implantable Peripheral Nerve Stimulator       Provider-requested follow-up: Return in about 13 days (around 04/28/2024) for Right L4 SPRINT PNS, in clinic (PO Valium  5mg ).  Future Appointments  Date Time Provider Department Center  12/02/2024 10:20 AM Vaillancourt, Lucie, PA-C [facility] None   I discussed the assessment and treatment plan with the patient. The patient was provided an opportunity to ask questions and all were answered. The patient agreed with the plan and demonstrated an understanding of the instructions.  Patient advised to call back or seek an in-person evaluation if the symptoms or condition worsens.  I personally spent a total of 60 minutes in the care of the patient today including preparing to see the patient, getting/reviewing separately obtained history, performing a medically appropriate exam/evaluation, counseling and educating, placing orders, documenting clinical information in the EHR, independently interpreting results, and reivew lumbar spine under fluoroscopy.   Note by: Wallie Sherry, MD (TTS and AI technology used. I apologize for any typographical errors that were not detected and corrected.) Date: 04/15/2024; Time: 2:38 PM

## 2024-04-15 NOTE — Patient Instructions (Addendum)
 [facility] psych eval, fax results to us  SPRINT PNS L4 medial branch  ______________________________________________________________________    Procedure instructions  Stop blood-thinners ( we will send cardica clearance to Dr. Florencio)  Do not eat or drink fluids (other than water) for 6 hours before your procedure  No water for 2 hours before your procedure  Take your blood pressure medicine with a sip of water  Arrive 30 minutes before your appointment  If sedation is planned, bring suitable driver. [place], [place], & public transportation are NOT APPROVED)  Carefully read the Preparing for your procedure detailed instructions  If you have questions call us  at [phone]  Procedure appointments are for procedures only.   NO medication refills or new problem evaluations will be done on procedure days.   Only the scheduled, pre-approved procedure and side will be done.   ______________________________________________________________________

## 2024-04-29 ENCOUNTER — Other Ambulatory Visit: Payer: Self-pay | Admitting: Family

## 2024-05-30 ENCOUNTER — Emergency Department
Admission: EM | Admit: 2024-05-30 | Discharge: 2024-05-30 | Disposition: A | Discharge status: Home / Self Care | Attending: Emergency Medicine | Admitting: Emergency Medicine

## 2024-05-30 ENCOUNTER — Other Ambulatory Visit: Payer: Self-pay

## 2024-05-30 ENCOUNTER — Emergency Department

## 2024-05-30 DIAGNOSIS — R42 Dizziness and giddiness: Secondary | ICD-10-CM | POA: Insufficient documentation

## 2024-05-30 LAB — URINALYSIS, ROUTINE W REFLEX MICROSCOPIC
Bilirubin Urine: NEGATIVE
Glucose, UA: NEGATIVE mg/dL
Hgb urine dipstick: NEGATIVE
Ketones, ur: NEGATIVE mg/dL
Leukocytes,Ua: NEGATIVE
Nitrite: NEGATIVE
Protein, ur: NEGATIVE mg/dL
Specific Gravity, Urine: 1.013 (ref 1.005–1.030)
pH: 7 (ref 5.0–8.0)

## 2024-05-30 LAB — COMPREHENSIVE METABOLIC PANEL WITH GFR
ALT: 130 U/L — ABNORMAL HIGH (ref 0–44)
AST: 100 U/L — ABNORMAL HIGH (ref 15–41)
Albumin: 4.7 g/dL (ref 3.5–5.0)
Alkaline Phosphatase: 92 U/L (ref 38–126)
Anion gap: 12 (ref 5–15)
BUN: 8 mg/dL (ref 8–23)
CO2: 27 mmol/L (ref 22–32)
Calcium: 9.7 mg/dL (ref 8.9–10.3)
Chloride: 101 mmol/L (ref 98–111)
Creatinine, Ser: 0.66 mg/dL (ref 0.44–1.00)
GFR, Estimated: >60 mL/min (ref >60)
Glucose, Bld: 98 mg/dL (ref 70–99)
Potassium: 3.9 mmol/L (ref 3.5–5.1)
Sodium: 139 mmol/L (ref 135–145)
Total Bilirubin: 0.4 mg/dL (ref 0.0–1.2)
Total Protein: 7.7 g/dL (ref 6.5–8.1)

## 2024-05-30 LAB — CBC
HCT: 43 % (ref 36.0–46.0)
Hemoglobin: 13.8 g/dL (ref 12.0–15.0)
MCH: 32.3 pg (ref 26.0–34.0)
MCHC: 32.1 g/dL (ref 30.0–36.0)
MCV: 100.7 fL — ABNORMAL HIGH (ref 80.0–100.0)
Platelets: 279 K/uL (ref 150–400)
RBC: 4.27 MIL/uL (ref 3.87–5.11)
RDW: 12.6 % (ref 11.5–15.5)
WBC: 8.9 K/uL (ref 4.0–10.5)
nRBC: 0 % (ref 0.0–0.2)

## 2024-05-30 LAB — CBG MONITORING, ED: Glucose-Capillary: 95 mg/dL (ref 70–99)

## 2024-05-30 NOTE — ED Notes (Signed)
 Pt discharged to home, AVS reviewed and pt verbalized understanding.  Pt has no questions at this time.

## 2024-05-30 NOTE — ED Triage Notes (Signed)
 Patient states she was dizzy on Sunday and fell 6 times; denies dizziness since. Patient denies pain from falls.

## 2024-05-30 NOTE — ED Provider Notes (Signed)
 [facility] Provider Note    Event Date/Time   First MD Initiated Contact with Patient 05/30/24 1844     (approximate)   History   Dizziness   HPI  Brooke Jenkins is a 68 y.o. female who reports that 5 days ago she developed significant issues with dizziness which she describes as a vertigo-like sensation and difficulty carrying items which is atypical for her.  She reports chronic headaches.  No trauma or head injuries reported.  She reports this is improved somewhat over the last 2 days but she is concerned that she may have had a stroke, it runs in her family     Physical Exam   Triage Vital Signs: ED Triage Vitals [05/30/24 1822]  Encounter Vitals Group     BP (!) 146/92     Girls Systolic BP Percentile      Girls Diastolic BP Percentile      Boys Systolic BP Percentile      Boys Diastolic BP Percentile      Pulse Rate 89     Resp 20     Temp 98.3 F (36.8 C)     Temp Source Oral     SpO2 99 %     Weight 47.2 kg (104 lb)     Height 1.524 m (5')     Head Circumference      Peak Flow      Pain Score 0     Pain Loc      Pain Education      Exclude from Growth Chart     Most recent vital signs: Vitals:   05/30/24 1822 05/30/24 2000  BP: (!) 146/92 (!) 140/81  Pulse: 89 73  Resp: 20 17  Temp: 98.3 F (36.8 C)   SpO2: 99% 100%     General: Awake, no distress.  CV:  Good peripheral perfusion.  Resp:  Normal effort.  Abd:  No distention.  Other:  Cranial nerves II through XII are normal, normal strength in all extremities.  PERRLA, EOMI, no dysdiadochokinesis   ED Results / Procedures / Treatments   Labs (all labs ordered are listed, but only abnormal results are displayed) Labs Reviewed  COMPREHENSIVE METABOLIC PANEL WITH GFR - Abnormal; Notable for the following components:      Result Value   AST 100 (*)    ALT 130 (*)    All other components within normal limits  CBC - Abnormal; Notable for the following components:    MCV 100.7 (*)    All other components within normal limits  URINALYSIS, ROUTINE W REFLEX MICROSCOPIC - Abnormal; Notable for the following components:   Color, Urine YELLOW (*)    APPearance CLOUDY (*)    All other components within normal limits  CBG MONITORING, ED     EKG     RADIOLOGY MRI brain negative per radiology    PROCEDURES:  Critical Care performed:   Procedures   MEDICATIONS ORDERED IN ED: Medications - No data to display   IMPRESSION / MDM / ASSESSMENT AND PLAN / ED COURSE  I reviewed the triage vital signs and the nursing notes. Patient's presentation is most consistent with acute presentation with potential threat to life or bodily function.  Patient presents with symptoms as above, differential includes CVA/TIA, vertigo, electrolyte abnormality  Does seem as though symptoms are improving, neuroexam here is reassuring.  However given her description of symptoms we will send for MRI brain to rule out  CVA  Lab work reviewed and is reassuring  MRI is negative for stroke, patient is relieved, she will follow-up with her PCP closely        FINAL CLINICAL IMPRESSION(S) / ED DIAGNOSES   Final diagnoses:  Dizziness     Rx / DC Orders   ED Discharge Orders     None        Note:  This document was prepared using Dragon voice recognition software and may include unintentional dictation errors.   Arlander Charleston, MD 05/30/24 [phone]

## 2024-06-17 ENCOUNTER — Ambulatory Visit: Admitting: Family

## 2024-06-17 ENCOUNTER — Encounter: Payer: Self-pay | Admitting: Family

## 2024-06-17 VITALS — BP 116/78 | HR 85 | Ht 60.0 in | Wt 104.6 lb

## 2024-06-17 DIAGNOSIS — R5383 Other fatigue: Secondary | ICD-10-CM | POA: Diagnosis not present

## 2024-06-17 DIAGNOSIS — E782 Mixed hyperlipidemia: Secondary | ICD-10-CM

## 2024-06-17 DIAGNOSIS — E538 Deficiency of other specified B group vitamins: Secondary | ICD-10-CM

## 2024-06-17 DIAGNOSIS — I10 Essential (primary) hypertension: Secondary | ICD-10-CM | POA: Diagnosis not present

## 2024-06-17 DIAGNOSIS — N951 Menopausal and female climacteric states: Secondary | ICD-10-CM | POA: Diagnosis not present

## 2024-06-17 DIAGNOSIS — R42 Dizziness and giddiness: Secondary | ICD-10-CM | POA: Diagnosis not present

## 2024-06-17 DIAGNOSIS — E559 Vitamin D deficiency, unspecified: Secondary | ICD-10-CM | POA: Diagnosis not present

## 2024-06-17 DIAGNOSIS — R7303 Prediabetes: Secondary | ICD-10-CM | POA: Diagnosis not present

## 2024-06-17 MED ORDER — VEOZAH 45 MG PO TABS: 45.0000 mg | ORAL_TABLET | Freq: Every day | ORAL | 5 refills | Status: AC

## 2024-06-19 LAB — LIPID PANEL
Chol/HDL Ratio: 2.2 ratio (ref 0.0–4.4)
Cholesterol, Total: 184 mg/dL (ref 100–199)
HDL: 83 mg/dL (ref >39)
LDL Chol Calc (NIH): 82 mg/dL (ref 0–99)
Triglycerides: 112 mg/dL (ref 0–149)
VLDL Cholesterol Cal: 19 mg/dL (ref 5–40)

## 2024-06-19 LAB — CMP14+EGFR
ALT: 31 IU/L (ref 0–32)
AST: 35 IU/L (ref 0–40)
Albumin: 4.9 g/dL (ref 3.9–4.9)
Alkaline Phosphatase: 90 IU/L (ref 49–135)
BUN/Creatinine Ratio: 22 (ref 12–28)
BUN: 18 mg/dL (ref 8–27)
Bilirubin Total: 0.2 mg/dL (ref 0.0–1.2)
CO2: 19 mmol/L — ABNORMAL LOW (ref 20–29)
Calcium: 10.1 mg/dL (ref 8.7–10.3)
Chloride: 101 mmol/L (ref 96–106)
Creatinine, Ser: 0.81 mg/dL (ref 0.57–1.00)
Globulin, Total: 2.6 g/dL (ref 1.5–4.5)
Glucose: 87 mg/dL (ref 70–99)
Potassium: 4.5 mmol/L (ref 3.5–5.2)
Sodium: 139 mmol/L (ref 134–144)
Total Protein: 7.5 g/dL (ref 6.0–8.5)
eGFR: 80 mL/min/1.73 (ref >59)

## 2024-06-19 LAB — CBC WITH DIFFERENTIAL/PLATELET
Basophils Absolute: 0.1 x10E3/uL (ref 0.0–0.2)
Basos: 1 %
EOS (ABSOLUTE): 0.2 x10E3/uL (ref 0.0–0.4)
Eos: 2 %
Hematocrit: 44.7 % (ref 34.0–46.6)
Hemoglobin: 14.5 g/dL (ref 11.1–15.9)
Immature Grans (Abs): 0 x10E3/uL (ref 0.0–0.1)
Immature Granulocytes: 0 %
Lymphocytes Absolute: 1.6 x10E3/uL (ref 0.7–3.1)
Lymphs: 26 %
MCH: 32.6 pg (ref 26.6–33.0)
MCHC: 32.4 g/dL (ref 31.5–35.7)
MCV: 100 fL — ABNORMAL HIGH (ref 79–97)
Monocytes Absolute: 0.5 x10E3/uL (ref 0.1–0.9)
Monocytes: 8 %
Neutrophils Absolute: 3.9 x10E3/uL (ref 1.4–7.0)
Neutrophils: 63 %
Platelets: 287 x10E3/uL (ref 150–450)
RBC: 4.45 x10E6/uL (ref 3.77–5.28)
RDW: 11.7 % (ref 11.7–15.4)
WBC: 6.2 x10E3/uL (ref 3.4–10.8)

## 2024-06-19 LAB — IRON,TIBC AND FERRITIN PANEL
Ferritin: 128 ng/mL (ref 15–150)
Iron Saturation: 27 % (ref 15–55)
Iron: 84 ug/dL (ref 27–139)
Total Iron Binding Capacity: 315 ug/dL (ref 250–450)
UIBC: 231 ug/dL (ref 118–369)

## 2024-06-19 LAB — TSH: TSH: 3.86 u[IU]/mL (ref 0.450–4.500)

## 2024-06-19 LAB — VITAMIN D 25 HYDROXY (VIT D DEFICIENCY, FRACTURES): Vit D, 25-Hydroxy: 53.9 ng/mL (ref 30.0–100.0)

## 2024-06-19 LAB — HEMOGLOBIN A1C
Est. average glucose Bld gHb Est-mCnc: 105 mg/dL
Hgb A1c MFr Bld: 5.3 % (ref 4.8–5.6)

## 2024-06-19 LAB — VITAMIN B12

## 2024-07-01 ENCOUNTER — Ambulatory Visit: Payer: Self-pay

## 2024-07-13 NOTE — Progress Notes (Signed)
 "  Acute Office Visit  Subjective:     Patient ID: Brooke Jenkins, female    DOB: [birth year], 68 y.o.   MRN: [id number]  Patient is in today for  Chief Complaint  Patient presents with   Hospitalization Follow-up    Patient is here today for follow up after recent visit to the ED.  She was worked up for dizziness, but she says this has improved.  She is however having hot flashes, asks if we can try veozah .   Due for labs.   No other concerns today.      Review of Systems  Constitutional:  Positive for diaphoresis.  Endo/Heme/Allergies:        Hot flashes  Psychiatric/Behavioral:  The patient is nervous/anxious.   All other systems reviewed and are negative.       Objective:    BP 116/78   Pulse 85   Ht 5' (1.524 m)   Wt 104 lb 9.6 oz (47.4 kg)   SpO2 96%   BMI 20.43 kg/m   Physical Exam Vitals and nursing note reviewed.  Constitutional:      Appearance: Normal appearance. She is normal weight.  HENT:     Head: Normocephalic.  Eyes:     Extraocular Movements: Extraocular movements intact.     Conjunctiva/sclera: Conjunctivae normal.     Pupils: Pupils are equal, round, and reactive to light.  Cardiovascular:     Rate and Rhythm: Normal rate.  Pulmonary:     Effort: Pulmonary effort is normal.  Neurological:     General: No focal deficit present.     Mental Status: She is alert and oriented to person, place, and time. Mental status is at baseline.  Psychiatric:        Mood and Affect: Mood normal.        Behavior: Behavior normal.        Thought Content: Thought content normal.     Results for orders placed or performed in visit on 06/17/24  CBC with Diff  Result Value Ref Range   WBC 6.2 3.4 - 10.8 x10E3/uL   RBC 4.45 3.77 - 5.28 x10E6/uL   Hemoglobin 14.5 11.1 - 15.9 g/dL   Hematocrit 55.2 65.9 - 46.6 %   MCV 100 (H) 79 - 97 fL   MCH 32.6 26.6 - 33.0 pg   MCHC 32.4 31.5 - 35.7 g/dL   RDW 88.2 88.2 - 84.5 %   Platelets 287 150 - 450  x10E3/uL   Neutrophils 63 Not Estab. %   Lymphs 26 Not Estab. %   Monocytes 8 Not Estab. %   Eos 2 Not Estab. %   Basos 1 Not Estab. %   Neutrophils Absolute 3.9 1.4 - 7.0 x10E3/uL   Lymphocytes Absolute 1.6 0.7 - 3.1 x10E3/uL   Monocytes Absolute 0.5 0.1 - 0.9 x10E3/uL   EOS (ABSOLUTE) 0.2 0.0 - 0.4 x10E3/uL   Basophils Absolute 0.1 0.0 - 0.2 x10E3/uL   Immature Granulocytes 0 Not Estab. %   Immature Grans (Abs) 0.0 0.0 - 0.1 x10E3/uL  Lipid panel  Result Value Ref Range   Cholesterol, Total 184 100 - 199 mg/dL   Triglycerides 887 0 - 149 mg/dL   HDL 83 >60 mg/dL   VLDL Cholesterol Cal 19 5 - 40 mg/dL   LDL Chol Calc (NIH) 82 0 - 99 mg/dL   Chol/HDL Ratio 2.2 0.0 - 4.4 ratio  VITAMIN D  25 Hydroxy (Vit-D Deficiency, Fractures)  Result Value Ref Range  Vit D, 25-Hydroxy 53.9 30.0 - 100.0 ng/mL  CMP14+EGFR  Result Value Ref Range   Glucose 87 70 - 99 mg/dL   BUN 18 8 - 27 mg/dL   Creatinine, Ser 9.18 0.57 - 1.00 mg/dL   eGFR 80 >40 fO/fpw/8.26   BUN/Creatinine Ratio 22 12 - 28   Sodium 139 134 - 144 mmol/L   Potassium 4.5 3.5 - 5.2 mmol/L   Chloride 101 96 - 106 mmol/L   CO2 19 (L) 20 - 29 mmol/L   Calcium  10.1 8.7 - 10.3 mg/dL   Total Protein 7.5 6.0 - 8.5 g/dL   Albumin 4.9 3.9 - 4.9 g/dL   Globulin, Total 2.6 1.5 - 4.5 g/dL   Bilirubin Total 0.2 0.0 - 1.2 mg/dL   Alkaline Phosphatase 90 49 - 135 IU/L   AST 35 0 - 40 IU/L   ALT 31 0 - 32 IU/L  TSH  Result Value Ref Range   TSH 3.860 0.450 - 4.500 uIU/mL  Hemoglobin A1c  Result Value Ref Range   Hgb A1c MFr Bld 5.3 4.8 - 5.6 %   Est. average glucose Bld gHb Est-mCnc 105 mg/dL  Vitamin B12  Result Value Ref Range   Vitamin B-12 CANCELED pg/mL  Iron, TIBC and Ferritin Panel  Result Value Ref Range   Total Iron Binding Capacity 315 250 - 450 ug/dL   UIBC 768 881 - 630 ug/dL   Iron 84 27 - 860 ug/dL   Iron Saturation 27 15 - 55 %   Ferritin 128 15 - 150 ng/mL    Recent Results (from the past 2160 hours)   Comprehensive metabolic panel     Status: Abnormal   Collection Time: 05/30/24  6:24 PM  Result Value Ref Range   Sodium 139 135 - 145 mmol/L   Potassium 3.9 3.5 - 5.1 mmol/L   Chloride 101 98 - 111 mmol/L   CO2 27 22 - 32 mmol/L   Glucose, Bld 98 70 - 99 mg/dL    Comment: Glucose reference range applies only to samples taken after fasting for at least 8 hours.   BUN 8 8 - 23 mg/dL   Creatinine, Ser 9.33 0.44 - 1.00 mg/dL   Calcium  9.7 8.9 - 10.3 mg/dL   Total Protein 7.7 6.5 - 8.1 g/dL   Albumin 4.7 3.5 - 5.0 g/dL   AST 899 (H) 15 - 41 U/L   ALT 130 (H) 0 - 44 U/L   Alkaline Phosphatase 92 38 - 126 U/L   Total Bilirubin 0.4 0.0 - 1.2 mg/dL   GFR, Estimated >39 >39 mL/min    Comment: (NOTE) Calculated using the CKD-EPI Creatinine Equation (2021)    Anion gap 12 5 - 15    Comment: Performed at [facility], [address] [place], [place] [address]  CBC     Status: Abnormal   Collection Time: 05/30/24  6:24 PM  Result Value Ref Range   WBC 8.9 4.0 - 10.5 K/uL   RBC 4.27 3.87 - 5.11 MIL/uL   Hemoglobin 13.8 12.0 - 15.0 g/dL   HCT 56.9 63.9 - 53.9 %   MCV 100.7 (H) 80.0 - 100.0 fL   MCH 32.3 26.0 - 34.0 pg   MCHC 32.1 30.0 - 36.0 g/dL   RDW 87.3 88.4 - 84.4 %   Platelets 279 150 - 400 K/uL   nRBC 0.0 0.0 - 0.2 %    Comment: Performed at [facility], [address] [place],  [place] [address]  Urinalysis, Routine w reflex microscopic -Urine, Clean Catch     Status: Abnormal   Collection Time: 05/30/24  6:24 PM  Result Value Ref Range   Color, Urine YELLOW (A) YELLOW   APPearance CLOUDY (A) CLEAR   Specific Gravity, Urine 1.013 1.005 - 1.030   pH 7.0 5.0 - 8.0   Glucose, UA NEGATIVE NEGATIVE mg/dL   Hgb urine dipstick NEGATIVE NEGATIVE   Bilirubin Urine NEGATIVE NEGATIVE   Ketones, ur NEGATIVE NEGATIVE mg/dL   Protein, ur NEGATIVE NEGATIVE mg/dL   Nitrite NEGATIVE NEGATIVE   Leukocytes,Ua NEGATIVE NEGATIVE    Comment: Performed at [facility], [address] [place], [place] [address]  CBG monitoring, ED     Status: None   Collection Time: 05/30/24  7:24 PM  Result Value Ref Range   Glucose-Capillary 95 70 - 99 mg/dL    Comment: Glucose reference range applies only to samples taken after fasting for at least 8 hours.  CBC with Diff     Status: Abnormal   Collection Time: 06/17/24  2:59 PM  Result Value Ref Range   WBC 6.2 3.4 - 10.8 x10E3/uL   RBC 4.45 3.77 - 5.28 x10E6/uL   Hemoglobin 14.5 11.1 - 15.9 g/dL   Hematocrit 55.2 65.9 - 46.6 %   MCV 100 (H) 79 - 97 fL   MCH 32.6 26.6 - 33.0 pg   MCHC 32.4 31.5 - 35.7 g/dL   RDW 88.2 88.2 - 84.5 %   Platelets 287 150 - 450 x10E3/uL   Neutrophils 63 Not Estab. %   Lymphs 26 Not Estab. %   Monocytes 8 Not Estab. %   Eos 2 Not Estab. %   Basos 1 Not Estab. %   Neutrophils Absolute 3.9 1.4 - 7.0 x10E3/uL   Lymphocytes Absolute 1.6 0.7 - 3.1 x10E3/uL   Monocytes Absolute 0.5 0.1 - 0.9 x10E3/uL   EOS (ABSOLUTE) 0.2 0.0 - 0.4 x10E3/uL   Basophils Absolute 0.1 0.0 - 0.2 x10E3/uL   Immature Granulocytes 0 Not Estab. %   Immature Grans (Abs) 0.0 0.0 - 0.1 x10E3/uL  Lipid panel     Status: None   Collection Time: 06/17/24  2:59 PM  Result Value Ref Range   Cholesterol, Total 184 100 - 199 mg/dL   Triglycerides 887 0 - 149 mg/dL   HDL 83 >60 mg/dL   VLDL Cholesterol Cal 19 5 - 40 mg/dL   LDL Chol Calc (NIH) 82 0 - 99 mg/dL   Chol/HDL Ratio 2.2 0.0 - 4.4 ratio    Comment:                                   T. Chol/HDL Ratio                                             Men  Women                               1/2 Avg.Risk  3.4    3.3                                   Avg.Risk  5.0    4.4                                2X Avg.Risk  9.6    7.1                                3X Avg.Risk 23.4   11.0   VITAMIN D  25 Hydroxy (Vit-D Deficiency, Fractures)     Status: None   Collection Time: 06/17/24  2:59 PM  Result Value Ref Range   Vit D, 25-Hydroxy 53.9 30.0 - 100.0  ng/mL    Comment: Vitamin D  deficiency has been defined by the Institute of Medicine and an Endocrine Society practice guideline as a level of serum 25-OH vitamin D  less than 20 ng/mL (1,2). The Endocrine Society went on to further define vitamin D  insufficiency as a level between 21 and 29 ng/mL (2). 1. IOM (Institute of Medicine). 2010. Dietary reference    intakes for calcium  and D. [place]: The    [employer]. 2. Holick MF, Binkley Montgomery, Bischoff-Ferrari HA, et al.    Evaluation, treatment, and prevention of vitamin D     deficiency: an Endocrine Society clinical practice    guideline. JCEM. 2011 Jul; 96(7):1911-30.   CMP14+EGFR     Status: Abnormal   Collection Time: 06/17/24  2:59 PM  Result Value Ref Range   Glucose 87 70 - 99 mg/dL   BUN 18 8 - 27 mg/dL   Creatinine, Ser 9.18 0.57 - 1.00 mg/dL   eGFR 80 >40 fO/fpw/8.26   BUN/Creatinine Ratio 22 12 - 28   Sodium 139 134 - 144 mmol/L   Potassium 4.5 3.5 - 5.2 mmol/L   Chloride 101 96 - 106 mmol/L   CO2 19 (L) 20 - 29 mmol/L   Calcium  10.1 8.7 - 10.3 mg/dL   Total Protein 7.5 6.0 - 8.5 g/dL   Albumin 4.9 3.9 - 4.9 g/dL   Globulin, Total 2.6 1.5 - 4.5 g/dL   Bilirubin Total 0.2 0.0 - 1.2 mg/dL   Alkaline Phosphatase 90 49 - 135 IU/L   AST 35 0 - 40 IU/L   ALT 31 0 - 32 IU/L  TSH     Status: None   Collection Time: 06/17/24  2:59 PM  Result Value Ref Range   TSH 3.860 0.450 - 4.500 uIU/mL  Hemoglobin A1c     Status: None   Collection Time: 06/17/24  2:59 PM  Result Value Ref Range   Hgb A1c MFr Bld 5.3 4.8 - 5.6 %    Comment:          Prediabetes: 5.7 - 6.4          Diabetes: >6.4          Glycemic control for adults with diabetes: <7.0    Est. average glucose Bld gHb Est-mCnc 105 mg/dL  Vitamin B12     Status: None   Collection Time: 06/17/24  2:59 PM  Result Value Ref Range   Vitamin B-12 CANCELED pg/mL    Comment: [facility] was unable to collect sufficient specimen to perform the following  test(s), and is providing the patient with re-collection instructions.  Result canceled by the ancillary.   Iron, TIBC and Ferritin Panel     Status: None   Collection Time: 06/17/24  2:59 PM  Result Value Ref Range  Total Iron Binding Capacity 315 250 - 450 ug/dL   UIBC 768 881 - 630 ug/dL   Iron 84 27 - 860 ug/dL   Iron Saturation 27 15 - 55 %   Ferritin 128 15 - 150 ng/mL    Allergies as of 06/17/2024       Reactions   Amoxicillin-pot Clavulanate Nausea And Vomiting   Bee Venom Anaphylaxis   As a child   Mirabegron  Cough, Other (See Comments), Shortness Of Breath   SOB, sore throat, cough, tremor, upset stomachs   Aspirin  Other (See Comments)   Abdominal Pain   Ciprofloxacin Nausea And Vomiting   Tramadol Nausea And Vomiting   Vicodin [hydrocodone-acetaminophen ] Itching        Medication List        Accurate as of June 17, 2024 11:59 PM. If you have any questions, ask your nurse or doctor.          STOP taking these medications    cephALEXin  500 MG capsule Commonly known as: KEFLEX  Stopped by: Ibrohim Simmers M Trenda Corliss   Lurasidone  HCl 60 MG Tabs Stopped by: ALAN CHRISTELLA ARRANT       TAKE these medications    acetaminophen  500 MG tablet Commonly known as: TYLENOL  Take 1,000 mg by mouth in the morning.   Austedo 6 MG Tabs Generic drug: Deutetrabenazine Take 1 tablet by mouth 2 (two) times daily.   Auvelity 45-105 MG Tbcr Generic drug: Dextromethorphan-buPROPion ER Take 1 tablet by mouth in the morning and at bedtime.   butalbital -acetaminophen -caffeine  50-325-40 MG tablet Commonly known as: FIORICET  Take 1 tablet by mouth every 6 (six) hours as needed for migraine.   cetirizine 10 MG tablet Commonly known as: ZYRTEC Take 10 mg by mouth daily.   clonazePAM  1 MG tablet Commonly known as: KLONOPIN  Take 1-2 mg by mouth at bedtime.   cloNIDine 0.2 MG tablet Commonly known as: CATAPRES Take 0.2 mg by mouth daily as needed (hot flashes).    cyclobenzaprine  5 MG tablet Commonly known as: FLEXERIL  TAKE 1 TABLET(5 MG) BY MOUTH THREE TIMES DAILY AS NEEDED   DayVigo  10 MG Tabs Generic drug: Lemborexant  Take 10 mg by mouth at bedtime.   doxycycline 100 MG capsule Commonly known as: VIBRAMYCIN Take 100 mg by mouth 2 (two) times daily.   estradiol  0.1 MG/GM vaginal cream Commonly known as: ESTRACE  Estrogen Cream Instruction Discard applicator Apply pea sized amount to tip of finger to urethra before bed. Wash hands well after application. Use Monday, Wednesday and Friday   gabapentin  100 MG tablet Commonly known as: NEURONTIN  Take 200 mg by mouth daily.   Linzess  290 MCG Caps capsule Generic drug: linaclotide  Take 290 mcg by mouth daily.   meloxicam 7.5 MG tablet Commonly known as: MOBIC Take 7.5 mg by mouth daily.   methylphenidate 10 MG tablet Commonly known as: RITALIN Take 10 mg by mouth in the morning.   ramelteon 8 MG tablet Commonly known as: ROZEREM Take 8 mg by mouth at bedtime.   rosuvastatin  20 MG tablet Commonly known as: CRESTOR  Take 1 tablet (20 mg total) by mouth daily.   ticagrelor  90 MG Tabs tablet Commonly known as: BRILINTA  Take 1 tablet (90 mg total) by mouth 2 (two) times daily.   TRINTELLIX PO Take by mouth.   Veozah  45 MG Tabs Generic drug: Fezolinetant  Take 1 tablet (45 mg total) by mouth daily. Started by: ALAN CHRISTELLA ARRANT            Assessment &  Plan B12 deficiency due to diet Vitamin D  deficiency, unspecified Other fatigue Checking labs today.  Will continue supplements as needed.   - Vitamin D  - Vitamin B12 - TSH  Prediabetes A1C Continues to be in prediabetic ranges.  Will reassess at follow up after next lab check.  Patient counseled on dietary choices and verbalized understanding.   -CBC w/Diff -CMP w/eGFR -Hemoglobin A1C  Mixed hyperlipidemia Checking labs today.  Continue current therapy for lipid control. Will modify as needed based on labwork  results.   -CMP w/eGFR -Lipid Panel  Essential hypertension, benign Blood pressure well controlled with current medications.  Continue current therapy.  Will reassess at follow up.   - CBC w/Diff - CMP w/eGFR  Dizziness Issue has resolved at the moment.  If not continuing to improve, she will let me know.  Reassess as needed.   Menopausal hot flushes Sending RX for Veozah .  Will let me know if this is not effective.     Return in about 3 months (around 09/15/2024) for F/U.  Total time spent: 30 minutes  ALAN CHRISTELLA ARRANT, FNP  06/17/2024   This document may have been prepared by [facility] Voice Recognition software and as such may include unintentional dictation errors.  "

## 2024-08-12 ENCOUNTER — Other Ambulatory Visit: Payer: Self-pay

## 2024-08-12 ENCOUNTER — Ambulatory Visit (INDEPENDENT_AMBULATORY_CARE_PROVIDER_SITE_OTHER): Admitting: Physician Assistant

## 2024-08-12 VITALS — BP 158/93 | HR 112 | Ht 60.0 in | Wt 102.6 lb

## 2024-08-12 DIAGNOSIS — N3001 Acute cystitis with hematuria: Secondary | ICD-10-CM

## 2024-08-12 DIAGNOSIS — N958 Other specified menopausal and perimenopausal disorders: Secondary | ICD-10-CM

## 2024-08-12 LAB — URINALYSIS, COMPLETE
Glucose, UA: NEGATIVE
Ketones, UA: NEGATIVE
Nitrite, UA: POSITIVE — AB
Specific Gravity, UA: 1.03 (ref 1.005–1.030)
Urobilinogen, Ur: 0.2 mg/dL (ref 0.2–1.0)
pH, UA: 5.5 (ref 5.0–7.5)

## 2024-08-12 LAB — MICROSCOPIC EXAMINATION
RBC, Urine: >30 /HPF — AB (ref 0–2)
WBC, UA: >30 /HPF — AB (ref 0–5)

## 2024-08-12 MED ORDER — SULFAMETHOXAZOLE-TRIMETHOPRIM 800-160 MG PO TABS: 1.0000 | ORAL_TABLET | Freq: Two times a day (BID) | ORAL | 0 refills | Status: AC

## 2024-08-12 MED ORDER — ESTRADIOL 0.01 % VA CREA: 0.5000 g | TOPICAL_CREAM | VAGINAL | 12 refills | Status: AC

## 2024-08-12 NOTE — Progress Notes (Signed)
 "  08/12/2024 3:24 PM   Brooke Jenkins 07/01/1956 [id number]  CC: Chief Complaint  Patient presents with   Hematuria   HPI: Brooke Jenkins is a 69 y.o. 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   Today she reports 2 weeks of gross hematuria, which has been progressively improving.  Notably, Dr. Florencio switched her from Brilinta  to Plavix about 3 days ago.  She developed sudden onset bladder burning and sharp pains while having sex with a new partner.  She was not using lubricant and found penetration difficult.  She has been using topical vaginal estrogen cream at the meatus 3 times weekly as previously prescribed by Dr. Penne.  In-office UA today positive for 2+ protein, 3+ blood, 1+ bilirubin, nitrates, and 2+ leukocytes; urine microscopy with >30 WBCs/HPF, >30 RBCs/HPF, and many bacteria.  PMH: Past Medical History:  Diagnosis Date   Acute upper respiratory infection 03/03/2012   Anxiety    Arthritis    Cataracts, bilateral    Change in bowel habits 01/02/2023   Chronic hepatitis C (HCC) 04/25/2010   Depression    History of hepatitis    History of nephrolithiasis 03/28/2016   Insomnia    Kidney stones    Tinnitus     Surgical History: Past Surgical History:  Procedure Laterality Date   CHOLECYSTECTOMY     COLONOSCOPY WITH PROPOFOL  N/A 05/19/2019   Procedure: COLONOSCOPY WITH PROPOFOL ;  Surgeon: Unk Corinn Skiff, MD;  Location: [facility];  Service: Gastroenterology;  Laterality: N/A;   CORONARY STENT INTERVENTION N/A 08/16/2023   Procedure: CORONARY STENT INTERVENTION;  Surgeon: Florencio Cara BIRCH, MD;  Location: [facility];  Service: Cardiovascular;  Laterality: N/A;   ESOPHAGOGASTRODUODENOSCOPY (EGD) WITH PROPOFOL  N/A 05/19/2019   Procedure:  ESOPHAGOGASTRODUODENOSCOPY (EGD) WITH PROPOFOL ;  Surgeon: Unk Corinn Skiff, MD;  Location: [facility];  Service: Gastroenterology;  Laterality: N/A;   EXTRACORPOREAL SHOCK WAVE LITHOTRIPSY Left 11/03/2021   Procedure: EXTRACORPOREAL SHOCK WAVE LITHOTRIPSY (ESWL);  Surgeon: Twylla Glendia BROCKS, MD;  Location: [facility];  Service: Urology;  Laterality: Left;   EXTRACORPOREAL SHOCK WAVE LITHOTRIPSY Left 10/27/2021   Procedure: EXTRACORPOREAL SHOCK WAVE LITHOTRIPSY (ESWL);  Surgeon: Penne Knee, MD;  Location: [facility];  Service: Urology;  Laterality: Left;   HEMORROIDECTOMY     KIDNEY STONE SURGERY     LEFT HEART CATH AND CORONARY ANGIOGRAPHY Left 08/16/2023   Procedure: LEFT HEART CATH AND CORONARY ANGIOGRAPHY;  Surgeon: Fernand Denyse DELENA, MD;  Location: [facility];  Service: Cardiovascular;  Laterality: Left;   LOWER EXTREMITY ANGIOGRAPHY Right 08/16/2023   Procedure: Lower Extremity Angiography;  Surgeon: Marea Selinda RAMAN, MD;  Location: [facility];  Service: Cardiovascular;  Laterality: Right;   TEMPOROMANDIBULAR JOINT SURGERY      Home Medications:  Allergies as of 08/12/2024       Reactions   Amoxicillin-pot Clavulanate Nausea And Vomiting   Bee Venom Anaphylaxis   As a child   Mirabegron  Cough, Other (See Comments), Shortness Of Breath   SOB, sore throat, cough, tremor, upset stomachs   Aspirin  Other (See Comments)   Abdominal Pain   Ciprofloxacin Nausea And Vomiting   Tramadol Nausea And Vomiting   Vicodin [hydrocodone-acetaminophen ] Itching        Medication List  Accurate as of August 12, 2024  3:24 PM. If you have any questions, ask your nurse or doctor.          STOP taking these medications    meloxicam 7.5 MG tablet Commonly known as: MOBIC       TAKE these medications    acetaminophen  500 MG tablet Commonly known as: TYLENOL  Take 1,000 mg by mouth in the morning.   Austedo 9 MG Tabs Generic drug: Deutetrabenazine Take 1  tablet by mouth 2 (two) times daily. What changed: Another medication with the same name was removed. Continue taking this medication, and follow the directions you see here.   Auvelity 45-105 MG Tbcr Generic drug: Dextromethorphan-buPROPion ER Take 1 tablet by mouth in the morning and at bedtime.   butalbital -acetaminophen -caffeine  50-325-40 MG tablet Commonly known as: FIORICET  Take 1 tablet by mouth every 6 (six) hours as needed for migraine.   cetirizine 10 MG tablet Commonly known as: ZYRTEC Take 10 mg by mouth daily.   clonazePAM  1 MG tablet Commonly known as: KLONOPIN  Take 1-2 mg by mouth at bedtime.   cloNIDine 0.2 MG tablet Commonly known as: CATAPRES Take 0.2 mg by mouth daily as needed (hot flashes).   cyclobenzaprine  5 MG tablet Commonly known as: FLEXERIL  TAKE 1 TABLET(5 MG) BY MOUTH THREE TIMES DAILY AS NEEDED   DayVigo  10 MG Tabs Generic drug: Lemborexant  Take 10 mg by mouth at bedtime.   doxycycline 100 MG capsule Commonly known as: VIBRAMYCIN Take 100 mg by mouth 2 (two) times daily.   estradiol  0.1 MG/GM vaginal cream Commonly known as: ESTRACE  Estrogen Cream Instruction Discard applicator Apply pea sized amount to tip of finger to urethra before bed. Wash hands well after application. Use Monday, Wednesday and Friday   gabapentin  100 MG capsule Commonly known as: NEURONTIN  Take by mouth. What changed: Another medication with the same name was removed. Continue taking this medication, and follow the directions you see here.   Linzess  290 MCG Caps capsule Generic drug: linaclotide  Take 290 mcg by mouth daily.   meclizine 25 MG tablet Commonly known as: ANTIVERT Take 25 mg by mouth 3 (three) times daily as needed.   methylphenidate 20 MG tablet Commonly known as: RITALIN Take 20 mg by mouth 2 (two) times daily. What changed: Another medication with the same name was removed. Continue taking this medication, and follow the directions you see  here.   ramelteon 8 MG tablet Commonly known as: ROZEREM Take 8 mg by mouth at bedtime.   rosuvastatin  5 MG tablet Commonly known as: CRESTOR  Take 5 mg by mouth daily. What changed: Another medication with the same name was removed. Continue taking this medication, and follow the directions you see here.   ticagrelor  90 MG Tabs tablet Commonly known as: BRILINTA  Take 1 tablet (90 mg total) by mouth 2 (two) times daily.   Trintellix 10 MG Tabs tablet Generic drug: vortioxetine HBr Take 10 mg by mouth daily. What changed: Another medication with the same name was removed. Continue taking this medication, and follow the directions you see here.   Veozah  45 MG Tabs Generic drug: Fezolinetant  Take 1 tablet (45 mg total) by mouth daily.        Allergies:  Allergies[1]  Family History: Family History  Problem Relation Age of Onset   Dementia Mother    Alzheimer's disease Mother    Parkinson's disease Mother    Migraines Mother    Other Mother  Back issues   Other Father        Back Issues   Dementia Father    Arthritis Maternal Grandmother    Migraines Maternal Grandfather     Social History:   reports that she has quit smoking. Her smoking use included cigarettes. She has a 10 pack-year smoking history. She has never used smokeless tobacco. She reports that she does not currently use drugs. She reports that she does not drink alcohol.  Physical Exam: BP (!) 158/93   Pulse (!) 112   Ht 5' (1.524 m)   Wt 102 lb 9.6 oz (46.5 kg)   SpO2 97%   BMI 20.04 kg/m   Constitutional:  Alert and oriented, no acute distress, nontoxic appearing HEENT: [place], AT Cardiovascular: No clubbing, cyanosis, or edema Respiratory: Normal respiratory effort, no increased work of breathing Skin: No rashes, bruises or suspicious lesions Neurologic: Grossly intact, no focal deficits, moving all 4 extremities Psychiatric: Normal mood and affect  Laboratory Data: Results for orders  placed or performed in visit on 08/12/24  Microscopic Examination   Collection Time: 08/12/24  3:05 PM   Urine  Result Value Ref Range   WBC, UA >30 (A) 0 - 5 /hpf   RBC, Urine >30 (A) 0 - 2 /hpf   Epithelial Cells (non renal) 0-10 0 - 10 /hpf   Bacteria, UA Many (A) None seen/Few  Urinalysis, Complete   Collection Time: 08/12/24  3:05 PM  Result Value Ref Range   Specific Gravity, UA 1.030 1.005 - 1.030   pH, UA 5.5 5.0 - 7.5   Color, UA Brown (A) Yellow   Appearance Ur Cloudy (A) Clear   Leukocytes,UA 2+ (A) Negative   Protein,UA 2+ (A) Negative/Trace   Glucose, UA Negative Negative   Ketones, UA Negative Negative   RBC, UA 3+ (A) Negative   Bilirubin, UA Comment (A) Negative   Urobilinogen, Ur 0.2 0.2 - 1.0 mg/dL   Nitrite, UA Positive (A) Negative   Microscopic Examination See below:    Assessment & Plan:   1. Acute cystitis with hematuria (Primary) UA appears grossly positive, will start empiric Bactrim  and send for culture.  Will plan to get a urine cytology in 2 weeks after completion of culture appropriate antibiotics.  I do not think we need to repeat hematuria workup so soon unless there are abnormalities on cytology.  Will continue to monitor. - Urinalysis, Complete - CULTURE, URINE COMPREHENSIVE - sulfamethoxazole -trimethoprim  (BACTRIM  DS) 800-160 MG tablet; Take 1 tablet by mouth 2 (two) times daily for 5 days.  Dispense: 10 tablet; Refill: 0  2. Genitourinary syndrome of menopause Significant vaginal dryness that interfered with recent sex.  We discussed using personal lubricant to ease penetration, and I am augmenting her vaginal estrogen cream to GYN dosing intravaginally. - estradiol  (ESTRACE ) 0.01 % CREA vaginal cream; Place 0.5 g vaginally 2 (two) times a week.  Dispense: 42.5 g; Refill: 12   Return in about 2 weeks (around 08/26/2024) for Lab visit for urine cytology.  Lucie Hones, PA-C  [facility] [address]  [address] [place], [place] [address] [phone]      [1]  Allergies Allergen Reactions   Amoxicillin-Pot Clavulanate Nausea And Vomiting   Bee Venom Anaphylaxis    As a child   Mirabegron  Cough, Other (See Comments) and Shortness Of Breath    SOB, sore throat, cough, tremor, upset stomachs   Aspirin  Other (See Comments)    Abdominal Pain  Ciprofloxacin Nausea And Vomiting   Tramadol Nausea And Vomiting   Vicodin [Hydrocodone-Acetaminophen ] Itching   "

## 2024-08-12 NOTE — Patient Instructions (Signed)
 Brooke Jenkins

## 2024-08-13 ENCOUNTER — Other Ambulatory Visit: Payer: Self-pay

## 2024-08-13 ENCOUNTER — Emergency Department

## 2024-08-13 ENCOUNTER — Emergency Department
Admission: EM | Admit: 2024-08-13 | Discharge: 2024-08-13 | Disposition: A | Discharge status: Home / Self Care | Attending: Emergency Medicine | Admitting: Emergency Medicine

## 2024-08-13 ENCOUNTER — Ambulatory Visit: Admitting: Physician Assistant

## 2024-08-13 DIAGNOSIS — M79602 Pain in left arm: Secondary | ICD-10-CM | POA: Insufficient documentation

## 2024-08-13 DIAGNOSIS — W1839XA Other fall on same level, initial encounter: Secondary | ICD-10-CM | POA: Diagnosis not present

## 2024-08-13 NOTE — Assessment & Plan Note (Signed)
 Checking labs today.  Continue current therapy for lipid control. Will modify as needed based on labwork results.   -CMP w/eGFR -Lipid Panel

## 2024-08-13 NOTE — Assessment & Plan Note (Signed)
 Sending RX for Veozah .  Will let me know if this is not effective.

## 2024-08-13 NOTE — Discharge Instructions (Addendum)
 Your exam and x-ray are normal and reassuring.  No signs of any acute fracture or dislocation to the left arm.  My concern is that your reports of drowsiness and falling, may be due to multiple sedating medications.  You should not discontinue the cyclobenzaprine  at this time.  Follow-up with your primary provider for ongoing medication management.  You should also see orthopedics for evaluation of your left shoulder pain and weakness which may be due to to underlying tendinitis.

## 2024-08-13 NOTE — ED Triage Notes (Signed)
 Pt to ED for for left upper arm pain for multiple weeks from fall several weeks ago. Reports pain worsens with movement.

## 2024-08-13 NOTE — ED Provider Notes (Signed)
 "   [facility] Emergency Department Provider Note     Event Date/Time   First MD Initiated Contact with Patient 08/13/24 1921     (approximate)   History   Arm Pain   HPI  Brooke Jenkins is a 69 y.o. female with a history of depression, anxiety, vision deficit disorder, DDD, chronic hepatitis, insomnia, and vertigo, presents to the ED endorsing left extremity pain for several weeks.  Patient reports fall several weeks ago, and has had multiple falls in the interim.  By report, she has difficulty falling asleep, so she takes her medications, typically finds her still waking up at 3 AM on the couch, and when she goes to the bathroom, she apparently feels dizzy and has had some falls but she denies any head injury, LOC, or paresthesias.  She presents to the ED endorsing left upper extremity arm pain with disability as well as weakness.  No recent injury or trauma reported.  Chart review, reveals the patient is currently on clonazepam , Dayvigo  for insomnia, Auvelity, gabapentin , as well as Remeron and meclizine.  Physical Exam   Triage Vital Signs: ED Triage Vitals [08/13/24 1644]  Encounter Vitals Group     BP 128/79     Girls Systolic BP Percentile      Girls Diastolic BP Percentile      Boys Systolic BP Percentile      Boys Diastolic BP Percentile      Pulse Rate (!) 101     Resp 16     Temp 97.9 F (36.6 C)     Temp src      SpO2 94 %     Weight 101 lb 6.6 oz (46 kg)     Height 5' (1.524 m)     Head Circumference      Peak Flow      Pain Score 10     Pain Loc      Pain Education      Exclude from Growth Chart     Most recent vital signs: Vitals:   08/13/24 1644  BP: 128/79  Pulse: (!) 101  Resp: 16  Temp: 97.9 F (36.6 C)  SpO2: 94%    General Awake, no distress. NAD HEENT NCAT. PERRL. EOMI. No rhinorrhea. Mucous membranes are moist.  CV:  Good peripheral perfusion. RRR RESP:  Normal effort. CTA ABD:  No distention.  MSK:  AROM of  all extremities.  No gross deformity to the LUE.  Normal spinal alignment without midline tenderness, spasm, deformity, or step-off.  Normal composite fist distally.  No evidence of rotator cuff deficit or impingement sign.  Patient with some self-limited LUE range of motion secondary to subjective complaints of pain. NEURO: Cranial nerves II to XII grossly intact.  Normal UE DTRs bilaterally.   ED Results / Procedures / Treatments   Labs (all labs ordered are listed, but only abnormal results are displayed) Labs Reviewed - No data to display   EKG   RADIOLOGY  I personally viewed and evaluated these images as part of my medical decision making, as well as reviewing the written report by the radiologist.  ED Provider Interpretation: No acute fracture or dislocation  DG Humerus Left Result Date: 08/13/2024 EXAM: _VIEWS_ VIEW(S) XRAY OF THE LEFT HUMERUS 08/13/2024 05:17:00 PM COMPARISON: None available. CLINICAL HISTORY: Injury. FINDINGS: BONES AND JOINTS: No acute fracture. No malalignment. SOFT TISSUES: Unremarkable. IMPRESSION: 1. No acute fracture or dislocation. Electronically signed by: Elsie Gravely MD 08/13/2024  05:38 PM EST RP Workstation: [id number]   DG Shoulder Left Result Date: 08/13/2024 EXAM: 1 VIEW(S) XRAY OF THE LEFT SHOULDER 08/13/2024 05:17:00 PM COMPARISON: None available. CLINICAL HISTORY: Injury. FINDINGS: BONES AND JOINTS: Glenohumeral joint is normally aligned. No acute fracture. No malalignment. The [facility] joint is unremarkable. SOFT TISSUES: No abnormal calcifications. Visualized lung is unremarkable. IMPRESSION: 1. No evidence of acute traumatic injury. Electronically signed by: Elsie Gravely MD 08/13/2024 05:37 PM EST RP Workstation: [id number]     PROCEDURES:  Critical Care performed: No  Procedures   MEDICATIONS ORDERED IN ED: Medications - No data to display   IMPRESSION / MDM / ASSESSMENT AND PLAN / ED COURSE  I reviewed the triage vital signs and  the nursing notes.                              Differential diagnosis includes, but is not limited to, ring, strain, fracture or dislocation  Patient's presentation is most consistent with acute complicated illness / injury requiring diagnostic workup.  Patient's diagnosis is consistent with musculoskeletal LUE pain without evidence of acute fracture or dislocation.  Patient's exam is overall reassuring without evidence of acute arthropathy.  No significant rotator cuff weakness noted.  Pain is aggravated by extreme internal/external rotation.  Pain is also supplemented by patient's complaints.  No x-ray evidence of any acute fracture or dislocation based on my interpretation.  Patient was symptoms due to mechanical falls in the recent past.  No gross deformity to the left upper extremity on exam.  Patient subjectively reporting dizziness and falls secondary to drowsiness.  I question whether the patient's symptoms may be due to her multiple medications with sedating effects.  I asked the patient to follow-up with her primary provider related to polypharmacy.  She is also being referred to orthopedics for evaluation of her left shoulder pain and disability.  This may represent tendinitis or underlying arthropathy.  Patient will be discharged home with instructions to discontinue her cyclobenzaprine  in the short-term. Patient is to follow up with her PCP and orthopedics as suggested, as needed or otherwise directed. Patient is given ED precautions to return to the ED for any worsening or new symptoms.  FINAL CLINICAL IMPRESSION(S) / ED DIAGNOSES   Final diagnoses:  Left arm pain  Musculoskeletal arm pain, left     Rx / DC Orders   ED Discharge Orders     None        Note:  This document was prepared using Dragon voice recognition software and may include unintentional dictation errors.    Loyd Candida LULLA Aldona, PA-C 08/13/24 2121    Waymond Lorelle Cummins, MD 08/16/24 1515  "

## 2024-08-15 LAB — CULTURE, URINE COMPREHENSIVE

## 2024-08-18 ENCOUNTER — Other Ambulatory Visit: Payer: Self-pay | Admitting: Family

## 2024-08-20 NOTE — Progress Notes (Unsigned)
 "    08/21/2024 5:11 PM   Brooke Jenkins 1956/01/28 [id number]  Referring provider: Orlean Alan HERO, FNP [address] [place],  [place] [address]  Urological history: 1.  Nephrolithiasis - PCNL (2007) - bilateral URS (2018)   2. High risk hematuria - former smoker - CTU (2025) small bilateral non obstructing stones - cysto (2025) normal   3. GSM - Vaginal estrogen cream 3 days weekly  No chief complaint on file.  HPI: Brooke Jenkins is a 69 y.o. woman who presents today for passing large amounts of blood.   Previous records reviewed.  UA ***    PMH: Past Medical History:  Diagnosis Date   Acute upper respiratory infection 03/03/2012   Anxiety    Arthritis    Cataracts, bilateral    Change in bowel habits 01/02/2023   Chronic hepatitis C (HCC) 04/25/2010   Depression    History of hepatitis    History of nephrolithiasis 03/28/2016   Insomnia    Kidney stones    Tinnitus     Surgical History: Past Surgical History:  Procedure Laterality Date   CHOLECYSTECTOMY     COLONOSCOPY WITH PROPOFOL  N/A 05/19/2019   Procedure: COLONOSCOPY WITH PROPOFOL ;  Surgeon: Unk Corinn Skiff, MD;  Location: [facility];  Service: Gastroenterology;  Laterality: N/A;   CORONARY STENT INTERVENTION N/A 08/16/2023   Procedure: CORONARY STENT INTERVENTION;  Surgeon: Florencio Cara BIRCH, MD;  Location: [facility];  Service: Cardiovascular;  Laterality: N/A;   ESOPHAGOGASTRODUODENOSCOPY (EGD) WITH PROPOFOL  N/A 05/19/2019   Procedure: ESOPHAGOGASTRODUODENOSCOPY (EGD) WITH PROPOFOL ;  Surgeon: Unk Corinn Skiff, MD;  Location: [facility];  Service: Gastroenterology;  Laterality: N/A;   EXTRACORPOREAL SHOCK WAVE LITHOTRIPSY Left 11/03/2021   Procedure: EXTRACORPOREAL SHOCK WAVE LITHOTRIPSY (ESWL);  Surgeon: Twylla Glendia BROCKS, MD;  Location: [facility];  Service: Urology;  Laterality: Left;   EXTRACORPOREAL SHOCK WAVE LITHOTRIPSY Left 10/27/2021   Procedure:  EXTRACORPOREAL SHOCK WAVE LITHOTRIPSY (ESWL);  Surgeon: Penne Knee, MD;  Location: [facility];  Service: Urology;  Laterality: Left;   HEMORROIDECTOMY     KIDNEY STONE SURGERY     LEFT HEART CATH AND CORONARY ANGIOGRAPHY Left 08/16/2023   Procedure: LEFT HEART CATH AND CORONARY ANGIOGRAPHY;  Surgeon: Fernand Denyse DELENA, MD;  Location: [facility];  Service: Cardiovascular;  Laterality: Left;   LOWER EXTREMITY ANGIOGRAPHY Right 08/16/2023   Procedure: Lower Extremity Angiography;  Surgeon: Marea Selinda RAMAN, MD;  Location: [facility];  Service: Cardiovascular;  Laterality: Right;   TEMPOROMANDIBULAR JOINT SURGERY      Home Medications:  Allergies as of 08/21/2024       Reactions   Amoxicillin-pot Clavulanate Nausea And Vomiting   Bee Venom Anaphylaxis   As a child   Mirabegron  Cough, Other (See Comments), Shortness Of Breath   SOB, sore throat, cough, tremor, upset stomachs   Aspirin  Other (See Comments)   Abdominal Pain   Ciprofloxacin Nausea And Vomiting   Tramadol Nausea And Vomiting   Vicodin [hydrocodone-acetaminophen ] Itching        Medication List        Accurate as of August 20, 2024  5:11 PM. If you have any questions, ask your nurse or doctor.          acetaminophen  500 MG tablet Commonly known as: TYLENOL  Take 1,000 mg by mouth in the morning.   Austedo 9 MG Tabs Generic drug: Deutetrabenazine Take 1 tablet by mouth 2 (two) times daily.   Auvelity 45-105  MG Tbcr Generic drug: Dextromethorphan-buPROPion ER Take 1 tablet by mouth in the morning and at bedtime.   butalbital -acetaminophen -caffeine  50-325-40 MG tablet Commonly known as: FIORICET  Take 1 tablet by mouth every 6 (six) hours as needed for migraine.   cetirizine 10 MG tablet Commonly known as: ZYRTEC Take 10 mg by mouth daily.   clonazePAM  1 MG tablet Commonly known as: KLONOPIN  Take 1-2 mg by mouth at bedtime.   cloNIDine 0.2 MG tablet Commonly known as: CATAPRES Take 0.2  mg by mouth daily as needed (hot flashes).   cyclobenzaprine  5 MG tablet Commonly known as: FLEXERIL  TAKE 1 TABLET(5 MG) BY MOUTH THREE TIMES DAILY AS NEEDED   DayVigo  10 MG Tabs Generic drug: Lemborexant  Take 10 mg by mouth at bedtime.   estradiol  0.01 % Crea vaginal cream Commonly known as: ESTRACE  Place 0.5 g vaginally 2 (two) times a week.   gabapentin  100 MG capsule Commonly known as: NEURONTIN  Take by mouth.   Linzess  290 MCG Caps capsule Generic drug: linaclotide  Take 290 mcg by mouth daily.   meclizine 25 MG tablet Commonly known as: ANTIVERT Take 25 mg by mouth 3 (three) times daily as needed.   methylphenidate 20 MG tablet Commonly known as: RITALIN Take 20 mg by mouth 2 (two) times daily.   ramelteon 8 MG tablet Commonly known as: ROZEREM Take 8 mg by mouth at bedtime.   rosuvastatin  5 MG tablet Commonly known as: CRESTOR  Take 5 mg by mouth daily.   Trintellix 10 MG Tabs tablet Generic drug: vortioxetine HBr Take 10 mg by mouth daily.   Veozah  45 MG Tabs Generic drug: Fezolinetant  Take 1 tablet (45 mg total) by mouth daily.        Allergies: Allergies[1]  Family History: Family History  Problem Relation Age of Onset   Dementia Mother    Alzheimer's disease Mother    Parkinson's disease Mother    Migraines Mother    Other Mother        Back issues   Other Father        Back Issues   Dementia Father    Arthritis Maternal Grandmother    Migraines Maternal Grandfather     Social History:  reports that she has quit smoking. Her smoking use included cigarettes. She has a 10 pack-year smoking history. She has never used smokeless tobacco. She reports that she does not currently use drugs. She reports that she does not drink alcohol.  ROS: Pertinent ROS in HPI  Physical Exam: There were no vitals taken for this visit.  Constitutional:  Well nourished. Alert and oriented, No acute distress. HEENT: [place] AT, moist mucus membranes.  Trachea  midline, no masses. Cardiovascular: No clubbing, cyanosis, or edema. Respiratory: Normal respiratory effort, no increased work of breathing. GU: No CVA tenderness.  No bladder fullness or masses.  Recession of labia minora, dry, pale vulvar vaginal mucosa and loss of mucosal ridges and folds.  Normal urethral meatus, no lesions, no prolapse, no discharge.   No urethral masses, tenderness and/or tenderness. No bladder fullness, tenderness or masses. *** vagina mucosa, *** estrogen effect, no discharge, no lesions, *** pelvic support, *** cystocele and *** rectocele noted.  No cervical motion tenderness.  Uterus is freely mobile and non-fixed.  No adnexal/parametria masses or tenderness noted.  Anus and perineum are without rashes or lesions.   ***  Neurologic: Grossly intact, no focal deficits, moving all 4 extremities. Psychiatric: Normal mood and affect.    Laboratory Data: See Epic  and HPI   I have reviewed the labs.   Pertinent Imaging: N/A  Assessment & Plan:  ***  1. Gross hematuria  - UA ***  No follow-ups on file.  These notes generated with voice recognition software. I apologize for typographical errors.  CLOTILDA CORNWALL, PA-C  [facility] [address]  [address] [place], [place] [address] [phone]     [1]  Allergies Allergen Reactions   Amoxicillin-Pot Clavulanate Nausea And Vomiting   Bee Venom Anaphylaxis    As a child   Mirabegron  Cough, Other (See Comments) and Shortness Of Breath    SOB, sore throat, cough, tremor, upset stomachs   Aspirin  Other (See Comments)    Abdominal Pain   Ciprofloxacin Nausea And Vomiting   Tramadol Nausea And Vomiting   Vicodin [Hydrocodone-Acetaminophen ] Itching   "

## 2024-08-21 ENCOUNTER — Ambulatory Visit: Admitting: Urology

## 2024-08-21 ENCOUNTER — Encounter: Payer: Self-pay | Admitting: Urology

## 2024-08-21 VITALS — BP 131/85 | HR 91 | Wt 102.0 lb

## 2024-08-21 DIAGNOSIS — R310 Gross hematuria: Secondary | ICD-10-CM

## 2024-08-21 MED ORDER — NITROFURANTOIN MONOHYD MACRO 100 MG PO CAPS: 100.0000 mg | ORAL_CAPSULE | Freq: Two times a day (BID) | ORAL | 0 refills | Status: AC

## 2024-08-22 LAB — URINALYSIS, COMPLETE
Bilirubin, UA: NEGATIVE
Glucose, UA: NEGATIVE
Ketones, UA: NEGATIVE
Nitrite, UA: NEGATIVE
Specific Gravity, UA: <1.005 — ABNORMAL LOW (ref 1.005–1.030)
Urobilinogen, Ur: 0.2 mg/dL (ref 0.2–1.0)
pH, UA: 5.5 (ref 5.0–7.5)

## 2024-08-22 LAB — MICROSCOPIC EXAMINATION: WBC, UA: >30 /HPF — AB (ref 0–5)

## 2024-08-26 ENCOUNTER — Other Ambulatory Visit

## 2024-09-15 ENCOUNTER — Ambulatory Visit: Admitting: Family

## 2024-12-02 ENCOUNTER — Ambulatory Visit: Admitting: Physician Assistant
